# Patient Record
Sex: Female | Born: 1940
Health system: Southern US, Community
[De-identification: ages and names within clinical notes are randomized; demographics above are authoritative.]

## PROBLEM LIST (undated history)

## (undated) DIAGNOSIS — M329 Systemic lupus erythematosus, unspecified: Secondary | ICD-10-CM

## (undated) DIAGNOSIS — IMO0002 Reserved for concepts with insufficient information to code with codable children: Secondary | ICD-10-CM

## (undated) DIAGNOSIS — M1990 Unspecified osteoarthritis, unspecified site: Secondary | ICD-10-CM

## (undated) DIAGNOSIS — Z853 Personal history of malignant neoplasm of breast: Secondary | ICD-10-CM

## (undated) DIAGNOSIS — I10 Essential (primary) hypertension: Secondary | ICD-10-CM

## (undated) DIAGNOSIS — I2510 Atherosclerotic heart disease of native coronary artery without angina pectoris: Secondary | ICD-10-CM

## (undated) DIAGNOSIS — J849 Interstitial pulmonary disease, unspecified: Secondary | ICD-10-CM

## (undated) DIAGNOSIS — I2699 Other pulmonary embolism without acute cor pulmonale: Secondary | ICD-10-CM

## (undated) DIAGNOSIS — J45909 Unspecified asthma, uncomplicated: Secondary | ICD-10-CM

## (undated) DIAGNOSIS — M3500 Sicca syndrome, unspecified: Secondary | ICD-10-CM

## (undated) DIAGNOSIS — I82409 Acute embolism and thrombosis of unspecified deep veins of unspecified lower extremity: Secondary | ICD-10-CM

## (undated) DIAGNOSIS — E782 Mixed hyperlipidemia: Secondary | ICD-10-CM

## (undated) DIAGNOSIS — IMO0001 Reserved for inherently not codable concepts without codable children: Secondary | ICD-10-CM

## (undated) DIAGNOSIS — C50919 Malignant neoplasm of unspecified site of unspecified female breast: Secondary | ICD-10-CM

## (undated) DIAGNOSIS — K219 Gastro-esophageal reflux disease without esophagitis: Secondary | ICD-10-CM

## (undated) DIAGNOSIS — Z789 Other specified health status: Secondary | ICD-10-CM

## (undated) DIAGNOSIS — R55 Syncope and collapse: Secondary | ICD-10-CM

## (undated) DIAGNOSIS — E119 Type 2 diabetes mellitus without complications: Secondary | ICD-10-CM

## (undated) DIAGNOSIS — N200 Calculus of kidney: Secondary | ICD-10-CM

## (undated) DIAGNOSIS — R0902 Hypoxemia: Secondary | ICD-10-CM

## (undated) HISTORY — DX: Systemic lupus erythematosus, unspecified: M32.9

## (undated) HISTORY — PX: REPLACEMENT TOTAL KNEE: SUR1224

## (undated) HISTORY — PX: EYE SURGERY: SHX253

## (undated) HISTORY — DX: Other specified health status: Z78.9

## (undated) HISTORY — PX: COLONOSCOPY: SHX174

## (undated) HISTORY — PX: OTHER SURGICAL HISTORY: SHX169

## (undated) HISTORY — PX: CHOLECYSTECTOMY: SHX55

## (undated) HISTORY — PX: APPENDECTOMY: SHX54

## (undated) HISTORY — DX: Syncope and collapse: R55

## (undated) HISTORY — DX: Sjogren syndrome, unspecified: M35.00

## (undated) HISTORY — PX: ABDOMINAL HYSTERECTOMY: SHX81

## (undated) HISTORY — DX: Malignant neoplasm of unspecified site of unspecified female breast: C50.919

## (undated) HISTORY — DX: Reserved for concepts with insufficient information to code with codable children: IMO0002

## (undated) HISTORY — PX: BREAST LUMPECTOMY: SHX2

## (undated) HISTORY — DX: Other pulmonary embolism without acute cor pulmonale: I26.99

## (undated) HISTORY — DX: Mixed hyperlipidemia: E78.2

## (undated) HISTORY — DX: Hypoxemia: R09.02

## (undated) HISTORY — PX: CORONARY ANGIOPLASTY: SHX604

## (undated) HISTORY — DX: Essential (primary) hypertension: I10

## (undated) HISTORY — PX: CARDIAC CATHETERIZATION: SHX172

## (undated) HISTORY — DX: Personal history of malignant neoplasm of breast: Z85.3

## (undated) HISTORY — PX: ESOPHAGOGASTRODUODENOSCOPY: SHX1529

---

## 2015-12-11 ENCOUNTER — Emergency Department (HOSPITAL_COMMUNITY): Payer: Medicare Other

## 2015-12-11 ENCOUNTER — Encounter (HOSPITAL_COMMUNITY): Payer: Self-pay | Admitting: *Deleted

## 2015-12-11 ENCOUNTER — Inpatient Hospital Stay (HOSPITAL_COMMUNITY)
Admission: EM | Admit: 2015-12-11 | Discharge: 2015-12-13 | DRG: 196 | Disposition: A | Discharge status: Home / Self Care | Payer: Medicare Other | Attending: Internal Medicine | Admitting: Internal Medicine

## 2015-12-11 DIAGNOSIS — R0602 Shortness of breath: Secondary | ICD-10-CM | POA: Diagnosis not present

## 2015-12-11 DIAGNOSIS — I2510 Atherosclerotic heart disease of native coronary artery without angina pectoris: Secondary | ICD-10-CM | POA: Diagnosis present

## 2015-12-11 DIAGNOSIS — Z7982 Long term (current) use of aspirin: Secondary | ICD-10-CM

## 2015-12-11 DIAGNOSIS — J9621 Acute and chronic respiratory failure with hypoxia: Secondary | ICD-10-CM | POA: Diagnosis present

## 2015-12-11 DIAGNOSIS — I10 Essential (primary) hypertension: Secondary | ICD-10-CM

## 2015-12-11 DIAGNOSIS — Z885 Allergy status to narcotic agent status: Secondary | ICD-10-CM

## 2015-12-11 DIAGNOSIS — Z955 Presence of coronary angioplasty implant and graft: Secondary | ICD-10-CM

## 2015-12-11 DIAGNOSIS — N390 Urinary tract infection, site not specified: Secondary | ICD-10-CM | POA: Diagnosis present

## 2015-12-11 DIAGNOSIS — J45909 Unspecified asthma, uncomplicated: Secondary | ICD-10-CM | POA: Diagnosis present

## 2015-12-11 DIAGNOSIS — R0902 Hypoxemia: Secondary | ICD-10-CM | POA: Diagnosis present

## 2015-12-11 DIAGNOSIS — K219 Gastro-esophageal reflux disease without esophagitis: Secondary | ICD-10-CM | POA: Diagnosis present

## 2015-12-11 DIAGNOSIS — I82409 Acute embolism and thrombosis of unspecified deep veins of unspecified lower extremity: Secondary | ICD-10-CM | POA: Diagnosis present

## 2015-12-11 DIAGNOSIS — Z8701 Personal history of pneumonia (recurrent): Secondary | ICD-10-CM

## 2015-12-11 DIAGNOSIS — E119 Type 2 diabetes mellitus without complications: Secondary | ICD-10-CM

## 2015-12-11 DIAGNOSIS — J849 Interstitial pulmonary disease, unspecified: Principal | ICD-10-CM | POA: Diagnosis present

## 2015-12-11 DIAGNOSIS — Z7901 Long term (current) use of anticoagulants: Secondary | ICD-10-CM

## 2015-12-11 DIAGNOSIS — Z7951 Long term (current) use of inhaled steroids: Secondary | ICD-10-CM

## 2015-12-11 DIAGNOSIS — M329 Systemic lupus erythematosus, unspecified: Secondary | ICD-10-CM | POA: Diagnosis present

## 2015-12-11 DIAGNOSIS — Z86718 Personal history of other venous thrombosis and embolism: Secondary | ICD-10-CM

## 2015-12-11 DIAGNOSIS — Z7984 Long term (current) use of oral hypoglycemic drugs: Secondary | ICD-10-CM

## 2015-12-11 DIAGNOSIS — J40 Bronchitis, not specified as acute or chronic: Secondary | ICD-10-CM | POA: Diagnosis not present

## 2015-12-11 DIAGNOSIS — Z888 Allergy status to other drugs, medicaments and biological substances status: Secondary | ICD-10-CM

## 2015-12-11 DIAGNOSIS — Z79899 Other long term (current) drug therapy: Secondary | ICD-10-CM

## 2015-12-11 DIAGNOSIS — Z882 Allergy status to sulfonamides status: Secondary | ICD-10-CM

## 2015-12-11 HISTORY — DX: Acute embolism and thrombosis of unspecified deep veins of unspecified lower extremity: I82.409

## 2015-12-11 HISTORY — DX: Essential (primary) hypertension: I10

## 2015-12-11 HISTORY — DX: Type 2 diabetes mellitus without complications: E11.9

## 2015-12-11 HISTORY — DX: Atherosclerotic heart disease of native coronary artery without angina pectoris: I25.10

## 2015-12-11 LAB — URINE MICROSCOPIC-ADD ON

## 2015-12-11 LAB — COMPREHENSIVE METABOLIC PANEL
ALT: 13 U/L — ABNORMAL LOW (ref 14–54)
AST: 14 U/L — ABNORMAL LOW (ref 15–41)
Albumin: 3.4 g/dL — ABNORMAL LOW (ref 3.5–5.0)
Alkaline Phosphatase: 52 U/L (ref 38–126)
Anion gap: 10 (ref 5–15)
BUN: 13 mg/dL (ref 6–20)
CO2: 24 mmol/L (ref 22–32)
Calcium: 9 mg/dL (ref 8.9–10.3)
Chloride: 103 mmol/L (ref 101–111)
Creatinine, Ser: 0.74 mg/dL (ref 0.44–1.00)
GFR calc Af Amer: >60 mL/min (ref >60)
GFR calc non Af Amer: >60 mL/min (ref >60)
Glucose, Bld: 124 mg/dL — ABNORMAL HIGH (ref 65–99)
Potassium: 3.4 mmol/L — ABNORMAL LOW (ref 3.5–5.1)
Sodium: 137 mmol/L (ref 135–145)
Total Bilirubin: 0.6 mg/dL (ref 0.3–1.2)
Total Protein: 6.7 g/dL (ref 6.5–8.1)

## 2015-12-11 LAB — CBC WITH DIFFERENTIAL/PLATELET
Basophils Absolute: 0 10*3/uL (ref 0.0–0.1)
Basophils Relative: 0 %
Eosinophils Absolute: 0.2 10*3/uL (ref 0.0–0.7)
Eosinophils Relative: 1 %
HCT: 34.2 % — ABNORMAL LOW (ref 36.0–46.0)
Hemoglobin: 11.9 g/dL — ABNORMAL LOW (ref 12.0–15.0)
Lymphocytes Relative: 6 %
Lymphs Abs: 0.7 10*3/uL (ref 0.7–4.0)
MCH: 31.4 pg (ref 26.0–34.0)
MCHC: 34.8 g/dL (ref 30.0–36.0)
MCV: 90.2 fL (ref 78.0–100.0)
Monocytes Absolute: 1 10*3/uL (ref 0.1–1.0)
Monocytes Relative: 10 %
Neutro Abs: 8.8 10*3/uL — ABNORMAL HIGH (ref 1.7–7.7)
Neutrophils Relative %: 83 %
Platelets: 258 10*3/uL (ref 150–400)
RBC: 3.79 MIL/uL — ABNORMAL LOW (ref 3.87–5.11)
RDW: 13.2 % (ref 11.5–15.5)
WBC: 10.7 10*3/uL — ABNORMAL HIGH (ref 4.0–10.5)

## 2015-12-11 LAB — URINALYSIS, ROUTINE W REFLEX MICROSCOPIC
Bilirubin Urine: NEGATIVE
Glucose, UA: NEGATIVE mg/dL
Hgb urine dipstick: NEGATIVE
Ketones, ur: NEGATIVE mg/dL
Nitrite: NEGATIVE
Protein, ur: NEGATIVE mg/dL
Specific Gravity, Urine: 1.016 (ref 1.005–1.030)
pH: 5.5 (ref 5.0–8.0)

## 2015-12-11 LAB — I-STAT CG4 LACTIC ACID, ED
Lactic Acid, Venous: 1.07 mmol/L (ref 0.5–2.0)
Lactic Acid, Venous: 0.91 mmol/L (ref 0.5–2.0)

## 2015-12-11 LAB — I-STAT TROPONIN, ED: Troponin i, poc: 0.03 ng/mL (ref 0.00–0.08)

## 2015-12-11 LAB — CBG MONITORING, ED: Glucose-Capillary: 121 mg/dL — ABNORMAL HIGH (ref 65–99)

## 2015-12-11 LAB — LIPASE, BLOOD: Lipase: 30 U/L (ref 11–51)

## 2015-12-11 MED ORDER — METHYLPREDNISOLONE SODIUM SUCC 125 MG IJ SOLR
125.0000 mg | Freq: Once | INTRAMUSCULAR | Status: AC
  Administered 2015-12-11: 125 mg via INTRAVENOUS
  Filled 2015-12-11: qty 2

## 2015-12-11 MED ORDER — IPRATROPIUM-ALBUTEROL 0.5-2.5 (3) MG/3ML IN SOLN
3.0000 mL | Freq: Once | RESPIRATORY_TRACT | Status: AC
  Administered 2015-12-11: 3 mL via RESPIRATORY_TRACT
  Filled 2015-12-11: qty 3

## 2015-12-11 MED ORDER — IOPAMIDOL (ISOVUE-300) INJECTION 61%
100.0000 mL | Freq: Once | INTRAVENOUS | Status: AC | PRN
  Administered 2015-12-11: 100 mL via INTRAVENOUS

## 2015-12-11 MED ORDER — ONDANSETRON HCL 4 MG/2ML IJ SOLN
4.0000 mg | Freq: Once | INTRAMUSCULAR | Status: AC
  Administered 2015-12-11: 4 mg via INTRAVENOUS
  Filled 2015-12-11: qty 2

## 2015-12-11 MED ORDER — DEXTROSE 5 % IV SOLN
1.0000 g | Freq: Once | INTRAVENOUS | Status: AC
  Administered 2015-12-11: 1 g via INTRAVENOUS
  Filled 2015-12-11: qty 10

## 2015-12-11 MED ORDER — AZITHROMYCIN 500 MG IV SOLR
500.0000 mg | Freq: Once | INTRAVENOUS | Status: AC
  Administered 2015-12-11: 500 mg via INTRAVENOUS
  Filled 2015-12-11: qty 500

## 2015-12-11 MED ORDER — INSULIN ASPART 100 UNIT/ML ~~LOC~~ SOLN
0.0000 [IU] | Freq: Three times a day (TID) | SUBCUTANEOUS | Status: DC
  Administered 2015-12-12: 3 [IU] via SUBCUTANEOUS
  Administered 2015-12-12 (×2): 2 [IU] via SUBCUTANEOUS
  Administered 2015-12-13: 1 [IU] via SUBCUTANEOUS

## 2015-12-11 MED ORDER — DIATRIZOATE MEGLUMINE & SODIUM 66-10 % PO SOLN
15.0000 mL | Freq: Once | ORAL | Status: AC
  Administered 2015-12-11: 15 mL via ORAL

## 2015-12-11 MED ORDER — INSULIN ASPART 100 UNIT/ML ~~LOC~~ SOLN: 0.0000 [IU] | Freq: Every day | SUBCUTANEOUS | Status: DC

## 2015-12-11 MED ORDER — SODIUM CHLORIDE 0.9 % IV BOLUS (SEPSIS)
1000.0000 mL | Freq: Once | INTRAVENOUS | Status: AC
  Administered 2015-12-11: 1000 mL via INTRAVENOUS

## 2015-12-11 NOTE — ED Notes (Signed)
Pt ambulated for about 15 feet. O2 stats stayed between 84% and 90%.  Pt stated that she "felt short of breath".

## 2015-12-11 NOTE — ED Notes (Signed)
Pt complains of cough, abdominal pain, and fatigue. Pt states she was diagnosed with pneumonia in February and feels she has not gotten over it yet. Pt states she saw her GI doctor for left side abdominal pain and was told she had an ulcer. Pt states she also noticed blood in her urine 10 days ago and was put on cipro.

## 2015-12-11 NOTE — H&P (Signed)
[facility] Admission History and Physical       Stacy Contreras:[id number] DOB: 13-Aug-1940 DOA: 12/11/2015  Referring physician: EDP PCP:  [facility]:   Chief Complaint: Increased SOB  HPI: Stacy Contreras is a 75 y.o. female with a history of CAD, HTN, DM2, DVT, and GERD who presents to the ED with complaints of increased SOB and Cough x 5-6 months.  She has also had recent Epigastric Pain as well and was prescribed Dexilant Rx which only partially relieves her symptoms.   She was evaluated in the ED tonight and was found to have hypoxemia with O2 sats of 84%, and was placed on NCO2.   A CT scan of the chest and ABD were performed and revealed findings of Interstitial ground glass opacities in both lungs.    The findings were discussed with Pulmonary on call who recommended empiric antibiotics and IV Steroid therapy and they will see her in the AM for consultation. She was referred for admission.       Patient reports being ill with SOB and Cough since the end of December 2016, and she was diagnosed with Pneumonia by her PCP and prescribed Amoxicillin.   She reports that she only partially improved. She also had been having Epigastric Pain and Reflux symptoms and had a GI evaluation Upper Endoscopy and Colonoscopy and was placed on Dexilant Rx.      Review of Systems:  Constitutional: No Weight Loss, No Weight Gain, Night Sweats, Fevers, Chills, Dizziness, Light Headedness, Fatigue, or Generalized Weakness HEENT: No Headaches, Difficulty Swallowing,Tooth/Dental Problems,Sore Throat,  No Sneezing, Rhinitis, Ear Ache, Nasal Congestion, or Post Nasal Drip,  Cardio-vascular:  No Chest pain, Orthopnea, PND, Edema in Lower Extremities, Anasarca, Dizziness, Palpitations  Resp: +Dyspnea, No DOE, +Non-Productive Cough, No Hemoptysis, No Wheezing.    GI: No Heartburn, Indigestion, +Epigastric Abdominal Pain, Nausea, Vomiting, Diarrhea, Constipation, Hematemesis, Hematochezia,  Melena, Change in Bowel Habits,  Loss of Appetite  GU: No Dysuria, No Change in Color of Urine, No Urgency or Urinary Frequency, No Flank pain.  Musculoskeletal: No Joint Pain or Swelling, No Decreased Range of Motion, No Back Pain.  Neurologic: No Syncope, No Seizures, Muscle Weakness, Paresthesia, Vision Disturbance or Loss, No Diplopia, No Vertigo, No Difficulty Walking,  Skin: No Rash or Lesions. Psych: No Change in Mood or Affect, No Depression or Anxiety, No Memory loss, No Confusion, or Hallucinations   Past Medical History  Diagnosis Date  . Diabetes mellitus without complication (HCC)   . Coronary artery disease   . Hypertension   . DVT (deep venous thrombosis) (HCC)      History reviewed. No pertinent past surgical history.    Prior to Admission medications   Medication Sig Start Date End Date Taking? Authorizing Provider  albuterol (PROVENTIL HFA;VENTOLIN HFA) 108 (90 Base) MCG/ACT inhaler Inhale 2 puffs into the lungs every 4 (four) hours as needed for wheezing or shortness of breath.   Yes Historical Provider, MD  amLODipine (NORVASC) 5 MG tablet Take 5 mg by mouth daily.   Yes Historical Provider, MD  apixaban (ELIQUIS) 5 MG TABS tablet Take 2.5 mg by mouth 2 (two) times daily.   Yes Historical Provider, MD  aspirin EC 81 MG tablet Take 81 mg by mouth daily.   Yes Historical Provider, MD  atenolol (TENORMIN) 25 MG tablet Take 25 mg by mouth 2 (two) times daily.   Yes Historical Provider, MD  Black Cohosh 40 MG CAPS Take 120 mg  by mouth 2 (two) times daily.   Yes Historical Provider, MD  dexlansoprazole (DEXILANT) 60 MG capsule Take 60 mg by mouth daily.   Yes Historical Provider, MD  docusate sodium (COLACE) 100 MG capsule Take 200 mg by mouth daily as needed for mild constipation.   Yes Historical Provider, MD  Fluticasone-Salmeterol (ADVAIR) 250-50 MCG/DOSE AEPB Inhale 1 puff into the lungs 2 (two) times daily.   Yes Historical Provider, MD  gabapentin (NEURONTIN) 300  MG capsule Take 600 mg by mouth every evening.   Yes Historical Provider, MD  isosorbide mononitrate (IMDUR) 30 MG 24 hr tablet Take 30 mg by mouth daily.   Yes Historical Provider, MD  losartan (COZAAR) 100 MG tablet Take 100 mg by mouth at bedtime.   Yes Historical Provider, MD  lubiprostone (AMITIZA) 24 MCG capsule Take 24 mcg by mouth 2 (two) times daily with a meal.   Yes Historical Provider, MD  metFORMIN (GLUCOPHAGE) 500 MG tablet Take 1,000 mg by mouth 2 (two) times daily with a meal.   Yes Historical Provider, MD  PARoxetine (PAXIL) 20 MG tablet Take 10 mg by mouth daily.   Yes Historical Provider, MD  sitaGLIPtin (JANUVIA) 100 MG tablet Take 100 mg by mouth every evening.   Yes Historical Provider, MD  zolpidem (AMBIEN) 10 MG tablet Take 10 mg by mouth at bedtime as needed for sleep.   Yes Historical Provider, MD     Allergies  Allergen Reactions  . Codeine Other (See Comments)    Fast heart beat  . Statins     Muscle weakness  . Sulfa Antibiotics Rash    Social History:  reports that she has never smoked. She does not have any smokeless tobacco history on file. She reports that she does not drink alcohol. Her drug history is not on file.     No family history on file.     Physical Exam:  GEN:  Pleasant Well Nourished and Well Developed  75 y.o. Caucasian female examined and in no acute distress; cooperative with exam Filed Vitals:   12/11/15 1805 12/11/15 1910 12/11/15 2058 12/11/15 2248  BP: 129/63 133/56 135/65 142/70  Pulse: 76 81 78 84  Temp: 98.1 F (36.7 C) 98.8 F (37.1 C)    TempSrc:  Oral    Resp: 27 18 26 21   Height: 5\' 5"  (1.651 m)     Weight: 65.46 kg (144 lb 5 oz)     SpO2: 93% 100% 95% 97%   Blood pressure 142/70, pulse 84, temperature 98.8 F (37.1 C), temperature source Oral, resp. rate 21, height 5\' 5"  (1.651 m), weight 65.46 kg (144 lb 5 oz), SpO2 97 %. PSYCH: She is alert and oriented x4; does not appear anxious does not appear depressed;  affect is normal HEENT: Normocephalic and Atraumatic, Mucous membranes pink; PERRLA; EOM intact; Fundi:  Benign;  No scleral icterus, Nares: Patent, Oropharynx: Clear,  Fair Dentition,    Neck:  FROM, No Cervical Lymphadenopathy nor Thyromegaly or Carotid Bruit; No JVD; Breasts:: Not examined CHEST WALL: No tenderness CHEST: Normal respiration, clear to auscultation bilaterally HEART: Regular rate and rhythm; no murmurs rubs or gallops BACK: No kyphosis or scoliosis; No CVA tenderness ABDOMEN: Positive Bowel Sounds, Soft Non-Tender, No Rebound or Guarding; No Masses, No Organomegaly Rectal Exam: Not done EXTREMITIES: No Cyanosis, Clubbing, or Edema; No Ulcerations. Genitalia: not examined PULSES: 2+ and symmetric SKIN: Normal hydration no rash or ulceration CNS:  Alert and Oriented x 4, No Focal  Deficits Vascular: pulses palpable throughout    Labs on Admission:  Basic Metabolic Panel:  Recent Labs Lab 12/11/15 1806  NA 137  K 3.4*  CL 103  CO2 24  GLUCOSE 124*  BUN 13  CREATININE 0.74  CALCIUM 9.0   Liver Function Tests:  Recent Labs Lab 12/11/15 1806  AST 14*  ALT 13*  ALKPHOS 52  BILITOT 0.6  PROT 6.7  ALBUMIN 3.4*    Recent Labs Lab 12/11/15 1805  LIPASE 30   No results for input(s): AMMONIA in the last 168 hours. CBC:  Recent Labs Lab 12/11/15 1805  WBC 10.7*  NEUTROABS 8.8*  HGB 11.9*  HCT 34.2*  MCV 90.2  PLT 258   Cardiac Enzymes: No results for input(s): CKTOTAL, CKMB, CKMBINDEX, TROPONINI in the last 168 hours.  BNP (last 3 results) No results for input(s): BNP in the last 8760 hours.  ProBNP (last 3 results) No results for input(s): PROBNP in the last 8760 hours.  CBG: No results for input(s): GLUCAP in the last 168 hours.  Radiological Exams on Admission: Dg Chest 2 View  12/11/2015  CLINICAL DATA:  Pneumonia. Productive cough and shortness of breath. History of asthma. EXAM: CHEST  2 VIEW COMPARISON:  None. FINDINGS: Normal  cardiac silhouette and mediastinal contours with atherosclerotic plaque within the thoracic aorta. Calcified lymph nodes are noted about the bilateral hila, left greater than right with scattered bilateral punctate granuloma. There is mild diffuse slightly nodular thickening of the pulmonary interstitium. No focal airspace opacities. There is minimal pleural parenchymal thickening about the right minor and bilateral major fissures. No pleural effusion or pneumothorax. No evidence of edema. No acute osseus abnormalities. Post cholecystectomy. IMPRESSION: 1. Findings suggestive of airways disease / bronchitis. No focal airspace opacities to suggest pneumonia. 2. Sequela of prior granulomatous infection. Electronically Signed   By: Simonne Come M.D.   On: 12/11/2015 17:47   Ct Chest W Contrast  12/11/2015  CLINICAL DATA:  75 year old with approximate 3 month history of cough and recent onset of left-sided abdominal pain and hematuria. EXAM: CT CHEST, ABDOMEN, AND PELVIS WITH CONTRAST TECHNIQUE: Multidetector CT imaging of the chest, abdomen and pelvis was performed following the standard protocol during bolus administration of intravenous contrast. CONTRAST:  ISOVUE-300 IOPAMIDOL 61% IV. Oral contrast was also administered. COMPARISON:  None. FINDINGS: CT CHEST FINDINGS Cardiovascular: Heart mildly enlarged. Severe LAD coronary atherosclerosis. No pericardial effusion. Severe atherosclerosis involving the thoracic aorta without evidence of aneurysm. Atherosclerosis at the origin of the left subclavian artery without significant stenosis. Mediastinum/Lymph Nodes: Numerous calcified lymph nodes throughout the mediastinum. Noncalcified prevascular lymph nodes of normal size. No pathologic lymphadenopathy. Normal esophagus. No mediastinal masses. Visualized thyroid gland unremarkable. Lungs/Pleura: Ground-glass airspace opacities throughout both lungs, associated with interstitial opacities throughout both lungs  which sure most prominent in the lower lobes. No solid nodules or masses. No pleural effusions. Musculoskeletal: Degenerative disc disease and spondylosis at T9-10. No acute abnormalities. CT ABDOMEN PELVIS FINDINGS Hepatobiliary: Mild diffuse hepatic steatosis without focal hepatic parenchymal abnormality. Gallbladder surgically absent. No biliary ductal dilation. Pancreas: Normal in appearance without evidence of mass, ductal dilation, or inflammation. Spleen: Normal in size and appearance. Adrenals/Urinary Tract: Approximate 1.1 x 1.7 x 1.3 cm low-attenuation nodule arising from the left adrenal gland. Normal right adrenal gland. Bilateral extrarenal pelves. Subcentimeter simple cyst arising from the lower pole of the left kidney. No significant focal parenchymal abnormality involving either kidney. No urinary tract calculi or obstruction. Normal-appearing urinary bladder.  Stomach/Bowel: Stomach normal in appearance for the degree of distention. Normal-appearing small bowel. Mobile cecum positioned in the right upper quadrant. Entire colon decompressed containing liquid stool. Scattered sigmoid colon diverticula without evidence of acute diverticulitis. Appendix not visualized but no pericecal inflammation. Vascular/Lymphatic: Severe aortoiliofemoral atherosclerosis without aneurysm. Visceral arteries atherosclerotic though patent. No pathologic lymphadenopathy. Reproductive: Surgically absent uterus.  No adnexal masses. Other: Numerous pelvic phleboliths. Very small umbilical hernia containing fat. Musculoskeletal: Severe degenerative disc disease L5-S1 with chronic disc protrusion as there is calcification in the posterior annular fibers. IMPRESSION: 1. Ground-glass opacities throughout both lungs associated with interstitial opacities, a nonspecific finding which can be seen in patients with pulmonary edema or with acute pulmonary inflammatory disorders. 2. No acute abnormalities involving the abdomen or  pelvis. 3. 1.7 cm left adrenal nodule, statistically a small adenoma. 4. Sigmoid colon diverticulosis without evidence of acute diverticulitis. 5. Mild diffuse hepatic steatosis. Electronically Signed   By: Hulan Saashomas  Lawrence M.D.   On: 12/11/2015 19:55   Ct Abdomen Pelvis W Contrast  12/11/2015  CLINICAL DATA:  75 year old with approximate 3 month history of cough and recent onset of left-sided abdominal pain and hematuria. EXAM: CT CHEST, ABDOMEN, AND PELVIS WITH CONTRAST TECHNIQUE: Multidetector CT imaging of the chest, abdomen and pelvis was performed following the standard protocol during bolus administration of intravenous contrast. CONTRAST:  100mL ISOVUE-300 IOPAMIDOL 61% IV. Oral contrast was also administered. COMPARISON:  None. FINDINGS: CT CHEST FINDINGS Cardiovascular: Heart mildly enlarged. Severe LAD coronary atherosclerosis. No pericardial effusion. Severe atherosclerosis involving the thoracic aorta without evidence of aneurysm. Atherosclerosis at the origin of the left subclavian artery without significant stenosis. Mediastinum/Lymph Nodes: Numerous calcified lymph nodes throughout the mediastinum. Noncalcified prevascular lymph nodes of normal size. No pathologic lymphadenopathy. Normal esophagus. No mediastinal masses. Visualized thyroid gland unremarkable. Lungs/Pleura: Ground-glass airspace opacities throughout both lungs, associated with interstitial opacities throughout both lungs which sure most prominent in the lower lobes. No solid nodules or masses. No pleural effusions. Musculoskeletal: Degenerative disc disease and spondylosis at T9-10. No acute abnormalities. CT ABDOMEN PELVIS FINDINGS Hepatobiliary: Mild diffuse hepatic steatosis without focal hepatic parenchymal abnormality. Gallbladder surgically absent. No biliary ductal dilation. Pancreas: Normal in appearance without evidence of mass, ductal dilation, or inflammation. Spleen: Normal in size and appearance. Adrenals/Urinary Tract:  Approximate 1.1 x 1.7 x 1.3 cm low-attenuation nodule arising from the left adrenal gland. Normal right adrenal gland. Bilateral extrarenal pelves. Subcentimeter simple cyst arising from the lower pole of the left kidney. No significant focal parenchymal abnormality involving either kidney. No urinary tract calculi or obstruction. Normal-appearing urinary bladder. Stomach/Bowel: Stomach normal in appearance for the degree of distention. Normal-appearing small bowel. Mobile cecum positioned in the right upper quadrant. Entire colon decompressed containing liquid stool. Scattered sigmoid colon diverticula without evidence of acute diverticulitis. Appendix not visualized but no pericecal inflammation. Vascular/Lymphatic: Severe aortoiliofemoral atherosclerosis without aneurysm. Visceral arteries atherosclerotic though patent. No pathologic lymphadenopathy. Reproductive: Surgically absent uterus.  No adnexal masses. Other: Numerous pelvic phleboliths. Very small umbilical hernia containing fat. Musculoskeletal: Severe degenerative disc disease L5-S1 with chronic disc protrusion as there is calcification in the posterior annular fibers. IMPRESSION: 1. Ground-glass opacities throughout both lungs associated with interstitial opacities, a nonspecific finding which can be seen in patients with pulmonary edema or with acute pulmonary inflammatory disorders. 2. No acute abnormalities involving the abdomen or pelvis. 3. 1.7 cm left adrenal nodule, statistically a small adenoma. 4. Sigmoid colon diverticulosis without evidence of acute diverticulitis. 5. Mild diffuse hepatic  steatosis. Electronically Signed   By: Hulan Saas M.D.   On: 12/11/2015 19:55     EKG: Independently reviewed.     Assessment/Plan:      75 y.o. female with  Principal Problem:    SOB (shortness of breath)  Due to Chronic Inflammation, Pulmonary Fibrosis/ILD vs Bronchiectasis   NCO2 PRN   Monitor O2 sats IV Steroids   Empiric IV Rocephin  and Azithromycin   DuoNebs PRN   Pulmonary to see in AM   Active Problems:    Hypoxia- due to Inflammatory Process   NCO2 PRN   Monitor O2 sats      Bronchitis vs CAP   Empiric Antibiotic Rx    UTI (lower urinary tract infection)   Covered by IV Rocephin        Coronary artery disease   Stable   Continue Imdur, Atenolol, Losartan Rx      Diabetes mellitus without complication (HCC)   Hold Oral Hypoglycemic Rx   SSI coverage PRN   Check HbA1C in AM      Hypertension   Continue Amlodipine, Atenolol, and Losartan Rx   Monitor BPs       DVT (deep venous thrombosis) (HCC)   On Eliquis Rx    Code Status:     FULL CODE       Family Communication:   Husband at Bedside   Disposition Plan:    Inpatient Status        Time spent: 78 Minutes      Ron Parker [facility] Pager [phone]   If 7AM -7PM Please Contact the Day Rounding Team MD for [facility]  If 7PM-7AM, Please Contact Night-Floor Coverage  [email or web address] Password [email or web address] 12/11/2015, 11:05 PM     ADDENDUM:   Patient was seen and examined on 12/11/2015

## 2015-12-11 NOTE — ED Notes (Signed)
Made first request for a urine sample, pt unable to provide one at this time. 

## 2015-12-11 NOTE — ED Provider Notes (Signed)
CSN: [id number]     Arrival date & time 12/11/15  1621 History   First MD Initiated Contact with Patient 12/11/15 1725     Chief Complaint  Patient presents with  . Abdominal Pain  . Cough     (Consider location/radiation/quality/duration/timing/severity/associated sxs/prior Treatment) The history is provided by the patient.  Stacy Contreras is a 75 y.o. female hx of DM, HTN, CAD Here presenting with cough, abdominal pain, fatigue. She actually lives at [place] in [place]. She has not been feeling well since December. In February, she was diagnosed with pneumonia and finished a course of amoxicillin. However, patient still has persistent left sided chest pain and occasional shortness of breath. She has been taking her Eliquis. She also has persistent left upper quadrant abdominal pain as well as epigastric pain. Seen her GI doctor up in [place] and had a recent endoscopy that was unremarkable and was thought to have some gastritis so was started on Dexilant. She states that dexilant helps with her nausea but she still has abdominal pain. About 2 weeks ago she had an episode of hematuria and was put on Cipro and the hematuria resolved. For the last 2 days, patient has some low-grade temperature and subjective chills and wanted to come down here for evaluation. No recent CT scans or chest x-rays done.           Past Medical History  Diagnosis Date  . Diabetes mellitus without complication (HCC)   . Coronary artery disease   . Hypertension   . DVT (deep venous thrombosis) (HCC)    History reviewed. No pertinent past surgical history. No family history on file. Social History  Substance Use Topics  . Smoking status: Never Smoker   . Smokeless tobacco: None  . Alcohol Use: No   OB History    No data available     Review of Systems  Respiratory: Positive for cough.   Gastrointestinal: Positive for abdominal pain.  Neurological: Positive for weakness.  All other systems reviewed and  are negative.     Allergies  Codeine; Statins; and Sulfa antibiotics  Home Medications   Prior to Admission medications   Medication Sig Start Date End Date Taking? Authorizing Provider  albuterol (PROVENTIL HFA;VENTOLIN HFA) 108 (90 Base) MCG/ACT inhaler Inhale 2 puffs into the lungs every 4 (four) hours as needed for wheezing or shortness of breath.   Yes Historical Provider, MD  amLODipine (NORVASC) 5 MG tablet Take 5 mg by mouth daily.   Yes Historical Provider, MD  apixaban (ELIQUIS) 5 MG TABS tablet Take 2.5 mg by mouth 2 (two) times daily.   Yes Historical Provider, MD  aspirin EC 81 MG tablet Take 81 mg by mouth daily.   Yes Historical Provider, MD  atenolol (TENORMIN) 25 MG tablet Take 25 mg by mouth 2 (two) times daily.   Yes Historical Provider, MD  Black Cohosh 40 MG CAPS Take 120 mg by mouth 2 (two) times daily.   Yes Historical Provider, MD  dexlansoprazole (DEXILANT) 60 MG capsule Take 60 mg by mouth daily.   Yes Historical Provider, MD  docusate sodium (COLACE) 100 MG capsule Take 200 mg by mouth daily as needed for mild constipation.   Yes Historical Provider, MD  Fluticasone-Salmeterol (ADVAIR) 250-50 MCG/DOSE AEPB Inhale 1 puff into the lungs 2 (two) times daily.   Yes Historical Provider, MD  gabapentin (NEURONTIN) 300 MG capsule Take 600 mg by mouth every evening.   Yes Historical Provider, MD  isosorbide  mononitrate (IMDUR) 30 MG 24 hr tablet Take 30 mg by mouth daily.   Yes Historical Provider, MD  losartan (COZAAR) 100 MG tablet Take 100 mg by mouth at bedtime.   Yes Historical Provider, MD  lubiprostone (AMITIZA) 24 MCG capsule Take 24 mcg by mouth 2 (two) times daily with a meal.   Yes Historical Provider, MD  metFORMIN (GLUCOPHAGE) 500 MG tablet Take 1,000 mg by mouth 2 (two) times daily with a meal.   Yes Historical Provider, MD  PARoxetine (PAXIL) 20 MG tablet Take 10 mg by mouth daily.   Yes Historical Provider, MD  sitaGLIPtin (JANUVIA) 100 MG tablet Take 100  mg by mouth every evening.   Yes Historical Provider, MD  zolpidem (AMBIEN) 10 MG tablet Take 10 mg by mouth at bedtime as needed for sleep.   Yes Historical Provider, MD   BP 142/70 mmHg  Pulse 84  Temp(Src) 98.8 F (37.1 C) (Oral)  Resp 21  Ht 5\' 5"  (1.651 m)  Wt 144 lb 5 oz (65.46 kg)  BMI 24.01 kg/m2  SpO2 97% Physical Exam  Constitutional: She is oriented to person, place, and time. She appears well-developed and well-nourished.  HENT:  Head: Normocephalic.  Mouth/Throat: Oropharynx is clear and moist.  Eyes: Conjunctivae are normal. Pupils are equal, round, and reactive to light.  Neck: Normal range of motion. Neck supple.  Cardiovascular: Normal rate, regular rhythm and normal heart sounds.   Pulmonary/Chest: Effort normal.  Diminished L base   Abdominal: Soft. Bowel sounds are normal.  + epigastric and LUQ tenderness, no CVAT   Musculoskeletal: Normal range of motion. She exhibits no edema or tenderness.  Neurological: She is alert and oriented to person, place, and time. No cranial nerve deficit. Coordination normal.  Skin: Skin is warm and dry.  Psychiatric: She has a normal mood and affect. Her behavior is normal. Judgment and thought content normal.  Nursing note and vitals reviewed.   ED Course  Procedures (including critical care time) Labs Review Labs Reviewed  URINALYSIS, ROUTINE W REFLEX MICROSCOPIC (NOT AT [facility]) - Abnormal; Notable for the following:    APPearance CLOUDY (*)    Leukocytes, UA SMALL (*)    All other components within normal limits  CBC WITH DIFFERENTIAL/PLATELET - Abnormal; Notable for the following:    WBC 10.7 (*)    RBC 3.79 (*)    Hemoglobin 11.9 (*)    HCT 34.2 (*)    Neutro Abs 8.8 (*)    All other components within normal limits  COMPREHENSIVE METABOLIC PANEL - Abnormal; Notable for the following:    Potassium 3.4 (*)    Glucose, Bld 124 (*)    Albumin 3.4 (*)    AST 14 (*)    ALT 13 (*)    All other components within normal  limits  URINE MICROSCOPIC-ADD ON - Abnormal; Notable for the following:    Squamous Epithelial / LPF 0-5 (*)    Bacteria, UA RARE (*)    All other components within normal limits  LIPASE, BLOOD  I-STAT TROPOININ, ED  I-STAT CG4 LACTIC ACID, ED  I-STAT CG4 LACTIC ACID, ED    Imaging Review Dg Chest 2 View  12/11/2015  CLINICAL DATA:  Pneumonia. Productive cough and shortness of breath. History of asthma. EXAM: CHEST  2 VIEW COMPARISON:  None. FINDINGS: Normal cardiac silhouette and mediastinal contours with atherosclerotic plaque within the thoracic aorta. Calcified lymph nodes are noted about the bilateral hila, left greater than right with  scattered bilateral punctate granuloma. There is mild diffuse slightly nodular thickening of the pulmonary interstitium. No focal airspace opacities. There is minimal pleural parenchymal thickening about the right minor and bilateral major fissures. No pleural effusion or pneumothorax. No evidence of edema. No acute osseus abnormalities. Post cholecystectomy. IMPRESSION: 1. Findings suggestive of airways disease / bronchitis. No focal airspace opacities to suggest pneumonia. 2. Sequela of prior granulomatous infection. Electronically Signed   By: Simonne Come M.D.   On: 12/11/2015 17:47   Ct Chest W Contrast  12/11/2015  CLINICAL DATA:  75 year old with approximate 3 month history of cough and recent onset of left-sided abdominal pain and hematuria. EXAM: CT CHEST, ABDOMEN, AND PELVIS WITH CONTRAST TECHNIQUE: Multidetector CT imaging of the chest, abdomen and pelvis was performed following the standard protocol during bolus administration of intravenous contrast. CONTRAST:  ISOVUE-300 IOPAMIDOL 61% IV. Oral contrast was also administered. COMPARISON:  None. FINDINGS: CT CHEST FINDINGS Cardiovascular: Heart mildly enlarged. Severe LAD coronary atherosclerosis. No pericardial effusion. Severe atherosclerosis involving the thoracic aorta without evidence of  aneurysm. Atherosclerosis at the origin of the left subclavian artery without significant stenosis. Mediastinum/Lymph Nodes: Numerous calcified lymph nodes throughout the mediastinum. Noncalcified prevascular lymph nodes of normal size. No pathologic lymphadenopathy. Normal esophagus. No mediastinal masses. Visualized thyroid gland unremarkable. Lungs/Pleura: Ground-glass airspace opacities throughout both lungs, associated with interstitial opacities throughout both lungs which sure most prominent in the lower lobes. No solid nodules or masses. No pleural effusions. Musculoskeletal: Degenerative disc disease and spondylosis at T9-10. No acute abnormalities. CT ABDOMEN PELVIS FINDINGS Hepatobiliary: Mild diffuse hepatic steatosis without focal hepatic parenchymal abnormality. Gallbladder surgically absent. No biliary ductal dilation. Pancreas: Normal in appearance without evidence of mass, ductal dilation, or inflammation. Spleen: Normal in size and appearance. Adrenals/Urinary Tract: Approximate 1.1 x 1.7 x 1.3 cm low-attenuation nodule arising from the left adrenal gland. Normal right adrenal gland. Bilateral extrarenal pelves. Subcentimeter simple cyst arising from the lower pole of the left kidney. No significant focal parenchymal abnormality involving either kidney. No urinary tract calculi or obstruction. Normal-appearing urinary bladder. Stomach/Bowel: Stomach normal in appearance for the degree of distention. Normal-appearing small bowel. Mobile cecum positioned in the right upper quadrant. Entire colon decompressed containing liquid stool. Scattered sigmoid colon diverticula without evidence of acute diverticulitis. Appendix not visualized but no pericecal inflammation. Vascular/Lymphatic: Severe aortoiliofemoral atherosclerosis without aneurysm. Visceral arteries atherosclerotic though patent. No pathologic lymphadenopathy. Reproductive: Surgically absent uterus.  No adnexal masses. Other: Numerous pelvic  phleboliths. Very small umbilical hernia containing fat. Musculoskeletal: Severe degenerative disc disease L5-S1 with chronic disc protrusion as there is calcification in the posterior annular fibers. IMPRESSION: 1. Ground-glass opacities throughout both lungs associated with interstitial opacities, a nonspecific finding which can be seen in patients with pulmonary edema or with acute pulmonary inflammatory disorders. 2. No acute abnormalities involving the abdomen or pelvis. 3. 1.7 cm left adrenal nodule, statistically a small adenoma. 4. Sigmoid colon diverticulosis without evidence of acute diverticulitis. 5. Mild diffuse hepatic steatosis. Electronically Signed   By: Hulan Saas M.D.   On: 12/11/2015 19:55   Ct Abdomen Pelvis W Contrast  12/11/2015  CLINICAL DATA:  75 year old with approximate 3 month history of cough and recent onset of left-sided abdominal pain and hematuria. EXAM: CT CHEST, ABDOMEN, AND PELVIS WITH CONTRAST TECHNIQUE: Multidetector CT imaging of the chest, abdomen and pelvis was performed following the standard protocol during bolus administration of intravenous contrast. CONTRAST:  ISOVUE-300 IOPAMIDOL 61% IV. Oral contrast was also administered.  COMPARISON:  None. FINDINGS: CT CHEST FINDINGS Cardiovascular: Heart mildly enlarged. Severe LAD coronary atherosclerosis. No pericardial effusion. Severe atherosclerosis involving the thoracic aorta without evidence of aneurysm. Atherosclerosis at the origin of the left subclavian artery without significant stenosis. Mediastinum/Lymph Nodes: Numerous calcified lymph nodes throughout the mediastinum. Noncalcified prevascular lymph nodes of normal size. No pathologic lymphadenopathy. Normal esophagus. No mediastinal masses. Visualized thyroid gland unremarkable. Lungs/Pleura: Ground-glass airspace opacities throughout both lungs, associated with interstitial opacities throughout both lungs which sure most prominent in the lower lobes. No  solid nodules or masses. No pleural effusions. Musculoskeletal: Degenerative disc disease and spondylosis at T9-10. No acute abnormalities. CT ABDOMEN PELVIS FINDINGS Hepatobiliary: Mild diffuse hepatic steatosis without focal hepatic parenchymal abnormality. Gallbladder surgically absent. No biliary ductal dilation. Pancreas: Normal in appearance without evidence of mass, ductal dilation, or inflammation. Spleen: Normal in size and appearance. Adrenals/Urinary Tract: Approximate 1.1 x 1.7 x 1.3 cm low-attenuation nodule arising from the left adrenal gland. Normal right adrenal gland. Bilateral extrarenal pelves. Subcentimeter simple cyst arising from the lower pole of the left kidney. No significant focal parenchymal abnormality involving either kidney. No urinary tract calculi or obstruction. Normal-appearing urinary bladder. Stomach/Bowel: Stomach normal in appearance for the degree of distention. Normal-appearing small bowel. Mobile cecum positioned in the right upper quadrant. Entire colon decompressed containing liquid stool. Scattered sigmoid colon diverticula without evidence of acute diverticulitis. Appendix not visualized but no pericecal inflammation. Vascular/Lymphatic: Severe aortoiliofemoral atherosclerosis without aneurysm. Visceral arteries atherosclerotic though patent. No pathologic lymphadenopathy. Reproductive: Surgically absent uterus.  No adnexal masses. Other: Numerous pelvic phleboliths. Very small umbilical hernia containing fat. Musculoskeletal: Severe degenerative disc disease L5-S1 with chronic disc protrusion as there is calcification in the posterior annular fibers. IMPRESSION: 1. Ground-glass opacities throughout both lungs associated with interstitial opacities, a nonspecific finding which can be seen in patients with pulmonary edema or with acute pulmonary inflammatory disorders. 2. No acute abnormalities involving the abdomen or pelvis. 3. 1.7 cm left adrenal nodule, statistically a  small adenoma. 4. Sigmoid colon diverticulosis without evidence of acute diverticulitis. 5. Mild diffuse hepatic steatosis. Electronically Signed   By: Hulan Saas M.D.   On: 12/11/2015 19:55   I have personally reviewed and evaluated these images and lab results as part of my medical decision-making.   EKG Interpretation   Date/Time:  Wednesday Dec 11 2015 18:04:02 EDT Ventricular Rate:  73 PR Interval:  161 QRS Duration: 87 QT Interval:  477 QTC Calculation: 526 R Axis:   48 Text Interpretation:  Sinus rhythm Abnormal R-wave progression, early  transition Borderline T abnormalities, anterior leads Prolonged QT  interval No significant change was found Confirmed by YAO  MD, DAVID  ([id number]) on 12/11/2015 6:49:59 PM      MDM   Final diagnoses:  None    Stacy Contreras is a 75 y.o. female here with cough, abdominal pain for several months but chills yesterday. Consider pneumonia vs bronchitis vs intra abdominal process. Will get CXR, CT ab/pel, labs, UA.   10:49 PM Patient desat to 82% on RA while ambulating and gets short of breath. CT showed interstitial opacities. CT ab/pel unremarkable. UA ? UTI. Given ceftriaxone. Consulted Dr. Arsenio Loader from critical care, who recommend azithro and IV steroids. Will admit for hypoxia likely secondary to interstitial lung disease vs atypical pneumonia and pyelo.    Richardean Canal, MD 12/11/15 2251

## 2015-12-12 ENCOUNTER — Observation Stay (HOSPITAL_BASED_OUTPATIENT_CLINIC_OR_DEPARTMENT_OTHER): Payer: Medicare Other

## 2015-12-12 DIAGNOSIS — Z7901 Long term (current) use of anticoagulants: Secondary | ICD-10-CM | POA: Diagnosis not present

## 2015-12-12 DIAGNOSIS — Z86718 Personal history of other venous thrombosis and embolism: Secondary | ICD-10-CM | POA: Diagnosis not present

## 2015-12-12 DIAGNOSIS — Z7984 Long term (current) use of oral hypoglycemic drugs: Secondary | ICD-10-CM | POA: Diagnosis not present

## 2015-12-12 DIAGNOSIS — Z7951 Long term (current) use of inhaled steroids: Secondary | ICD-10-CM | POA: Diagnosis not present

## 2015-12-12 DIAGNOSIS — J849 Interstitial pulmonary disease, unspecified: Principal | ICD-10-CM

## 2015-12-12 DIAGNOSIS — Z882 Allergy status to sulfonamides status: Secondary | ICD-10-CM | POA: Diagnosis not present

## 2015-12-12 DIAGNOSIS — R0902 Hypoxemia: Secondary | ICD-10-CM | POA: Diagnosis present

## 2015-12-12 DIAGNOSIS — Z955 Presence of coronary angioplasty implant and graft: Secondary | ICD-10-CM | POA: Diagnosis not present

## 2015-12-12 DIAGNOSIS — J9621 Acute and chronic respiratory failure with hypoxia: Secondary | ICD-10-CM | POA: Diagnosis present

## 2015-12-12 DIAGNOSIS — N390 Urinary tract infection, site not specified: Secondary | ICD-10-CM | POA: Diagnosis present

## 2015-12-12 DIAGNOSIS — J80 Acute respiratory distress syndrome: Secondary | ICD-10-CM

## 2015-12-12 DIAGNOSIS — I10 Essential (primary) hypertension: Secondary | ICD-10-CM | POA: Diagnosis present

## 2015-12-12 DIAGNOSIS — J40 Bronchitis, not specified as acute or chronic: Secondary | ICD-10-CM | POA: Diagnosis present

## 2015-12-12 DIAGNOSIS — Z7982 Long term (current) use of aspirin: Secondary | ICD-10-CM | POA: Diagnosis not present

## 2015-12-12 DIAGNOSIS — E119 Type 2 diabetes mellitus without complications: Secondary | ICD-10-CM | POA: Diagnosis present

## 2015-12-12 DIAGNOSIS — I2510 Atherosclerotic heart disease of native coronary artery without angina pectoris: Secondary | ICD-10-CM | POA: Diagnosis present

## 2015-12-12 DIAGNOSIS — Z885 Allergy status to narcotic agent status: Secondary | ICD-10-CM | POA: Diagnosis not present

## 2015-12-12 DIAGNOSIS — M329 Systemic lupus erythematosus, unspecified: Secondary | ICD-10-CM | POA: Diagnosis present

## 2015-12-12 DIAGNOSIS — I82409 Acute embolism and thrombosis of unspecified deep veins of unspecified lower extremity: Secondary | ICD-10-CM | POA: Diagnosis present

## 2015-12-12 DIAGNOSIS — Z79899 Other long term (current) drug therapy: Secondary | ICD-10-CM | POA: Diagnosis not present

## 2015-12-12 DIAGNOSIS — Z8701 Personal history of pneumonia (recurrent): Secondary | ICD-10-CM | POA: Diagnosis not present

## 2015-12-12 DIAGNOSIS — K219 Gastro-esophageal reflux disease without esophagitis: Secondary | ICD-10-CM | POA: Diagnosis present

## 2015-12-12 DIAGNOSIS — Z888 Allergy status to other drugs, medicaments and biological substances status: Secondary | ICD-10-CM | POA: Diagnosis not present

## 2015-12-12 DIAGNOSIS — R0602 Shortness of breath: Secondary | ICD-10-CM | POA: Diagnosis present

## 2015-12-12 DIAGNOSIS — J45909 Unspecified asthma, uncomplicated: Secondary | ICD-10-CM | POA: Diagnosis present

## 2015-12-12 LAB — CBC
HCT: 34.5 % — ABNORMAL LOW (ref 36.0–46.0)
Hemoglobin: 11.9 g/dL — ABNORMAL LOW (ref 12.0–15.0)
MCH: 31.1 pg (ref 26.0–34.0)
MCHC: 34.5 g/dL (ref 30.0–36.0)
MCV: 90.1 fL (ref 78.0–100.0)
Platelets: 265 10*3/uL (ref 150–400)
RBC: 3.83 MIL/uL — ABNORMAL LOW (ref 3.87–5.11)
RDW: 13.1 % (ref 11.5–15.5)
WBC: 7 10*3/uL (ref 4.0–10.5)

## 2015-12-12 LAB — BASIC METABOLIC PANEL
Anion gap: 10 (ref 5–15)
BUN: 9 mg/dL (ref 6–20)
CO2: 25 mmol/L (ref 22–32)
Calcium: 9 mg/dL (ref 8.9–10.3)
Chloride: 107 mmol/L (ref 101–111)
Creatinine, Ser: 0.68 mg/dL (ref 0.44–1.00)
GFR calc Af Amer: >60 mL/min (ref >60)
GFR calc non Af Amer: >60 mL/min (ref >60)
Glucose, Bld: 207 mg/dL — ABNORMAL HIGH (ref 65–99)
Potassium: 4.3 mmol/L (ref 3.5–5.1)
Sodium: 142 mmol/L (ref 135–145)

## 2015-12-12 LAB — CK TOTAL AND CKMB (NOT AT ARMC)
CK, MB: 8.2 ng/mL — ABNORMAL HIGH (ref 0.5–5.0)
Relative Index: INVALID (ref 0.0–2.5)
Total CK: 13 U/L — ABNORMAL LOW (ref 38–234)

## 2015-12-12 LAB — GLUCOSE, CAPILLARY
Glucose-Capillary: 196 mg/dL — ABNORMAL HIGH (ref 65–99)
Glucose-Capillary: 236 mg/dL — ABNORMAL HIGH (ref 65–99)
Glucose-Capillary: 165 mg/dL — ABNORMAL HIGH (ref 65–99)
Glucose-Capillary: 190 mg/dL — ABNORMAL HIGH (ref 65–99)

## 2015-12-12 LAB — ECHOCARDIOGRAM COMPLETE
Height: 65 in
Weight: 2309 oz

## 2015-12-12 LAB — PROCALCITONIN: Procalcitonin: <0.1 ng/mL

## 2015-12-12 LAB — LACTIC ACID, PLASMA: Lactic Acid, Venous: 1.2 mmol/L (ref 0.5–2.0)

## 2015-12-12 LAB — TROPONIN I: Troponin I: <0.03 ng/mL (ref <0.031)

## 2015-12-12 LAB — BRAIN NATRIURETIC PEPTIDE: B Natriuretic Peptide: 104.4 pg/mL — ABNORMAL HIGH (ref 0.0–100.0)

## 2015-12-12 MED ORDER — PANTOPRAZOLE SODIUM 40 MG PO TBEC: 40.0000 mg | DELAYED_RELEASE_TABLET | Freq: Every day | ORAL | Status: DC

## 2015-12-12 MED ORDER — ONDANSETRON HCL 4 MG/2ML IJ SOLN: 4.0000 mg | Freq: Four times a day (QID) | INTRAMUSCULAR | Status: DC | PRN

## 2015-12-12 MED ORDER — IPRATROPIUM-ALBUTEROL 0.5-2.5 (3) MG/3ML IN SOLN
3.0000 mL | Freq: Two times a day (BID) | RESPIRATORY_TRACT | Status: DC
  Administered 2015-12-12 (×2): 3 mL via RESPIRATORY_TRACT
  Filled 2015-12-12 (×2): qty 3

## 2015-12-12 MED ORDER — AZITHROMYCIN 500 MG IV SOLR
500.0000 mg | INTRAVENOUS | Status: DC
  Filled 2015-12-12: qty 500

## 2015-12-12 MED ORDER — ACETAMINOPHEN 325 MG PO TABS
650.0000 mg | ORAL_TABLET | Freq: Four times a day (QID) | ORAL | Status: DC | PRN
  Administered 2015-12-13: 650 mg via ORAL
  Filled 2015-12-12: qty 2

## 2015-12-12 MED ORDER — ATENOLOL 25 MG PO TABS
25.0000 mg | ORAL_TABLET | Freq: Two times a day (BID) | ORAL | Status: DC
  Administered 2015-12-12 – 2015-12-13 (×4): 25 mg via ORAL
  Filled 2015-12-12 (×5): qty 1

## 2015-12-12 MED ORDER — SODIUM CHLORIDE 0.9% FLUSH: 3.0000 mL | INTRAVENOUS | Status: DC | PRN

## 2015-12-12 MED ORDER — SODIUM CHLORIDE 0.9 % IV SOLN: 250.0000 mL | INTRAVENOUS | Status: DC | PRN

## 2015-12-12 MED ORDER — SODIUM CHLORIDE 0.9% FLUSH
3.0000 mL | Freq: Two times a day (BID) | INTRAVENOUS | Status: DC
  Administered 2015-12-12 (×3): 3 mL via INTRAVENOUS

## 2015-12-12 MED ORDER — DOCUSATE SODIUM 100 MG PO CAPS: 200.0000 mg | ORAL_CAPSULE | Freq: Every day | ORAL | Status: DC | PRN

## 2015-12-12 MED ORDER — AMLODIPINE BESYLATE 5 MG PO TABS
5.0000 mg | ORAL_TABLET | Freq: Every day | ORAL | Status: DC
  Administered 2015-12-12 – 2015-12-13 (×2): 5 mg via ORAL
  Filled 2015-12-12 (×2): qty 1

## 2015-12-12 MED ORDER — ONDANSETRON HCL 4 MG PO TABS: 4.0000 mg | ORAL_TABLET | Freq: Four times a day (QID) | ORAL | Status: DC | PRN

## 2015-12-12 MED ORDER — APIXABAN 2.5 MG PO TABS
2.5000 mg | ORAL_TABLET | Freq: Two times a day (BID) | ORAL | Status: DC
  Administered 2015-12-12 – 2015-12-13 (×4): 2.5 mg via ORAL
  Filled 2015-12-12 (×5): qty 1

## 2015-12-12 MED ORDER — ASPIRIN EC 81 MG PO TBEC
81.0000 mg | DELAYED_RELEASE_TABLET | Freq: Every day | ORAL | Status: DC
  Administered 2015-12-12 – 2015-12-13 (×2): 81 mg via ORAL
  Filled 2015-12-12 (×2): qty 1

## 2015-12-12 MED ORDER — PANTOPRAZOLE SODIUM 40 MG IV SOLR
40.0000 mg | Freq: Two times a day (BID) | INTRAVENOUS | Status: DC
  Administered 2015-12-12 (×3): 40 mg via INTRAVENOUS
  Filled 2015-12-12 (×5): qty 40

## 2015-12-12 MED ORDER — ISOSORBIDE MONONITRATE ER 30 MG PO TB24
30.0000 mg | ORAL_TABLET | Freq: Every day | ORAL | Status: DC
  Administered 2015-12-12 – 2015-12-13 (×3): 30 mg via ORAL
  Filled 2015-12-12 (×3): qty 1

## 2015-12-12 MED ORDER — ZOLPIDEM TARTRATE 5 MG PO TABS
5.0000 mg | ORAL_TABLET | Freq: Every evening | ORAL | Status: DC | PRN
  Administered 2015-12-12 – 2015-12-13 (×2): 5 mg via ORAL
  Filled 2015-12-12 (×2): qty 1

## 2015-12-12 MED ORDER — METHYLPREDNISOLONE SODIUM SUCC 125 MG IJ SOLR
125.0000 mg | Freq: Once | INTRAMUSCULAR | Status: AC
  Administered 2015-12-12: 125 mg via INTRAVENOUS
  Filled 2015-12-12: qty 2

## 2015-12-12 MED ORDER — LOSARTAN POTASSIUM 50 MG PO TABS
100.0000 mg | ORAL_TABLET | Freq: Every day | ORAL | Status: DC
  Administered 2015-12-12 (×2): 100 mg via ORAL
  Filled 2015-12-12 (×3): qty 2

## 2015-12-12 MED ORDER — ZOLPIDEM TARTRATE 10 MG PO TABS: 10.0000 mg | ORAL_TABLET | Freq: Every evening | ORAL | Status: DC | PRN

## 2015-12-12 MED ORDER — DEXTROSE 5 % IV SOLN
1.0000 g | INTRAVENOUS | Status: DC
  Filled 2015-12-12: qty 10

## 2015-12-12 MED ORDER — ACETAMINOPHEN 325 MG PO TABS: 650.0000 mg | ORAL_TABLET | Freq: Four times a day (QID) | ORAL | Status: DC | PRN

## 2015-12-12 MED ORDER — PAROXETINE HCL 10 MG PO TABS
10.0000 mg | ORAL_TABLET | Freq: Every day | ORAL | Status: DC
  Administered 2015-12-12 – 2015-12-13 (×2): 10 mg via ORAL
  Filled 2015-12-12 (×2): qty 1

## 2015-12-12 MED ORDER — IPRATROPIUM-ALBUTEROL 0.5-2.5 (3) MG/3ML IN SOLN: 3.0000 mL | RESPIRATORY_TRACT | Status: DC

## 2015-12-12 MED ORDER — GABAPENTIN 300 MG PO CAPS
600.0000 mg | ORAL_CAPSULE | Freq: Every evening | ORAL | Status: DC
  Administered 2015-12-12 (×2): 600 mg via ORAL
  Filled 2015-12-12 (×3): qty 2

## 2015-12-12 MED ORDER — ACETAMINOPHEN 650 MG RE SUPP: 650.0000 mg | Freq: Four times a day (QID) | RECTAL | Status: DC | PRN

## 2015-12-12 NOTE — Progress Notes (Signed)
  Echocardiogram 2D Echocardiogram has been performed.  Tye SavoyCasey N Francene Mcerlean 12/12/2015, 4:02 PM

## 2015-12-12 NOTE — Consult Note (Signed)
PULMONARY / CRITICAL CARE MEDICINE   Name: Stacy Contreras MRN: [id number] DOB: 11/13/1940    ADMISSION DATE:  12/11/2015 CONSULTATION DATE:  12/12/15  REFERRING MD:  [facility] - Dr Lovell Sheehan and Jomarie Longs  CHIEF COMPLAINT:   - dyspnea, cough, exertional desats  HISTORY OF PRESENT ILLNESS:   - 75 year old female from [place]. 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 Reports in the 1970s she had diagnosis  of systemic lupus erythematosus affecting the kidneys and  the joints. She was seen at [facility] and was on an experiment drug not otherwise specified for 6 months. After that she never had any problems lupus. Then in 2014 she had shortness of breath and had coronary stenting to the LAD but still had class III-for dyspnea and had repeat coronary stenting after which she significantly improved class II dyspnea on exertion which has been stable. She was told at that time she did not have any pneumonia but she does not recollect any imaging at that time. Then in November 2016 started having some abdominal pain and worsening shortness of breath associated with new onset of cough since February 2017. Symptoms been progressive. Class III levels. Cough is dry. Present day and night. She presents to the emergency department at [facility] and was found to be hypoxemic at rest and started on nasal cannula oxygen. At no point she's had a fever. She did get treated for pneumonia in February 2017 but she says she never had any pneumonia symptoms of flu symptoms and therefore the diagnosis of pneumonia was presumed based on his symptoms. She Recollects acid reflux evaluation with endoscopy and placement of ppi treatment.  She denies any exposure to hot tub or Jacuzzi or mold or mildew or feathered pillows. She does not have any birds in the house. She does not work with molasses or in a brewery. She denies any ongoing collagen-vascular disease.  CT scan of the chest showed groundglass  opacities that were scattered.   PAST MEDICAL HISTORY :  She  has a past medical history of Diabetes mellitus without complication (HCC); Coronary artery disease; Hypertension; and DVT (deep venous thrombosis) (HCC).  PAST SURGICAL HISTORY: She  has no past surgical history on file.  Allergies  Allergen Reactions  . Codeine Other (See Comments)    Fast heart beat  . Statins     Muscle weakness  . Sulfa Antibiotics Rash    No current facility-administered medications on file prior to encounter.   No current outpatient prescriptions on file prior to encounter.    FAMILY HISTORY:  Her has no family status information on file.   SOCIAL HISTORY: She  reports that she has never smoked. She does not have any smokeless tobacco history on file. She reports that she does not drink alcohol.  REVIEW OF SYSTEMS:   Detailed 11 point review of systems not possible because of focus history but is as detailed in the history of present illness otherwise negative.  VITAL SIGNS: BP 107/46 mmHg  Pulse 66  Temp(Src) 97.7 F (36.5 C) (Oral)  Resp 18  Ht 5\' 5"  (1.651 m)  Wt 65.46 kg (144 lb 5 oz)  BMI 24.01 kg/m2  SpO2 95%  HEMODYNAMICS:    VENTILATOR SETTINGS:    INTAKE / OUTPUT: I/O last 3 completed shifts: In: -  Out: 250 [Urine:250]  PHYSICAL EXAMINATION: General:  Looks well. Seated on the side of the bed. Neuro:  Alert and oriented 3. Speech normal  sitting normally HEENT:  Nasal cannula oxygen on. No elevated JVP no neck nodes Cardiovascular:  Regular rate and rhythm no murmurs Lungs:  Scattered crackles present no increased work of breathing Abdomen:  Soft nontender Musculoskeletal:  No cyanosis no clubbing no edema. No Raynaud's Skin:  Intact  LABS: PULMONARY No results for input(s): PHART, PCO2ART, PO2ART, HCO3, TCO2, O2SAT in the last 168 hours.  Invalid input(s): PCO2, PO2  CBC  Recent Labs Lab 12/11/15 1805 12/12/15 0432  HGB 11.9* 11.9*  HCT 34.2*  34.5*  WBC 10.7* 7.0  PLT 258 265    COAGULATION No results for input(s): INR in the last 168 hours.  CARDIAC  No results for input(s): TROPONINI in the last 168 hours. No results for input(s): PROBNP in the last 168 hours.   CHEMISTRY  Recent Labs Lab 12/11/15 1806 12/12/15 0432  NA 137 142  K 3.4* 4.3  CL 103 107  CO2 24 25  GLUCOSE 124* 207*  BUN 13 9  CREATININE 0.74 0.68  CALCIUM 9.0 9.0   Estimated Creatinine Clearance: 56.4 mL/min (by C-G formula based on Cr of 0.68).   LIVER  Recent Labs Lab 12/11/15 1806  AST 14*  ALT 13*  ALKPHOS 52  BILITOT 0.6  PROT 6.7  ALBUMIN 3.4*     INFECTIOUS  Recent Labs Lab 12/11/15 1817 12/11/15 2057  LATICACIDVEN 1.07 0.91     ENDOCRINE CBG (last 3)   Recent Labs  12/11/15 2320 12/12/15 0715  GLUCAP 121* 196*         IMAGING x48h  - image(s) personally visualized  -   highlighted in bold Dg Chest 2 View  12/11/2015  CLINICAL DATA:  Pneumonia. Productive cough and shortness of breath. History of asthma. EXAM: CHEST  2 VIEW COMPARISON:  None. FINDINGS: Normal cardiac silhouette and mediastinal contours with atherosclerotic plaque within the thoracic aorta. Calcified lymph nodes are noted about the bilateral hila, left greater than right with scattered bilateral punctate granuloma. There is mild diffuse slightly nodular thickening of the pulmonary interstitium. No focal airspace opacities. There is minimal pleural parenchymal thickening about the right minor and bilateral major fissures. No pleural effusion or pneumothorax. No evidence of edema. No acute osseus abnormalities. Post cholecystectomy. IMPRESSION: 1. Findings suggestive of airways disease / bronchitis. No focal airspace opacities to suggest pneumonia. 2. Sequela of prior granulomatous infection. Electronically Signed   By: Simonne Come M.D.   On: 12/11/2015 17:47   Ct Chest W Contrast  12/11/2015  CLINICAL DATA:  75 year old with approximate 3  month history of cough and recent onset of left-sided abdominal pain and hematuria. EXAM: CT CHEST, ABDOMEN, AND PELVIS WITH CONTRAST TECHNIQUE: Multidetector CT imaging of the chest, abdomen and pelvis was performed following the standard protocol during bolus administration of intravenous contrast. CONTRAST:  ISOVUE-300 IOPAMIDOL 61% IV. Oral contrast was also administered. COMPARISON:  None. FINDINGS: CT CHEST FINDINGS Cardiovascular: Heart mildly enlarged. Severe LAD coronary atherosclerosis. No pericardial effusion. Severe atherosclerosis involving the thoracic aorta without evidence of aneurysm. Atherosclerosis at the origin of the left subclavian artery without significant stenosis. Mediastinum/Lymph Nodes: Numerous calcified lymph nodes throughout the mediastinum. Noncalcified prevascular lymph nodes of normal size. No pathologic lymphadenopathy. Normal esophagus. No mediastinal masses. Visualized thyroid gland unremarkable. Lungs/Pleura: Ground-glass airspace opacities throughout both lungs, associated with interstitial opacities throughout both lungs which sure most prominent in the lower lobes. No solid nodules or masses. No pleural effusions. Musculoskeletal: Degenerative disc disease and spondylosis at T9-10. No  acute abnormalities. CT ABDOMEN PELVIS FINDINGS Hepatobiliary: Mild diffuse hepatic steatosis without focal hepatic parenchymal abnormality. Gallbladder surgically absent. No biliary ductal dilation. Pancreas: Normal in appearance without evidence of mass, ductal dilation, or inflammation. Spleen: Normal in size and appearance. Adrenals/Urinary Tract: Approximate 1.1 x 1.7 x 1.3 cm low-attenuation nodule arising from the left adrenal gland. Normal right adrenal gland. Bilateral extrarenal pelves. Subcentimeter simple cyst arising from the lower pole of the left kidney. No significant focal parenchymal abnormality involving either kidney. No urinary tract calculi or obstruction.  Normal-appearing urinary bladder. Stomach/Bowel: Stomach normal in appearance for the degree of distention. Normal-appearing small bowel. Mobile cecum positioned in the right upper quadrant. Entire colon decompressed containing liquid stool. Scattered sigmoid colon diverticula without evidence of acute diverticulitis. Appendix not visualized but no pericecal inflammation. Vascular/Lymphatic: Severe aortoiliofemoral atherosclerosis without aneurysm. Visceral arteries atherosclerotic though patent. No pathologic lymphadenopathy. Reproductive: Surgically absent uterus.  No adnexal masses. Other: Numerous pelvic phleboliths. Very small umbilical hernia containing fat. Musculoskeletal: Severe degenerative disc disease L5-S1 with chronic disc protrusion as there is calcification in the posterior annular fibers. IMPRESSION: 1. Ground-glass opacities throughout both lungs associated with interstitial opacities, a nonspecific finding which can be seen in patients with pulmonary edema or with acute pulmonary inflammatory disorders. 2. No acute abnormalities involving the abdomen or pelvis. 3. 1.7 cm left adrenal nodule, statistically a small adenoma. 4. Sigmoid colon diverticulosis without evidence of acute diverticulitis. 5. Mild diffuse hepatic steatosis. Electronically Signed   By: Hulan Saas M.D.   On: 12/11/2015 19:55   Ct Abdomen Pelvis W Contrast  12/11/2015  CLINICAL DATA:  75 year old with approximate 3 month history of cough and recent onset of left-sided abdominal pain and hematuria. EXAM: CT CHEST, ABDOMEN, AND PELVIS WITH CONTRAST TECHNIQUE: Multidetector CT imaging of the chest, abdomen and pelvis was performed following the standard protocol during bolus administration of intravenous contrast. CONTRAST:  ISOVUE-300 IOPAMIDOL 61% IV. Oral contrast was also administered. COMPARISON:  None. FINDINGS: CT CHEST FINDINGS Cardiovascular: Heart mildly enlarged. Severe LAD coronary atherosclerosis. No  pericardial effusion. Severe atherosclerosis involving the thoracic aorta without evidence of aneurysm. Atherosclerosis at the origin of the left subclavian artery without significant stenosis. Mediastinum/Lymph Nodes: Numerous calcified lymph nodes throughout the mediastinum. Noncalcified prevascular lymph nodes of normal size. No pathologic lymphadenopathy. Normal esophagus. No mediastinal masses. Visualized thyroid gland unremarkable. Lungs/Pleura: Ground-glass airspace opacities throughout both lungs, associated with interstitial opacities throughout both lungs which sure most prominent in the lower lobes. No solid nodules or masses. No pleural effusions. Musculoskeletal: Degenerative disc disease and spondylosis at T9-10. No acute abnormalities. CT ABDOMEN PELVIS FINDINGS Hepatobiliary: Mild diffuse hepatic steatosis without focal hepatic parenchymal abnormality. Gallbladder surgically absent. No biliary ductal dilation. Pancreas: Normal in appearance without evidence of mass, ductal dilation, or inflammation. Spleen: Normal in size and appearance. Adrenals/Urinary Tract: Approximate 1.1 x 1.7 x 1.3 cm low-attenuation nodule arising from the left adrenal gland. Normal right adrenal gland. Bilateral extrarenal pelves. Subcentimeter simple cyst arising from the lower pole of the left kidney. No significant focal parenchymal abnormality involving either kidney. No urinary tract calculi or obstruction. Normal-appearing urinary bladder. Stomach/Bowel: Stomach normal in appearance for the degree of distention. Normal-appearing small bowel. Mobile cecum positioned in the right upper quadrant. Entire colon decompressed containing liquid stool. Scattered sigmoid colon diverticula without evidence of acute diverticulitis. Appendix not visualized but no pericecal inflammation. Vascular/Lymphatic: Severe aortoiliofemoral atherosclerosis without aneurysm. Visceral arteries atherosclerotic though patent. No pathologic  lymphadenopathy. Reproductive: Surgically absent  uterus.  No adnexal masses. Other: Numerous pelvic phleboliths. Very small umbilical hernia containing fat. Musculoskeletal: Severe degenerative disc disease L5-S1 with chronic disc protrusion as there is calcification in the posterior annular fibers. IMPRESSION: 1. Ground-glass opacities throughout both lungs associated with interstitial opacities, a nonspecific finding which can be seen in patients with pulmonary edema or with acute pulmonary inflammatory disorders. 2. No acute abnormalities involving the abdomen or pelvis. 3. 1.7 cm left adrenal nodule, statistically a small adenoma. 4. Sigmoid colon diverticulosis without evidence of acute diverticulitis. 5. Mild diffuse hepatic steatosis. Electronically Signed   By: Hulan Saashomas  Lawrence M.D.   On: 12/11/2015 19:55       ASSESSMENT / PLAN:  PULMONARY A: Acute on chronic respiratory failure mild with nasal cannula oxygen requirement. In the setting of chronic cough and chronic shortness of breath and new diagnosis DIFFUSE ground glass opacities on high-resolution CT chest. Features suggestive of interstitial lung disease currently idiopathic. CT pattern is not consistent with UIP or IPF  - Differential diagnosis at this point is NSIP , autoimmune, , pulmonary alveolar proteinosis, tonic eosinophilic pneumonia P:   Check autoimmune and vasculitis profile Check angiotensin-converting enzyme for sarcoid Hold off on any steroids for the moment Check erythrocyte sedimentation rate Check echocardiogram Check pulmonary function test Check pro calcitonin, lactic acid and troponin   Based on the above consider bronchoscopy with lavage versus direct surgical lung biopsy especially if workup is negative  She and husband agree with the plan    Dr. Kalman ShanMurali Temara Lanum, M.D., [facility] City[facility]P Pulmonary and Critical Care Medicine Staff Physician [facility] Pager: [phone]  [phone], If no answer or between  15:00h - 7:00h: call [phone]  12/12/2015 9:35 AM

## 2015-12-12 NOTE — Progress Notes (Signed)
PROGRESS NOTE    Stacy Contreras  KWI:[id number] DOB: Apr 14, 1941 DOA: 12/11/2015 PCP: No primary care provider on file.  Outpatient Specialists: Brief Narrative:75 year old female from [place], was told in the 1970s that she had SLE affecting the kidneys and the joints. She was seen at [facility] and was on an experiment drug not otherwise specified for 6 months. After that she never had any problems lupus. Then in 2014 she had shortness of breath and had coronary stenting to the LAD but still had class III-for dyspnea and had repeat coronary stenting after which she significantly improved class II dyspnea on exertion which has been stable.Then in November 2016 started having some abdominal pain and worsening shortness of breath associated with new onset of cough since February 2017. Symptoms been progressive. Class III levels. Cough is dry. Present day and night. She presents to the emergency department at [facility] and was found to be hypoxemic at rest and started on nasal cannula oxygen. Ct chest with interstitial opacities diffusely  Assessment & Plan:     Acute on Chronic Hypoxic resp failure -CT concerning for interstitial lung disease -Pulm consult appreciated -check BNP and ECHO -will FU ESR, ANA, ACE level, Sjogrens Ab -add antiDS Dna -may need Biopsy if above workup not diagnostic -stop Abx, doesn't need CAP coverage    Coronary artery disease -stable, continue ASA/Atenolol    Diabetes mellitus without complication -Hold Oral Hypoglycemic Rx, SSI -FU HbA1C in AM    Hypertension -continue Amlodipine, Atenolol, and Losartan Rx   H/o  DVT (deep venous thrombosis)  -on apixaban   DVT prophylaxis:Lovenox  Code Status:Full Code Family Communication:None at bedside Disposition Plan:Home pending above workup   Consultants:   Pulm  Subjective: Reports dyspnea on exertion  Objective: Filed Vitals:   12/12/15 0141 12/12/15 0514 12/12/15 0828  12/12/15 0926  BP:  107/46  121/65  Pulse:  66  67  Temp:  97.7 F (36.5 C)  98.2 F (36.8 C)  TempSrc:  Oral  Oral  Resp: _0 Height:      Weight:      SpO2: 98% 97% 95% 96%    Intake/Output Summary (Last 24 hours) at 12/12/15 1356 Last data filed at 12/12/15 1100  Gross per 24 hour  Intake    240 ml  Output    650 ml  Net   -410 ml   Filed Weights   12/11/15 1805  Weight: 65.46 kg (144 lb 5 oz)    Examination:  General exam: Appears calm and comfortable  Respiratory system: scattered basilar crackles Cardiovascular system: S1 & S2 heard, RRR. No JVD, murmurs, rubs, gallops or clicks. No pedal edema. Gastrointestinal system: Abdomen is nondistended, soft and nontender. No organomegaly or masses felt. Normal bowel sounds  Central nervous system: Alert and oriented. No focal neurological deficits. Extremities: Symmetric 5 x 5 power. Skin: No rashes, lesions or ulcers Psychiatry: Judgement and insight appear normal. Mood & affect appropriate.     Data Reviewed: I have personally reviewed following labs and imaging studies  CBC:  Recent Labs Lab 12/11/15 1805 12/12/15 0432  WBC 10.7* 7.0  NEUTROABS 8.8*  --   HGB 11.9* 11.9*  HCT 34.2* 34.5*  MCV 90.2 90.1  PLT 258 242   Basic Metabolic Panel:  Recent Labs Lab 12/11/15 1806 12/12/15 0432  NA 137 142  K 3.4* 4.3  CL 103 107  CO2 24 25  GLUCOSE 124* 207*  BUN 13  9  CREATININE 0.74 0.68  CALCIUM 9.0 9.0   GFR: Estimated Creatinine Clearance: 56.4 mL/min (by C-G formula based on Cr of 0.68). Liver Function Tests:  Recent Labs Lab 12/11/15 1806  AST 14*  ALT 13*  ALKPHOS 52  BILITOT 0.6  PROT 6.7  ALBUMIN 3.4*    Recent Labs Lab 12/11/15 1805  LIPASE 30   No results for input(s): AMMONIA in the last 168 hours. Coagulation Profile: No results for input(s): INR, PROTIME in the last 168 hours. Cardiac Enzymes:  Recent Labs Lab 12/12/15 0912  TROPONINI <0.03   BNP (last 3  results) No results for input(s): PROBNP in the last 8760 hours. HbA1C: No results for input(s): HGBA1C in the last 72 hours. CBG:  Recent Labs Lab 12/11/15 2320 12/12/15 0715  GLUCAP 121* 196*   Lipid Profile: No results for input(s): CHOL, HDL, LDLCALC, TRIG, CHOLHDL, LDLDIRECT in the last 72 hours. Thyroid Function Tests: No results for input(s): TSH, T4TOTAL, FREET4, T3FREE, THYROIDAB in the last 72 hours. Anemia Panel: No results for input(s): VITAMINB12, FOLATE, FERRITIN, TIBC, IRON, RETICCTPCT in the last 72 hours. Urine analysis:    Component Value Date/Time   COLORURINE YELLOW 12/11/2015 2003   APPEARANCEUR CLOUDY* 12/11/2015 2003   LABSPEC 1.016 12/11/2015 2003   PHURINE 5.5 12/11/2015 2003   GLUCOSEU NEGATIVE 12/11/2015 2003   HGBUR NEGATIVE 12/11/2015 2003   BILIRUBINUR NEGATIVE 12/11/2015 2003   [place] 12/11/2015 2003   PROTEINUR NEGATIVE 12/11/2015 2003   NITRITE NEGATIVE 12/11/2015 2003   LEUKOCYTESUR SMALL* 12/11/2015 2003   Sepsis Labs: _0 (procalcitonin:4,lacticidven:4)  )No results found for this or any previous visit (from the past 240 hour(s)).       Radiology Studies: Dg Chest 2 View  12/11/2015  CLINICAL DATA:  Pneumonia. Productive cough and shortness of breath. History of asthma. EXAM: CHEST  2 VIEW COMPARISON:  None. FINDINGS: Normal cardiac silhouette and mediastinal contours with atherosclerotic plaque within the thoracic aorta. Calcified lymph nodes are noted about the bilateral hila, left greater than right with scattered bilateral punctate granuloma. There is mild diffuse slightly nodular thickening of the pulmonary interstitium. No focal airspace opacities. There is minimal pleural parenchymal thickening about the right minor and bilateral major fissures. No pleural effusion or pneumothorax. No evidence of edema. No acute osseus abnormalities. Post cholecystectomy. IMPRESSION: 1. Findings suggestive of airways disease /  bronchitis. No focal airspace opacities to suggest pneumonia. 2. Sequela of prior granulomatous infection. Electronically Signed   By: Sandi Mariscal M.D.   On: 12/11/2015 17:47   Ct Chest W Contrast  12/11/2015  CLINICAL DATA:  75 year old with approximate 3 month history of cough and recent onset of left-sided abdominal pain and hematuria. EXAM: CT CHEST, ABDOMEN, AND PELVIS WITH CONTRAST TECHNIQUE: Multidetector CT imaging of the chest, abdomen and pelvis was performed following the standard protocol during bolus administration of intravenous contrast. CONTRAST:  172m ISOVUE-300 IOPAMIDOL 61% IV. Oral contrast was also administered. COMPARISON:  None. FINDINGS: CT CHEST FINDINGS Cardiovascular: Heart mildly enlarged. Severe LAD coronary atherosclerosis. No pericardial effusion. Severe atherosclerosis involving the thoracic aorta without evidence of aneurysm. Atherosclerosis at the origin of the left subclavian artery without significant stenosis. Mediastinum/Lymph Nodes: Numerous calcified lymph nodes throughout the mediastinum. Noncalcified prevascular lymph nodes of normal size. No pathologic lymphadenopathy. Normal esophagus. No mediastinal masses. Visualized thyroid gland unremarkable. Lungs/Pleura: Ground-glass airspace opacities throughout both lungs, associated with interstitial opacities throughout both lungs which sure most prominent in the lower lobes. No solid nodules  or masses. No pleural effusions. Musculoskeletal: Degenerative disc disease and spondylosis at T9-10. No acute abnormalities. CT ABDOMEN PELVIS FINDINGS Hepatobiliary: Mild diffuse hepatic steatosis without focal hepatic parenchymal abnormality. Gallbladder surgically absent. No biliary ductal dilation. Pancreas: Normal in appearance without evidence of mass, ductal dilation, or inflammation. Spleen: Normal in size and appearance. Adrenals/Urinary Tract: Approximate 1.1 x 1.7 x 1.3 cm low-attenuation nodule arising from the left adrenal  gland. Normal right adrenal gland. Bilateral extrarenal pelves. Subcentimeter simple cyst arising from the lower pole of the left kidney. No significant focal parenchymal abnormality involving either kidney. No urinary tract calculi or obstruction. Normal-appearing urinary bladder. Stomach/Bowel: Stomach normal in appearance for the degree of distention. Normal-appearing small bowel. Mobile cecum positioned in the right upper quadrant. Entire colon decompressed containing liquid stool. Scattered sigmoid colon diverticula without evidence of acute diverticulitis. Appendix not visualized but no pericecal inflammation. Vascular/Lymphatic: Severe aortoiliofemoral atherosclerosis without aneurysm. Visceral arteries atherosclerotic though patent. No pathologic lymphadenopathy. Reproductive: Surgically absent uterus.  No adnexal masses. Other: Numerous pelvic phleboliths. Very small umbilical hernia containing fat. Musculoskeletal: Severe degenerative disc disease L5-S1 with chronic disc protrusion as there is calcification in the posterior annular fibers. IMPRESSION: 1. Ground-glass opacities throughout both lungs associated with interstitial opacities, a nonspecific finding which can be seen in patients with pulmonary edema or with acute pulmonary inflammatory disorders. 2. No acute abnormalities involving the abdomen or pelvis. 3. 1.7 cm left adrenal nodule, statistically a small adenoma. 4. Sigmoid colon diverticulosis without evidence of acute diverticulitis. 5. Mild diffuse hepatic steatosis. Electronically Signed   By: Evangeline Dakin M.D.   On: 12/11/2015 19:55   Ct Abdomen Pelvis W Contrast  12/11/2015  CLINICAL DATA:  75 year old with approximate 3 month history of cough and recent onset of left-sided abdominal pain and hematuria. EXAM: CT CHEST, ABDOMEN, AND PELVIS WITH CONTRAST TECHNIQUE: Multidetector CT imaging of the chest, abdomen and pelvis was performed following the standard protocol during bolus  administration of intravenous contrast. CONTRAST:  174m ISOVUE-300 IOPAMIDOL 61% IV. Oral contrast was also administered. COMPARISON:  None. FINDINGS: CT CHEST FINDINGS Cardiovascular: Heart mildly enlarged. Severe LAD coronary atherosclerosis. No pericardial effusion. Severe atherosclerosis involving the thoracic aorta without evidence of aneurysm. Atherosclerosis at the origin of the left subclavian artery without significant stenosis. Mediastinum/Lymph Nodes: Numerous calcified lymph nodes throughout the mediastinum. Noncalcified prevascular lymph nodes of normal size. No pathologic lymphadenopathy. Normal esophagus. No mediastinal masses. Visualized thyroid gland unremarkable. Lungs/Pleura: Ground-glass airspace opacities throughout both lungs, associated with interstitial opacities throughout both lungs which sure most prominent in the lower lobes. No solid nodules or masses. No pleural effusions. Musculoskeletal: Degenerative disc disease and spondylosis at T9-10. No acute abnormalities. CT ABDOMEN PELVIS FINDINGS Hepatobiliary: Mild diffuse hepatic steatosis without focal hepatic parenchymal abnormality. Gallbladder surgically absent. No biliary ductal dilation. Pancreas: Normal in appearance without evidence of mass, ductal dilation, or inflammation. Spleen: Normal in size and appearance. Adrenals/Urinary Tract: Approximate 1.1 x 1.7 x 1.3 cm low-attenuation nodule arising from the left adrenal gland. Normal right adrenal gland. Bilateral extrarenal pelves. Subcentimeter simple cyst arising from the lower pole of the left kidney. No significant focal parenchymal abnormality involving either kidney. No urinary tract calculi or obstruction. Normal-appearing urinary bladder. Stomach/Bowel: Stomach normal in appearance for the degree of distention. Normal-appearing small bowel. Mobile cecum positioned in the right upper quadrant. Entire colon decompressed containing liquid stool. Scattered sigmoid colon  diverticula without evidence of acute diverticulitis. Appendix not visualized but no pericecal inflammation. Vascular/Lymphatic: Severe aortoiliofemoral  atherosclerosis without aneurysm. Visceral arteries atherosclerotic though patent. No pathologic lymphadenopathy. Reproductive: Surgically absent uterus.  No adnexal masses. Other: Numerous pelvic phleboliths. Very small umbilical hernia containing fat. Musculoskeletal: Severe degenerative disc disease L5-S1 with chronic disc protrusion as there is calcification in the posterior annular fibers. IMPRESSION: 1. Ground-glass opacities throughout both lungs associated with interstitial opacities, a nonspecific finding which can be seen in patients with pulmonary edema or with acute pulmonary inflammatory disorders. 2. No acute abnormalities involving the abdomen or pelvis. 3. 1.7 cm left adrenal nodule, statistically a small adenoma. 4. Sigmoid colon diverticulosis without evidence of acute diverticulitis. 5. Mild diffuse hepatic steatosis. Electronically Signed   By: Evangeline Dakin M.D.   On: 12/11/2015 19:55        Scheduled Meds: . amLODipine  5 mg Oral Daily  . apixaban  2.5 mg Oral BID  . aspirin EC  81 mg Oral Daily  . atenolol  25 mg Oral BID  . azithromycin  500 mg Intravenous Q24H  . cefTRIAXone (ROCEPHIN)  IV  1 g Intravenous Q24H  . gabapentin  600 mg Oral QPM  . insulin aspart  0-5 Units Subcutaneous QHS  . insulin aspart  0-9 Units Subcutaneous TID WC  . ipratropium-albuterol  3 mL Nebulization BID  . isosorbide mononitrate  30 mg Oral Daily  . losartan  100 mg Oral QHS  . pantoprazole (PROTONIX) IV  40 mg Intravenous Q12H  . PARoxetine  10 mg Oral Daily  . sodium chloride flush  3 mL Intravenous Q12H   Continuous Infusions:       Time spent: 85mn    PDomenic Polite MD [facility] Pager [phone] If 7PM-7AM, please contact night-coverage [email or web address] Password [email or web address] 12/12/2015, 1:56 PM

## 2015-12-13 ENCOUNTER — Inpatient Hospital Stay (HOSPITAL_COMMUNITY): Payer: Medicare Other

## 2015-12-13 ENCOUNTER — Other Ambulatory Visit (HOSPITAL_COMMUNITY): Payer: Self-pay | Admitting: Respiratory Therapy

## 2015-12-13 DIAGNOSIS — R0602 Shortness of breath: Secondary | ICD-10-CM

## 2015-12-13 LAB — PULMONARY FUNCTION TEST
DL/VA % pred: 77 %
DL/VA: 3.83 ml/min/mmHg/L
DLCO cor % pred: 47 %
DLCO cor: 12.17 ml/min/mmHg
DLCO unc % pred: 45 %
DLCO unc: 11.57 ml/min/mmHg
FEF 25-75 Post: 0.78 L/sec
FEF 25-75 Pre: 1.33 L/sec
FEF2575-%Change-Post: -41 %
FEF2575-%Pred-Post: 43 %
FEF2575-%Pred-Pre: 74 %
FEV1-%Change-Post: -7 %
FEV1-%Pred-Post: 59 %
FEV1-%Pred-Pre: 64 %
FEV1-Post: 1.35 L
FEV1-Pre: 1.47 L
FEV1FVC-%Change-Post: -1 %
FEV1FVC-%Pred-Pre: 104 %
FEV6-%Change-Post: -9 %
FEV6-%Pred-Post: 58 %
FEV6-%Pred-Pre: 64 %
FEV6-Post: 1.68 L
FEV6-Pre: 1.84 L
FEV6FVC-%Change-Post: 0 %
FEV6FVC-%Pred-Post: 104 %
FEV6FVC-%Pred-Pre: 105 %
FVC-%Change-Post: -6 %
FVC-%Pred-Post: 58 %
FVC-%Pred-Pre: 62 %
FVC-Post: 1.74 L
FVC-Pre: 1.86 L
Post FEV1/FVC ratio: 77 %
Post FEV6/FVC ratio: 100 %
Pre FEV1/FVC ratio: 79 %
Pre FEV6/FVC Ratio: 100 %
RV % pred: 94 %
RV: 2.18 L
TLC % pred: 77 %
TLC: 4.02 L

## 2015-12-13 LAB — SEDIMENTATION RATE: Sed Rate: 32 mm/hr — ABNORMAL HIGH (ref 0–22)

## 2015-12-13 LAB — GLUCOSE, CAPILLARY
Glucose-Capillary: 114 mg/dL — ABNORMAL HIGH (ref 65–99)
Glucose-Capillary: 140 mg/dL — ABNORMAL HIGH (ref 65–99)

## 2015-12-13 LAB — HIV ANTIBODY (ROUTINE TESTING W REFLEX): HIV Screen 4th Generation wRfx: NONREACTIVE

## 2015-12-13 LAB — ANTI-DNA ANTIBODY, DOUBLE-STRANDED: ds DNA Ab: <1 IU/mL (ref 0–9)

## 2015-12-13 LAB — HEMOGLOBIN A1C
Hgb A1c MFr Bld: 6.5 % — ABNORMAL HIGH (ref 4.8–5.6)
Mean Plasma Glucose: 140 mg/dL

## 2015-12-13 LAB — PROCALCITONIN: Procalcitonin: <0.1 ng/mL

## 2015-12-13 MED ORDER — APIXABAN 5 MG PO TABS: 2.5000 mg | ORAL_TABLET | Freq: Two times a day (BID) | ORAL | Status: DC

## 2015-12-13 MED ORDER — HYDROCODONE-HOMATROPINE 5-1.5 MG/5ML PO SYRP: 5.0000 mL | ORAL_SOLUTION | Freq: Four times a day (QID) | ORAL | Status: DC | PRN

## 2015-12-13 MED ORDER — ALBUTEROL SULFATE (2.5 MG/3ML) 0.083% IN NEBU
2.5000 mg | INHALATION_SOLUTION | Freq: Once | RESPIRATORY_TRACT | Status: AC
  Administered 2015-12-13: 2.5 mg via RESPIRATORY_TRACT

## 2015-12-13 MED ORDER — PANTOPRAZOLE SODIUM 40 MG PO TBEC
40.0000 mg | DELAYED_RELEASE_TABLET | Freq: Two times a day (BID) | ORAL | Status: DC
  Administered 2015-12-13: 40 mg via ORAL
  Filled 2015-12-13: qty 1

## 2015-12-13 NOTE — Consult Note (Addendum)
PULMONARY / CRITICAL CARE MEDICINE   Name: Stacy Contreras MRN: [id number] DOB: 18-Jul-1941    ADMISSION DATE:  12/11/2015 CONSULTATION DATE:  12/12/15  REFERRING MD:  [facility] - Dr Arnoldo Morale and Broadus John  CHIEF COMPLAINT:   - dyspnea, cough, exertional desats  Brief: - 75 year old female from [place]. Works in the [employer] as a Teacher, music. Reports in the 1970s she had diagnosis  of systemic lupus erythematosus affecting the kidneys and  the joints. She was seen at [facility] and was on an experiment drug not otherwise specified for 6 months. After that she never had any problems lupus. Then in 2014 she had shortness of breath and had coronary stenting to the LAD but still had class III-for dyspnea and had repeat coronary stenting after which she significantly improved class II dyspnea on exertion which has been stable. She was told at that time she did not have any pneumonia but she does not recollect any imaging at that time. Then in November 2016 started having some abdominal pain and worsening shortness of breath associated with new onset of cough since February 2017. Symptoms been progressive. Class III levels. Cough is dry. Present day and night. She presents to the emergency department at [facility] long and was found to be hypoxemic at rest and started on nasal cannula oxygen. At no point she's had a fever. She did get treated for pneumonia in February 2017 but she says she never had any pneumonia symptoms of flu symptoms and therefore the diagnosis of pneumonia was presumed based on his symptoms. She Recollects acid reflux evaluation with endoscopy and placement of ppi treatment.  She denies any exposure to hot tub or Jacuzzi or mold or mildew or feathered pillows. She does not have any birds in the house. She does not work with molasses or in a brewery. She denies any ongoing collagen-vascular disease.  CT scan of the chest showed groundglass opacities that were  scattered.   SUBJECTIVE/OVERNIGHT/INTERVAL HX  12/13/2015  - unchanged. Uneventful night. Feels o2 helps. No fever. ESR 32. Trop normal x 2. PCT normal. BNP norma.l.Echo normal. HIV negative. Autoimmune negative. sTill wtioh cough. PFTs with moderate restriction  VITAL SIGNS: BP 132/62 mmHg  Pulse 55  Temp(Src) 97.9 F (36.6 C) (Oral)  Resp 18  Ht 5' 5"  (1.651 m)  Wt 65.46 kg (144 lb 5 oz)  BMI 24.01 kg/m2  SpO2 100%    INTAKE / OUTPUT: I/O last 3 completed shifts: In: 600 [P.O.:600] Out: 2500 [Urine:2500]  PHYSICAL EXAMINATION: General:  Looks well. Seated on the side of the bed. Neuro:  Alert and oriented 3. Speech normal sitting normally HEENT:  Nasal cannula oxygen on. No elevated JVP no neck nodes Cardiovascular:  Regular rate and rhythm no murmurs Lungs:  Scattered crackles present no increased work of breathing Abdomen:  Soft nontender Musculoskeletal:  No cyanosis no clubbing no edema. No Raynaud's Skin:  Intact  LABS: PULMONARY No results for input(s): PHART, PCO2ART, PO2ART, HCO3, TCO2, O2SAT in the last 168 hours.  Invalid input(s): PCO2, PO2  CBC  Recent Labs Lab 12/11/15 1805 12/12/15 0432  HGB 11.9* 11.9*  HCT 34.2* 34.5*  WBC 10.7* 7.0  PLT 258 265    COAGULATION No results for input(s): INR in the last 168 hours.  CARDIAC    Recent Labs Lab 12/12/15 0912  TROPONINI <0.03   No results for input(s): PROBNP in the last 168 hours.   CHEMISTRY  Recent Labs Lab 12/11/15  1806 12/12/15 0432  NA 137 142  K 3.4* 4.3  CL 103 107  CO2 24 25  GLUCOSE 124* 207*  BUN 13 9  CREATININE 0.74 0.68  CALCIUM 9.0 9.0   Estimated Creatinine Clearance: 56.4 mL/min (by C-G formula based on Cr of 0.68).   LIVER  Recent Labs Lab 12/11/15 1806  AST 14*  ALT 13*  ALKPHOS 52  BILITOT 0.6  PROT 6.7  ALBUMIN 3.4*     INFECTIOUS  Recent Labs Lab 12/11/15 1817 12/11/15 2057 12/12/15 0912 12/12/15 1009 12/13/15 0422  LATICACIDVEN  1.07 0.91 1.2  --   --   PROCALCITON  --   --   --  <0.10 <0.10     ENDOCRINE CBG (last 3)   Recent Labs  12/12/15 1642 12/12/15 2222 12/13/15 0856  GLUCAP 165* 190* 114*         IMAGING x48h  - image(s) personally visualized  -   highlighted in bold Dg Chest 2 View  12/11/2015  CLINICAL DATA:  Pneumonia. Productive cough and shortness of breath. History of asthma. EXAM: CHEST  2 VIEW COMPARISON:  None. FINDINGS: Normal cardiac silhouette and mediastinal contours with atherosclerotic plaque within the thoracic aorta. Calcified lymph nodes are noted about the bilateral hila, left greater than right with scattered bilateral punctate granuloma. There is mild diffuse slightly nodular thickening of the pulmonary interstitium. No focal airspace opacities. There is minimal pleural parenchymal thickening about the right minor and bilateral major fissures. No pleural effusion or pneumothorax. No evidence of edema. No acute osseus abnormalities. Post cholecystectomy. IMPRESSION: 1. Findings suggestive of airways disease / bronchitis. No focal airspace opacities to suggest pneumonia. 2. Sequela of prior granulomatous infection. Electronically Signed   By: Sandi Mariscal M.D.   On: 12/11/2015 17:47   Ct Chest W Contrast  12/11/2015  CLINICAL DATA:  75 year old with approximate 3 month history of cough and recent onset of left-sided abdominal pain and hematuria. EXAM: CT CHEST, ABDOMEN, AND PELVIS WITH CONTRAST TECHNIQUE: Multidetector CT imaging of the chest, abdomen and pelvis was performed following the standard protocol during bolus administration of intravenous contrast. CONTRAST:  144m ISOVUE-300 IOPAMIDOL 61% IV. Oral contrast was also administered. COMPARISON:  None. FINDINGS: CT CHEST FINDINGS Cardiovascular: Heart mildly enlarged. Severe LAD coronary atherosclerosis. No pericardial effusion. Severe atherosclerosis involving the thoracic aorta without evidence of aneurysm. Atherosclerosis at the  origin of the left subclavian artery without significant stenosis. Mediastinum/Lymph Nodes: Numerous calcified lymph nodes throughout the mediastinum. Noncalcified prevascular lymph nodes of normal size. No pathologic lymphadenopathy. Normal esophagus. No mediastinal masses. Visualized thyroid gland unremarkable. Lungs/Pleura: Ground-glass airspace opacities throughout both lungs, associated with interstitial opacities throughout both lungs which sure most prominent in the lower lobes. No solid nodules or masses. No pleural effusions. Musculoskeletal: Degenerative disc disease and spondylosis at T9-10. No acute abnormalities. CT ABDOMEN PELVIS FINDINGS Hepatobiliary: Mild diffuse hepatic steatosis without focal hepatic parenchymal abnormality. Gallbladder surgically absent. No biliary ductal dilation. Pancreas: Normal in appearance without evidence of mass, ductal dilation, or inflammation. Spleen: Normal in size and appearance. Adrenals/Urinary Tract: Approximate 1.1 x 1.7 x 1.3 cm low-attenuation nodule arising from the left adrenal gland. Normal right adrenal gland. Bilateral extrarenal pelves. Subcentimeter simple cyst arising from the lower pole of the left kidney. No significant focal parenchymal abnormality involving either kidney. No urinary tract calculi or obstruction. Normal-appearing urinary bladder. Stomach/Bowel: Stomach normal in appearance for the degree of distention. Normal-appearing small bowel. Mobile cecum positioned in the right upper  quadrant. Entire colon decompressed containing liquid stool. Scattered sigmoid colon diverticula without evidence of acute diverticulitis. Appendix not visualized but no pericecal inflammation. Vascular/Lymphatic: Severe aortoiliofemoral atherosclerosis without aneurysm. Visceral arteries atherosclerotic though patent. No pathologic lymphadenopathy. Reproductive: Surgically absent uterus.  No adnexal masses. Other: Numerous pelvic phleboliths. Very small umbilical  hernia containing fat. Musculoskeletal: Severe degenerative disc disease L5-S1 with chronic disc protrusion as there is calcification in the posterior annular fibers. IMPRESSION: 1. Ground-glass opacities throughout both lungs associated with interstitial opacities, a nonspecific finding which can be seen in patients with pulmonary edema or with acute pulmonary inflammatory disorders. 2. No acute abnormalities involving the abdomen or pelvis. 3. 1.7 cm left adrenal nodule, statistically a small adenoma. 4. Sigmoid colon diverticulosis without evidence of acute diverticulitis. 5. Mild diffuse hepatic steatosis. Electronically Signed   By: Evangeline Dakin M.D.   On: 12/11/2015 19:55   Ct Abdomen Pelvis W Contrast  12/11/2015  CLINICAL DATA:  75 year old with approximate 3 month history of cough and recent onset of left-sided abdominal pain and hematuria. EXAM: CT CHEST, ABDOMEN, AND PELVIS WITH CONTRAST TECHNIQUE: Multidetector CT imaging of the chest, abdomen and pelvis was performed following the standard protocol during bolus administration of intravenous contrast. CONTRAST:  120m ISOVUE-300 IOPAMIDOL 61% IV. Oral contrast was also administered. COMPARISON:  None. FINDINGS: CT CHEST FINDINGS Cardiovascular: Heart mildly enlarged. Severe LAD coronary atherosclerosis. No pericardial effusion. Severe atherosclerosis involving the thoracic aorta without evidence of aneurysm. Atherosclerosis at the origin of the left subclavian artery without significant stenosis. Mediastinum/Lymph Nodes: Numerous calcified lymph nodes throughout the mediastinum. Noncalcified prevascular lymph nodes of normal size. No pathologic lymphadenopathy. Normal esophagus. No mediastinal masses. Visualized thyroid gland unremarkable. Lungs/Pleura: Ground-glass airspace opacities throughout both lungs, associated with interstitial opacities throughout both lungs which sure most prominent in the lower lobes. No solid nodules or masses. No  pleural effusions. Musculoskeletal: Degenerative disc disease and spondylosis at T9-10. No acute abnormalities. CT ABDOMEN PELVIS FINDINGS Hepatobiliary: Mild diffuse hepatic steatosis without focal hepatic parenchymal abnormality. Gallbladder surgically absent. No biliary ductal dilation. Pancreas: Normal in appearance without evidence of mass, ductal dilation, or inflammation. Spleen: Normal in size and appearance. Adrenals/Urinary Tract: Approximate 1.1 x 1.7 x 1.3 cm low-attenuation nodule arising from the left adrenal gland. Normal right adrenal gland. Bilateral extrarenal pelves. Subcentimeter simple cyst arising from the lower pole of the left kidney. No significant focal parenchymal abnormality involving either kidney. No urinary tract calculi or obstruction. Normal-appearing urinary bladder. Stomach/Bowel: Stomach normal in appearance for the degree of distention. Normal-appearing small bowel. Mobile cecum positioned in the right upper quadrant. Entire colon decompressed containing liquid stool. Scattered sigmoid colon diverticula without evidence of acute diverticulitis. Appendix not visualized but no pericecal inflammation. Vascular/Lymphatic: Severe aortoiliofemoral atherosclerosis without aneurysm. Visceral arteries atherosclerotic though patent. No pathologic lymphadenopathy. Reproductive: Surgically absent uterus.  No adnexal masses. Other: Numerous pelvic phleboliths. Very small umbilical hernia containing fat. Musculoskeletal: Severe degenerative disc disease L5-S1 with chronic disc protrusion as there is calcification in the posterior annular fibers. IMPRESSION: 1. Ground-glass opacities throughout both lungs associated with interstitial opacities, a nonspecific finding which can be seen in patients with pulmonary edema or with acute pulmonary inflammatory disorders. 2. No acute abnormalities involving the abdomen or pelvis. 3. 1.7 cm left adrenal nodule, statistically a small adenoma. 4. Sigmoid  colon diverticulosis without evidence of acute diverticulitis. 5. Mild diffuse hepatic steatosis. Electronically Signed   By: TEvangeline DakinM.D.   On: 12/11/2015 19:55  ASSESSMENT / PLAN:  PULMONARY A: Acute on chronic respiratory failure mild with nasal cannula oxygen requirement. In the setting of chronic cough and chronic shortness of breath and new diagnosis DIFFUSE ground glass opacities on high-resolution CT chest. Features suggestive of interstitial lung disease currently idiopathic. CT pattern is not consistent with UIP or IPF  - Differential diagnosis at this point is NSIP , autoimmune, , pulmonary alveolar proteinosis, chronic eosinophilic pneumonia and idiopathich IIP. PFTs with moderate obstrunction - FVC 60%, DLCO 47%  P:   AWait autoimmune and vasculitis profile, angiotensin-converting enzyme for sarcoid Hold steroid rx Will arrange for flexible vidoe  bronchoscopy with lavage  thu 5/117  - RT dept [phone]  - 11am scheduled at [facility] lab  - will need to hold elqiuis (low dose) for 24h - she has had prior hx of massive epistaxis from left nostril that needed OR packing  - will cancel bronch if autoimmune resolts suggests no need for bronch Dc on home o2 nasal cannula Dc abx - TRH to do Hycodan for cough - TRH to do She and husband agree with the plan   OPD fu depending on bronch - she will stay in touch with my office [phone] for any worsening or questions     > 50% of this >35 min visit spent in face to face counseling or coordination of care    Dr. Brand Males, M.D., [facility].C.P Pulmonary and Critical Care Medicine Staff Physician [facility] Pager: [phone], If no answer or between  15:00h - 7:00h: call [phone]  12/13/2015 10:37 AM

## 2015-12-13 NOTE — Progress Notes (Signed)
[facility]    [facility]: Rec'd order for home oxygen.  Per CM, patient's sats were 95% on RA which does not qualify for home oxygen.  If patient discharges after hours, please call [phone].   Renard HamperLecretia Williamson 12/13/2015, 11:53 AM

## 2015-12-13 NOTE — Progress Notes (Signed)
PHARMACIST - PHYSICIAN COMMUNICATION DR:   Jomarie LongsJoseph CONCERNING: IV to Oral Route Change Policy  RECOMMENDATION: This patient is receiving Protonix by the intravenous route.  Based on criteria approved by the Pharmacy and Therapeutics Committee, the intravenous medication(s) is/are being converted to the equivalent oral dose form(s).   DESCRIPTION: These criteria include:  The patient is eating (either orally or via tube) and/or has been taking other orally administered medications for a least 24 hours  The patient has no evidence of active gastrointestinal bleeding or impaired GI absorption (gastrectomy, short bowel, patient on TNA or NPO).   If you have questions about this conversion, please contact the Pharmacy Department  []   [phone]( [phone] )  [facility]   Hiro Vipond PharmD, [place] Pager [phone] 12/13/2015 8:25 AM

## 2015-12-13 NOTE — Discharge Summary (Signed)
Physician Discharge Summary  Stacy Contreras PIR:[id number] DOB: [birth year] DOA: 12/11/2015  PCP: No primary care provider on file.  Admit date: 12/11/2015 Discharge date: 12/13/2015  Time spent: 45 minutes  Recommendations for Outpatient Follow-up:  1. Dr.Ramaswamy on 5/11 for Bronchoscopy and FU, please FU on connective tissue disorder panel   Discharge Diagnoses:    Interstitial Lung disease   Mild Hypoxia   UTI (lower urinary tract infection)   Bronchitis   Hypoxia   Coronary artery disease   Diabetes mellitus without complication ([place])   Hypertension   DVT (deep venous thrombosis) ([facility])   ILD (interstitial lung disease) ([place])   Acute on chronic respiratory failure with hypoxia ([place])   Discharge Condition:stable  Diet recommendation: diabetic, heart healthy  Filed Weights   12/11/15 1805  Weight: 65.46 kg (144 lb 5 oz)    History of present illness:  Chief Complaint: Increased SOB HPI: Stacy Contreras is a 75 y.o. female with a history of CAD, HTN, DM2, DVT, and GERD who presents to the ED with complaints of increased SOB and Cough x 5-6 months. She has also had recent Epigastric Pain as well and was prescribed Dexilant Rx which only partially relieved her symptoms. She was evaluated in the ED and was found to have hypoxemia with O2 sats of 84%, and was placed on NCO2. A CT scan of the chest and ABD were performed and revealed findings of Interstitial ground glass opacities in both lungs.  Hospital Course:   Acute on Chronic Hypoxic resp failure/Interstitial Lung disease -CT concerning for interstitial lung disease -Pulm consult appreciated, BNP was 104 and ECHO was a limited study but EF was normal, full report requested, unable to tell PA pressures based on current report. -ESR was 32, lactate and pro calcitonin were normal -ANA, ACE level, Sjogrens Ab, antiDS Dna, aldolase, RF, ANCa all pending at the time of discharge -ambulated in halls and O2 sats were above 95% on  room air and hence didn't qualify for Home O2 today. -Seen by Maudie Flakes, it was felt that she was stable enough to be discharged home today with close Pulm FU, will have a Bronchoscopy on Thursday, advised to hold her Apixaban dose the previous day per [facility] and may need Biopsy if above workup not diagnostic   Coronary artery disease -stable, continue ASA/Atenolol   Diabetes mellitus without complication - resumed metformin and januvia -  HbA1C was 6.5   Hypertension -continue Amlodipine, Atenolol, and Losartan Rx  H/o DVT (deep venous thrombosis)  -on apixaban   Consultations:  Pulm Dr.Ramaswamy  Discharge Exam: Filed Vitals:   12/13/15 1042 12/13/15 1356  BP: 129/70 137/51  Pulse: 61 63  Temp:  97.5 F (36.4 C)  Resp:  16    General: AAOx3 Cardiovascular: S1S2/RRR Respiratory: fine basilar crackes  Discharge Instructions   Discharge Instructions    Diet - low sodium heart healthy    Complete by:  As directed      Increase activity slowly    Complete by:  As directed           Discharge Medication List as of 12/13/2015 12:28 PM    START taking these medications   Details  HYDROcodone-homatropine (HYCODAN) 5-1.5 MG/5ML syrup Take 5 mLs by mouth every 6 (six) hours as needed for cough., Starting 12/13/2015, Until Discontinued, Print      CONTINUE these medications which have CHANGED   Details  apixaban (ELIQUIS) 5 MG TABS tablet Take 0.5 tablets (2.5  mg total) by mouth 2 (two) times daily. Hold on Eliquis the day prior to Bronchoscopy, Starting 12/13/2015, Until Discontinued, No Print      CONTINUE these medications which have NOT CHANGED   Details  albuterol (PROVENTIL HFA;VENTOLIN HFA) 108 (90 Base) MCG/ACT inhaler Inhale 2 puffs into the lungs every 4 (four) hours as needed for wheezing or shortness of breath., Until Discontinued, Historical Med    amLODipine (NORVASC) 5 MG tablet Take 5 mg by mouth daily., Until Discontinued, Historical  Med    aspirin EC 81 MG tablet Take 81 mg by mouth daily., Until Discontinued, Historical Med    atenolol (TENORMIN) 25 MG tablet Take 25 mg by mouth 2 (two) times daily., Until Discontinued, Historical Med    Black Cohosh 40 MG CAPS Take 120 mg by mouth 2 (two) times daily., Until Discontinued, Historical Med    dexlansoprazole (DEXILANT) 60 MG capsule Take 60 mg by mouth daily., Until Discontinued, Historical Med    docusate sodium (COLACE) 100 MG capsule Take 200 mg by mouth daily as needed for mild constipation., Until Discontinued, Historical Med    Fluticasone-Salmeterol (ADVAIR) 250-50 MCG/DOSE AEPB Inhale 1 puff into the lungs 2 (two) times daily., Until Discontinued, Historical Med    gabapentin (NEURONTIN) 300 MG capsule Take 600 mg by mouth every evening., Until Discontinued, Historical Med    isosorbide mononitrate (IMDUR) 30 MG 24 hr tablet Take 30 mg by mouth daily., Until Discontinued, Historical Med    losartan (COZAAR) 100 MG tablet Take 100 mg by mouth at bedtime., Until Discontinued, Historical Med    lubiprostone (AMITIZA) 24 MCG capsule Take 24 mcg by mouth 2 (two) times daily with a meal., Until Discontinued, Historical Med    metFORMIN (GLUCOPHAGE) 500 MG tablet Take 1,000 mg by mouth 2 (two) times daily with a meal., Until Discontinued, Historical Med    PARoxetine (PAXIL) 20 MG tablet Take 10 mg by mouth daily., Until Discontinued, Historical Med    sitaGLIPtin (JANUVIA) 100 MG tablet Take 100 mg by mouth every evening., Until Discontinued, Historical Med    zolpidem (AMBIEN) 10 MG tablet Take 10 mg by mouth at bedtime as needed for sleep., Until Discontinued, Historical Med       Allergies  Allergen Reactions  . Codeine Other (See Comments)    Fast heart beat  . Statins     Muscle weakness  . Sulfa Antibiotics Rash   Follow-up Information    Follow up with [facility], MD On 12/19/2015.   Specialty:  Pulmonary Disease   Why:  Office will call  you with details   Contact information:   [place] [address] [phone]        The results of significant diagnostics from this hospitalization (including imaging, microbiology, ancillary and laboratory) are listed below for reference.    Significant Diagnostic Studies: Dg Chest 2 View  12/11/2015  CLINICAL DATA:  Pneumonia. Productive cough and shortness of breath. History of asthma. EXAM: CHEST  2 VIEW COMPARISON:  None. FINDINGS: Normal cardiac silhouette and mediastinal contours with atherosclerotic plaque within the thoracic aorta. Calcified lymph nodes are noted about the bilateral hila, left greater than right with scattered bilateral punctate granuloma. There is mild diffuse slightly nodular thickening of the pulmonary interstitium. No focal airspace opacities. There is minimal pleural parenchymal thickening about the right minor and bilateral major fissures. No pleural effusion or pneumothorax. No evidence of edema. No acute osseus abnormalities. Post cholecystectomy. IMPRESSION: 1. Findings suggestive  of airways disease / bronchitis. No focal airspace opacities to suggest pneumonia. 2. Sequela of prior granulomatous infection. Electronically Signed   By: Sandi Mariscal M.D.   On: 12/11/2015 17:47   Ct Chest W Contrast  12/11/2015  CLINICAL DATA:  75 year old with approximate 3 month history of cough and recent onset of left-sided abdominal pain and hematuria. EXAM: CT CHEST, ABDOMEN, AND PELVIS WITH CONTRAST TECHNIQUE: Multidetector CT imaging of the chest, abdomen and pelvis was performed following the standard protocol during bolus administration of intravenous contrast. CONTRAST:  130m ISOVUE-300 IOPAMIDOL 61% IV. Oral contrast was also administered. COMPARISON:  None. FINDINGS: CT CHEST FINDINGS Cardiovascular: Heart mildly enlarged. Severe LAD coronary atherosclerosis. No pericardial effusion. Severe atherosclerosis involving the thoracic aorta without evidence of  aneurysm. Atherosclerosis at the origin of the left subclavian artery without significant stenosis. Mediastinum/Lymph Nodes: Numerous calcified lymph nodes throughout the mediastinum. Noncalcified prevascular lymph nodes of normal size. No pathologic lymphadenopathy. Normal esophagus. No mediastinal masses. Visualized thyroid gland unremarkable. Lungs/Pleura: Ground-glass airspace opacities throughout both lungs, associated with interstitial opacities throughout both lungs which sure most prominent in the lower lobes. No solid nodules or masses. No pleural effusions. Musculoskeletal: Degenerative disc disease and spondylosis at T9-10. No acute abnormalities. CT ABDOMEN PELVIS FINDINGS Hepatobiliary: Mild diffuse hepatic steatosis without focal hepatic parenchymal abnormality. Gallbladder surgically absent. No biliary ductal dilation. Pancreas: Normal in appearance without evidence of mass, ductal dilation, or inflammation. Spleen: Normal in size and appearance. Adrenals/Urinary Tract: Approximate 1.1 x 1.7 x 1.3 cm low-attenuation nodule arising from the left adrenal gland. Normal right adrenal gland. Bilateral extrarenal pelves. Subcentimeter simple cyst arising from the lower pole of the left kidney. No significant focal parenchymal abnormality involving either kidney. No urinary tract calculi or obstruction. Normal-appearing urinary bladder. Stomach/Bowel: Stomach normal in appearance for the degree of distention. Normal-appearing small bowel. Mobile cecum positioned in the right upper quadrant. Entire colon decompressed containing liquid stool. Scattered sigmoid colon diverticula without evidence of acute diverticulitis. Appendix not visualized but no pericecal inflammation. Vascular/Lymphatic: Severe aortoiliofemoral atherosclerosis without aneurysm. Visceral arteries atherosclerotic though patent. No pathologic lymphadenopathy. Reproductive: Surgically absent uterus.  No adnexal masses. Other: Numerous pelvic  phleboliths. Very small umbilical hernia containing fat. Musculoskeletal: Severe degenerative disc disease L5-S1 with chronic disc protrusion as there is calcification in the posterior annular fibers. IMPRESSION: 1. Ground-glass opacities throughout both lungs associated with interstitial opacities, a nonspecific finding which can be seen in patients with pulmonary edema or with acute pulmonary inflammatory disorders. 2. No acute abnormalities involving the abdomen or pelvis. 3. 1.7 cm left adrenal nodule, statistically a small adenoma. 4. Sigmoid colon diverticulosis without evidence of acute diverticulitis. 5. Mild diffuse hepatic steatosis. Electronically Signed   By: TEvangeline DakinM.D.   On: 12/11/2015 19:55   Ct Abdomen Pelvis W Contrast  12/11/2015  CLINICAL DATA:  75year old with approximate 3 month history of cough and recent onset of left-sided abdominal pain and hematuria. EXAM: CT CHEST, ABDOMEN, AND PELVIS WITH CONTRAST TECHNIQUE: Multidetector CT imaging of the chest, abdomen and pelvis was performed following the standard protocol during bolus administration of intravenous contrast. CONTRAST:  1085mISOVUE-300 IOPAMIDOL 61% IV. Oral contrast was also administered. COMPARISON:  None. FINDINGS: CT CHEST FINDINGS Cardiovascular: Heart mildly enlarged. Severe LAD coronary atherosclerosis. No pericardial effusion. Severe atherosclerosis involving the thoracic aorta without evidence of aneurysm. Atherosclerosis at the origin of the left subclavian artery without significant stenosis. Mediastinum/Lymph Nodes: Numerous calcified lymph nodes throughout the mediastinum. Noncalcified prevascular lymph  nodes of normal size. No pathologic lymphadenopathy. Normal esophagus. No mediastinal masses. Visualized thyroid gland unremarkable. Lungs/Pleura: Ground-glass airspace opacities throughout both lungs, associated with interstitial opacities throughout both lungs which sure most prominent in the lower lobes. No  solid nodules or masses. No pleural effusions. Musculoskeletal: Degenerative disc disease and spondylosis at T9-10. No acute abnormalities. CT ABDOMEN PELVIS FINDINGS Hepatobiliary: Mild diffuse hepatic steatosis without focal hepatic parenchymal abnormality. Gallbladder surgically absent. No biliary ductal dilation. Pancreas: Normal in appearance without evidence of mass, ductal dilation, or inflammation. Spleen: Normal in size and appearance. Adrenals/Urinary Tract: Approximate 1.1 x 1.7 x 1.3 cm low-attenuation nodule arising from the left adrenal gland. Normal right adrenal gland. Bilateral extrarenal pelves. Subcentimeter simple cyst arising from the lower pole of the left kidney. No significant focal parenchymal abnormality involving either kidney. No urinary tract calculi or obstruction. Normal-appearing urinary bladder. Stomach/Bowel: Stomach normal in appearance for the degree of distention. Normal-appearing small bowel. Mobile cecum positioned in the right upper quadrant. Entire colon decompressed containing liquid stool. Scattered sigmoid colon diverticula without evidence of acute diverticulitis. Appendix not visualized but no pericecal inflammation. Vascular/Lymphatic: Severe aortoiliofemoral atherosclerosis without aneurysm. Visceral arteries atherosclerotic though patent. No pathologic lymphadenopathy. Reproductive: Surgically absent uterus.  No adnexal masses. Other: Numerous pelvic phleboliths. Very small umbilical hernia containing fat. Musculoskeletal: Severe degenerative disc disease L5-S1 with chronic disc protrusion as there is calcification in the posterior annular fibers. IMPRESSION: 1. Ground-glass opacities throughout both lungs associated with interstitial opacities, a nonspecific finding which can be seen in patients with pulmonary edema or with acute pulmonary inflammatory disorders. 2. No acute abnormalities involving the abdomen or pelvis. 3. 1.7 cm left adrenal nodule, statistically a  small adenoma. 4. Sigmoid colon diverticulosis without evidence of acute diverticulitis. 5. Mild diffuse hepatic steatosis. Electronically Signed   By: Evangeline Dakin M.D.   On: 12/11/2015 19:55    Microbiology: No results found for this or any previous visit (from the past 240 hour(s)).   Labs: Basic Metabolic Panel:  Recent Labs Lab 12/11/15 1806 12/12/15 0432  NA 137 142  K 3.4* 4.3  CL 103 107  CO2 24 25  GLUCOSE 124* 207*  BUN 13 9  CREATININE 0.74 0.68  CALCIUM 9.0 9.0   Liver Function Tests:  Recent Labs Lab 12/11/15 1806  AST 14*  ALT 13*  ALKPHOS 52  BILITOT 0.6  PROT 6.7  ALBUMIN 3.4*    Recent Labs Lab 12/11/15 1805  LIPASE 30   No results for input(s): AMMONIA in the last 168 hours. CBC:  Recent Labs Lab 12/11/15 1805 12/12/15 0432  WBC 10.7* 7.0  NEUTROABS 8.8*  --   HGB 11.9* 11.9*  HCT 34.2* 34.5*  MCV 90.2 90.1  PLT 258 265   Cardiac Enzymes:  Recent Labs Lab 12/12/15 0912 12/12/15 1009  CKTOTAL  --  13*  CKMB  --  8.2*  TROPONINI <0.03  --    BNP: BNP (last 3 results)  Recent Labs  12/12/15 0432  BNP 104.4*    ProBNP (last 3 results) No results for input(s): PROBNP in the last 8760 hours.  CBG:  Recent Labs Lab 12/12/15 1136 12/12/15 1642 12/12/15 2222 12/13/15 0856 12/13/15 1151  GLUCAP 236* 165* 190* 114* 140*       Signed:  Taron Mondor MD.  [facility] 12/13/2015, 4:51 PM

## 2015-12-14 LAB — ANTI-SCLERODERMA ANTIBODY: Scleroderma (Scl-70) (ENA) Antibody, IgG: 0.2 AI (ref 0.0–0.9)

## 2015-12-14 LAB — SJOGRENS SYNDROME-B EXTRACTABLE NUCLEAR ANTIBODY: SSB (La) (ENA) Antibody, IgG: <0.2 AI (ref 0.0–0.9)

## 2015-12-14 LAB — RHEUMATOID FACTOR: Rhuematoid fact SerPl-aCnc: 15.4 IU/mL — ABNORMAL HIGH (ref 0.0–13.9)

## 2015-12-14 LAB — SJOGRENS SYNDROME-A EXTRACTABLE NUCLEAR ANTIBODY: SSA (Ro) (ENA) Antibody, IgG: <0.2 AI (ref 0.0–0.9)

## 2015-12-15 LAB — ALDOLASE: Aldolase: 4.8 U/L (ref 3.3–10.3)

## 2015-12-15 LAB — CYCLIC CITRUL PEPTIDE ANTIBODY, IGG/IGA: CCP Antibodies IgG/IgA: 6 units (ref 0–19)

## 2015-12-15 LAB — ANGIOTENSIN CONVERTING ENZYME: Angiotensin-Converting Enzyme: 29 U/L (ref 14–82)

## 2015-12-16 ENCOUNTER — Telehealth: Payer: Self-pay | Admitting: Internal Medicine

## 2015-12-16 LAB — ANCA TITERS
Atypical P-ANCA titer: <1:20 {titer}
C-ANCA: <1:20 {titer}
P-ANCA: <1:20 {titer}

## 2015-12-16 LAB — GLOMERULAR BASEMENT MEMBRANE ANTIBODIES: GBM Ab: 3 units (ref 0–20)

## 2015-12-16 LAB — MPO/PR-3 (ANCA) ANTIBODIES
ANCA Proteinase 3: <3.5 U/mL (ref 0.0–3.5)
Myeloperoxidase Abs: <9 U/mL (ref 0.0–9.0)

## 2015-12-16 LAB — ANTINUCLEAR ANTIBODIES, IFA: ANA Ab, IFA: NEGATIVE

## 2015-12-16 NOTE — Telephone Encounter (Signed)
Patient aware. Please advise where to add on your schedule, you are booked up.

## 2015-12-16 NOTE — Telephone Encounter (Signed)
Let Stacy LenisAnnie L Contreras know that blood work for autoimmune diseas is negative. So, I plan to do bronch Thursday this week. Please give her followup appt some 1-2 weeks after bronch to see me or an app to disccuss reslts

## 2015-12-17 NOTE — Telephone Encounter (Signed)
Your next available appointment is on 01/14/16 at 12pm.

## 2015-12-17 NOTE — Telephone Encounter (Signed)
When is my first available?

## 2015-12-18 ENCOUNTER — Encounter (HOSPITAL_COMMUNITY): Payer: Self-pay

## 2015-12-19 ENCOUNTER — Ambulatory Visit (HOSPITAL_COMMUNITY)
Admission: RE | Admit: 2015-12-19 | Discharge: 2015-12-19 | Disposition: A | Discharge status: Home / Self Care | Payer: Medicare Other | Source: Ambulatory Visit | Attending: Internal Medicine | Admitting: Internal Medicine

## 2015-12-19 ENCOUNTER — Ambulatory Visit (HOSPITAL_COMMUNITY)
Admit: 2015-12-19 | Discharge: 2015-12-19 | Disposition: A | Discharge status: Home / Self Care | Payer: Medicare Other | Attending: Internal Medicine | Admitting: Internal Medicine

## 2015-12-19 ENCOUNTER — Encounter (HOSPITAL_COMMUNITY): Payer: Self-pay | Admitting: Internal Medicine

## 2015-12-19 ENCOUNTER — Encounter (HOSPITAL_COMMUNITY)
Admission: RE | Disposition: A | Discharge status: Home / Self Care | Payer: Self-pay | Source: Ambulatory Visit | Attending: Internal Medicine

## 2015-12-19 DIAGNOSIS — Z86718 Personal history of other venous thrombosis and embolism: Secondary | ICD-10-CM | POA: Insufficient documentation

## 2015-12-19 DIAGNOSIS — R918 Other nonspecific abnormal finding of lung field: Secondary | ICD-10-CM | POA: Insufficient documentation

## 2015-12-19 DIAGNOSIS — Z955 Presence of coronary angioplasty implant and graft: Secondary | ICD-10-CM | POA: Insufficient documentation

## 2015-12-19 DIAGNOSIS — J849 Interstitial pulmonary disease, unspecified: Secondary | ICD-10-CM | POA: Diagnosis not present

## 2015-12-19 DIAGNOSIS — R05 Cough: Secondary | ICD-10-CM | POA: Insufficient documentation

## 2015-12-19 DIAGNOSIS — I2510 Atherosclerotic heart disease of native coronary artery without angina pectoris: Secondary | ICD-10-CM | POA: Insufficient documentation

## 2015-12-19 DIAGNOSIS — J9620 Acute and chronic respiratory failure, unspecified whether with hypoxia or hypercapnia: Secondary | ICD-10-CM | POA: Insufficient documentation

## 2015-12-19 DIAGNOSIS — E119 Type 2 diabetes mellitus without complications: Secondary | ICD-10-CM | POA: Diagnosis not present

## 2015-12-19 DIAGNOSIS — I10 Essential (primary) hypertension: Secondary | ICD-10-CM | POA: Insufficient documentation

## 2015-12-19 DIAGNOSIS — J9621 Acute and chronic respiratory failure with hypoxia: Secondary | ICD-10-CM

## 2015-12-19 HISTORY — PX: VIDEO BRONCHOSCOPY: SHX5072

## 2015-12-19 LAB — BODY FLUID CELL COUNT WITH DIFFERENTIAL
Eos, Fluid: 0 %
Lymphs, Fluid: 34 %
Monocyte-Macrophage-Serous Fluid: 19 % — ABNORMAL LOW (ref 50–90)
Neutrophil Count, Fluid: 47 % — ABNORMAL HIGH (ref 0–25)
Total Nucleated Cell Count, Fluid: 104 cu mm (ref 0–1000)

## 2015-12-19 LAB — GLUCOSE, CAPILLARY
Glucose-Capillary: 112 mg/dL — ABNORMAL HIGH (ref 65–99)
Glucose-Capillary: 117 mg/dL — ABNORMAL HIGH (ref 65–99)

## 2015-12-19 LAB — PNEUMOCYSTIS JIROVECI SMEAR BY DFA: Pneumocystis jiroveci Ag: NEGATIVE

## 2015-12-19 SURGERY — VIDEO BRONCHOSCOPY WITHOUT FLUORO
Anesthesia: Moderate Sedation | Laterality: Bilateral

## 2015-12-19 MED ORDER — LIDOCAINE HCL 2 % EX GEL
CUTANEOUS | Status: DC | PRN
  Administered 2015-12-19: 1

## 2015-12-19 MED ORDER — LIDOCAINE HCL 2 % EX GEL: 1.0000 "application " | Freq: Once | CUTANEOUS | Status: DC

## 2015-12-19 MED ORDER — LIDOCAINE HCL 1 % IJ SOLN
INTRAMUSCULAR | Status: DC | PRN
  Administered 2015-12-19: 6 mL via RESPIRATORY_TRACT

## 2015-12-19 MED ORDER — BUTAMBEN-TETRACAINE-BENZOCAINE 2-2-14 % EX AERO: 1.0000 | INHALATION_SPRAY | Freq: Once | CUTANEOUS | Status: DC

## 2015-12-19 MED ORDER — MIDAZOLAM HCL 10 MG/2ML IJ SOLN
INTRAMUSCULAR | Status: DC | PRN
  Administered 2015-12-19 (×2): 2 mg via INTRAVENOUS

## 2015-12-19 MED ORDER — FENTANYL CITRATE (PF) 100 MCG/2ML IJ SOLN
INTRAMUSCULAR | Status: DC | PRN
  Administered 2015-12-19 (×2): 50 ug via INTRAVENOUS

## 2015-12-19 MED ORDER — MIDAZOLAM HCL 5 MG/ML IJ SOLN
INTRAMUSCULAR | Status: AC
  Filled 2015-12-19: qty 2

## 2015-12-19 MED ORDER — FENTANYL CITRATE (PF) 100 MCG/2ML IJ SOLN
INTRAMUSCULAR | Status: AC
Start: 2015-12-19 — End: 2015-12-19
  Filled 2015-12-19: qty 4

## 2015-12-19 MED ORDER — PHENYLEPHRINE HCL 0.25 % NA SOLN
NASAL | Status: DC | PRN
  Administered 2015-12-19: 2 via NASAL

## 2015-12-19 MED ORDER — SODIUM CHLORIDE 0.9 % IV SOLN
INTRAVENOUS | Status: DC
  Administered 2015-12-19: 08:00:00 via INTRAVENOUS

## 2015-12-19 MED ORDER — PHENYLEPHRINE HCL 0.25 % NA SOLN: 1.0000 | Freq: Four times a day (QID) | NASAL | Status: DC | PRN

## 2015-12-19 NOTE — H&P (View-Only) (Signed)
PULMONARY / CRITICAL CARE MEDICINE   Name: Stacy Contreras MRN: [id number] DOB: 11/13/1940    ADMISSION DATE:  12/11/2015 CONSULTATION DATE:  12/12/15  REFERRING MD:  [facility] - Dr Lovell Sheehan and Jomarie Longs  CHIEF COMPLAINT:   - dyspnea, cough, exertional desats  HISTORY OF PRESENT ILLNESS:   - 75 year old female from [place]. 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 Reports in the 1970s she had diagnosis  of systemic lupus erythematosus affecting the kidneys and  the joints. She was seen at [facility] and was on an experiment drug not otherwise specified for 6 months. After that she never had any problems lupus. Then in 2014 she had shortness of breath and had coronary stenting to the LAD but still had class III-for dyspnea and had repeat coronary stenting after which she significantly improved class II dyspnea on exertion which has been stable. She was told at that time she did not have any pneumonia but she does not recollect any imaging at that time. Then in November 2016 started having some abdominal pain and worsening shortness of breath associated with new onset of cough since February 2017. Symptoms been progressive. Class III levels. Cough is dry. Present day and night. She presents to the emergency department at [facility] and was found to be hypoxemic at rest and started on nasal cannula oxygen. At no point she's had a fever. She did get treated for pneumonia in February 2017 but she says she never had any pneumonia symptoms of flu symptoms and therefore the diagnosis of pneumonia was presumed based on his symptoms. She Recollects acid reflux evaluation with endoscopy and placement of ppi treatment.  She denies any exposure to hot tub or Jacuzzi or mold or mildew or feathered pillows. She does not have any birds in the house. She does not work with molasses or in a brewery. She denies any ongoing collagen-vascular disease.  CT scan of the chest showed groundglass  opacities that were scattered.   PAST MEDICAL HISTORY :  She  has a past medical history of Diabetes mellitus without complication (HCC); Coronary artery disease; Hypertension; and DVT (deep venous thrombosis) (HCC).  PAST SURGICAL HISTORY: She  has no past surgical history on file.  Allergies  Allergen Reactions  . Codeine Other (See Comments)    Fast heart beat  . Statins     Muscle weakness  . Sulfa Antibiotics Rash    No current facility-administered medications on file prior to encounter.   No current outpatient prescriptions on file prior to encounter.    FAMILY HISTORY:  Her has no family status information on file.   SOCIAL HISTORY: She  reports that she has never smoked. She does not have any smokeless tobacco history on file. She reports that she does not drink alcohol.  REVIEW OF SYSTEMS:   Detailed 11 point review of systems not possible because of focus history but is as detailed in the history of present illness otherwise negative.  VITAL SIGNS: BP 107/46 mmHg  Pulse 66  Temp(Src) 97.7 F (36.5 C) (Oral)  Resp 18  Ht 5\' 5"  (1.651 m)  Wt 65.46 kg (144 lb 5 oz)  BMI 24.01 kg/m2  SpO2 95%  HEMODYNAMICS:    VENTILATOR SETTINGS:    INTAKE / OUTPUT: I/O last 3 completed shifts: In: -  Out: 250 [Urine:250]  PHYSICAL EXAMINATION: General:  Looks well. Seated on the side of the bed. Neuro:  Alert and oriented 3. Speech normal  sitting normally HEENT:  Nasal cannula oxygen on. No elevated JVP no neck nodes Cardiovascular:  Regular rate and rhythm no murmurs Lungs:  Scattered crackles present no increased work of breathing Abdomen:  Soft nontender Musculoskeletal:  No cyanosis no clubbing no edema. No Raynaud's Skin:  Intact  LABS: PULMONARY No results for input(s): PHART, PCO2ART, PO2ART, HCO3, TCO2, O2SAT in the last 168 hours.  Invalid input(s): PCO2, PO2  CBC  Recent Labs Lab 12/11/15 1805 12/12/15 0432  HGB 11.9* 11.9*  HCT 34.2*  34.5*  WBC 10.7* 7.0  PLT 258 265    COAGULATION No results for input(s): INR in the last 168 hours.  CARDIAC  No results for input(s): TROPONINI in the last 168 hours. No results for input(s): PROBNP in the last 168 hours.   CHEMISTRY  Recent Labs Lab 12/11/15 1806 12/12/15 0432  NA 137 142  K 3.4* 4.3  CL 103 107  CO2 24 25  GLUCOSE 124* 207*  BUN 13 9  CREATININE 0.74 0.68  CALCIUM 9.0 9.0   Estimated Creatinine Clearance: 56.4 mL/min (by C-G formula based on Cr of 0.68).   LIVER  Recent Labs Lab 12/11/15 1806  AST 14*  ALT 13*  ALKPHOS 52  BILITOT 0.6  PROT 6.7  ALBUMIN 3.4*     INFECTIOUS  Recent Labs Lab 12/11/15 1817 12/11/15 2057  LATICACIDVEN 1.07 0.91     ENDOCRINE CBG (last 3)   Recent Labs  12/11/15 2320 12/12/15 0715  GLUCAP 121* 196*         IMAGING x48h  - image(s) personally visualized  -   highlighted in bold Dg Chest 2 View  12/11/2015  CLINICAL DATA:  Pneumonia. Productive cough and shortness of breath. History of asthma. EXAM: CHEST  2 VIEW COMPARISON:  None. FINDINGS: Normal cardiac silhouette and mediastinal contours with atherosclerotic plaque within the thoracic aorta. Calcified lymph nodes are noted about the bilateral hila, left greater than right with scattered bilateral punctate granuloma. There is mild diffuse slightly nodular thickening of the pulmonary interstitium. No focal airspace opacities. There is minimal pleural parenchymal thickening about the right minor and bilateral major fissures. No pleural effusion or pneumothorax. No evidence of edema. No acute osseus abnormalities. Post cholecystectomy. IMPRESSION: 1. Findings suggestive of airways disease / bronchitis. No focal airspace opacities to suggest pneumonia. 2. Sequela of prior granulomatous infection. Electronically Signed   By: Simonne Come M.D.   On: 12/11/2015 17:47   Ct Chest W Contrast  12/11/2015  CLINICAL DATA:  75 year old with approximate 3  month history of cough and recent onset of left-sided abdominal pain and hematuria. EXAM: CT CHEST, ABDOMEN, AND PELVIS WITH CONTRAST TECHNIQUE: Multidetector CT imaging of the chest, abdomen and pelvis was performed following the standard protocol during bolus administration of intravenous contrast. CONTRAST:  ISOVUE-300 IOPAMIDOL 61% IV. Oral contrast was also administered. COMPARISON:  None. FINDINGS: CT CHEST FINDINGS Cardiovascular: Heart mildly enlarged. Severe LAD coronary atherosclerosis. No pericardial effusion. Severe atherosclerosis involving the thoracic aorta without evidence of aneurysm. Atherosclerosis at the origin of the left subclavian artery without significant stenosis. Mediastinum/Lymph Nodes: Numerous calcified lymph nodes throughout the mediastinum. Noncalcified prevascular lymph nodes of normal size. No pathologic lymphadenopathy. Normal esophagus. No mediastinal masses. Visualized thyroid gland unremarkable. Lungs/Pleura: Ground-glass airspace opacities throughout both lungs, associated with interstitial opacities throughout both lungs which sure most prominent in the lower lobes. No solid nodules or masses. No pleural effusions. Musculoskeletal: Degenerative disc disease and spondylosis at T9-10. No  acute abnormalities. CT ABDOMEN PELVIS FINDINGS Hepatobiliary: Mild diffuse hepatic steatosis without focal hepatic parenchymal abnormality. Gallbladder surgically absent. No biliary ductal dilation. Pancreas: Normal in appearance without evidence of mass, ductal dilation, or inflammation. Spleen: Normal in size and appearance. Adrenals/Urinary Tract: Approximate 1.1 x 1.7 x 1.3 cm low-attenuation nodule arising from the left adrenal gland. Normal right adrenal gland. Bilateral extrarenal pelves. Subcentimeter simple cyst arising from the lower pole of the left kidney. No significant focal parenchymal abnormality involving either kidney. No urinary tract calculi or obstruction.  Normal-appearing urinary bladder. Stomach/Bowel: Stomach normal in appearance for the degree of distention. Normal-appearing small bowel. Mobile cecum positioned in the right upper quadrant. Entire colon decompressed containing liquid stool. Scattered sigmoid colon diverticula without evidence of acute diverticulitis. Appendix not visualized but no pericecal inflammation. Vascular/Lymphatic: Severe aortoiliofemoral atherosclerosis without aneurysm. Visceral arteries atherosclerotic though patent. No pathologic lymphadenopathy. Reproductive: Surgically absent uterus.  No adnexal masses. Other: Numerous pelvic phleboliths. Very small umbilical hernia containing fat. Musculoskeletal: Severe degenerative disc disease L5-S1 with chronic disc protrusion as there is calcification in the posterior annular fibers. IMPRESSION: 1. Ground-glass opacities throughout both lungs associated with interstitial opacities, a nonspecific finding which can be seen in patients with pulmonary edema or with acute pulmonary inflammatory disorders. 2. No acute abnormalities involving the abdomen or pelvis. 3. 1.7 cm left adrenal nodule, statistically a small adenoma. 4. Sigmoid colon diverticulosis without evidence of acute diverticulitis. 5. Mild diffuse hepatic steatosis. Electronically Signed   By: Hulan Saas M.D.   On: 12/11/2015 19:55   Ct Abdomen Pelvis W Contrast  12/11/2015  CLINICAL DATA:  75 year old with approximate 3 month history of cough and recent onset of left-sided abdominal pain and hematuria. EXAM: CT CHEST, ABDOMEN, AND PELVIS WITH CONTRAST TECHNIQUE: Multidetector CT imaging of the chest, abdomen and pelvis was performed following the standard protocol during bolus administration of intravenous contrast. CONTRAST:  ISOVUE-300 IOPAMIDOL 61% IV. Oral contrast was also administered. COMPARISON:  None. FINDINGS: CT CHEST FINDINGS Cardiovascular: Heart mildly enlarged. Severe LAD coronary atherosclerosis. No  pericardial effusion. Severe atherosclerosis involving the thoracic aorta without evidence of aneurysm. Atherosclerosis at the origin of the left subclavian artery without significant stenosis. Mediastinum/Lymph Nodes: Numerous calcified lymph nodes throughout the mediastinum. Noncalcified prevascular lymph nodes of normal size. No pathologic lymphadenopathy. Normal esophagus. No mediastinal masses. Visualized thyroid gland unremarkable. Lungs/Pleura: Ground-glass airspace opacities throughout both lungs, associated with interstitial opacities throughout both lungs which sure most prominent in the lower lobes. No solid nodules or masses. No pleural effusions. Musculoskeletal: Degenerative disc disease and spondylosis at T9-10. No acute abnormalities. CT ABDOMEN PELVIS FINDINGS Hepatobiliary: Mild diffuse hepatic steatosis without focal hepatic parenchymal abnormality. Gallbladder surgically absent. No biliary ductal dilation. Pancreas: Normal in appearance without evidence of mass, ductal dilation, or inflammation. Spleen: Normal in size and appearance. Adrenals/Urinary Tract: Approximate 1.1 x 1.7 x 1.3 cm low-attenuation nodule arising from the left adrenal gland. Normal right adrenal gland. Bilateral extrarenal pelves. Subcentimeter simple cyst arising from the lower pole of the left kidney. No significant focal parenchymal abnormality involving either kidney. No urinary tract calculi or obstruction. Normal-appearing urinary bladder. Stomach/Bowel: Stomach normal in appearance for the degree of distention. Normal-appearing small bowel. Mobile cecum positioned in the right upper quadrant. Entire colon decompressed containing liquid stool. Scattered sigmoid colon diverticula without evidence of acute diverticulitis. Appendix not visualized but no pericecal inflammation. Vascular/Lymphatic: Severe aortoiliofemoral atherosclerosis without aneurysm. Visceral arteries atherosclerotic though patent. No pathologic  lymphadenopathy. Reproductive: Surgically absent  uterus.  No adnexal masses. Other: Numerous pelvic phleboliths. Very small umbilical hernia containing fat. Musculoskeletal: Severe degenerative disc disease L5-S1 with chronic disc protrusion as there is calcification in the posterior annular fibers. IMPRESSION: 1. Ground-glass opacities throughout both lungs associated with interstitial opacities, a nonspecific finding which can be seen in patients with pulmonary edema or with acute pulmonary inflammatory disorders. 2. No acute abnormalities involving the abdomen or pelvis. 3. 1.7 cm left adrenal nodule, statistically a small adenoma. 4. Sigmoid colon diverticulosis without evidence of acute diverticulitis. 5. Mild diffuse hepatic steatosis. Electronically Signed   By: Hulan Saashomas  Lawrence M.D.   On: 12/11/2015 19:55       ASSESSMENT / PLAN:  PULMONARY A: Acute on chronic respiratory failure mild with nasal cannula oxygen requirement. In the setting of chronic cough and chronic shortness of breath and new diagnosis DIFFUSE ground glass opacities on high-resolution CT chest. Features suggestive of interstitial lung disease currently idiopathic. CT pattern is not consistent with UIP or IPF  - Differential diagnosis at this point is NSIP , autoimmune, , pulmonary alveolar proteinosis, tonic eosinophilic pneumonia P:   Check autoimmune and vasculitis profile Check angiotensin-converting enzyme for sarcoid Hold off on any steroids for the moment Check erythrocyte sedimentation rate Check echocardiogram Check pulmonary function test Check pro calcitonin, lactic acid and troponin   Based on the above consider bronchoscopy with lavage versus direct surgical lung biopsy especially if workup is negative  She and husband agree with the plan    Dr. Kalman ShanMurali Ulus Hazen, M.D., [facility] CityF[facility].P Pulmonary and Critical Care Medicine Staff Physician [facility] [place] [facility] [place] Pulmonary and Critical Care Pager: [phone], If no answer or between  15:00h - 7:00h: call [phone]  12/12/2015 9:35 AM

## 2015-12-19 NOTE — Telephone Encounter (Signed)
Give APP fu next week please - TP /SG

## 2015-12-19 NOTE — Telephone Encounter (Signed)
LM for pt Please schedule with TP/SG end of next week. Thanks.

## 2015-12-19 NOTE — Discharge Instructions (Signed)
Flexible Bronchoscopy, Care After These instructions give you information on caring for yourself after your procedure. Your doctor may also give you more specific instructions. Call your doctor if you have any problems or questions after your procedure. HOME CARE  Do not eat or drink anything for 2 hours after your procedure. If you try to eat or drink before the medicine wears off, food or drink could go into your lungs. You could also burn yourself.  After 2 hours have passed and when you can cough and gag normally, you may eat soft food and drink liquids slowly.  The day after the test, you may eat your normal diet.  You may do your normal activities.  Keep all doctor visits. GET HELP RIGHT AWAY IF:  You get more and more short of breath.  You get light-headed.  You feel like you are going to pass out (faint).  You have chest pain.  You have new problems that worry you.  You cough up more than a little blood.  You cough up more blood than before. MAKE SURE YOU:  Understand these instructions.  Will watch your condition.  Will get help right away if you are not doing well or get worse.   This information is not intended to replace advice given to you by your health care provider. Make sure you discuss any questions you have with your health care provider.   Document Released: 05/24/2009 Document Revised: 08/01/2013 Document Reviewed: 03/31/2013 Elsevier Interactive Patient Education 2016 Elsevier Inc.  Do not eat or drink until after 1000 today 12/19/15

## 2015-12-19 NOTE — Telephone Encounter (Signed)
Patient returned call, CB is [phone].

## 2015-12-19 NOTE — Progress Notes (Signed)
Video bronchoscopy performed Intervention bronchial washings Pt tolerated well  Jere Vanburen David RRT  

## 2015-12-19 NOTE — Interval H&P Note (Signed)
History and Physical Interval Note:  12/19/2015 8:03 AM  Stacy LenisAnnie L Contreras  has presented today for surgery, with the diagnosis of bronchitis  The various methods of treatment have been discussed with the patient and family. After consideration of risks, benefits and other options for treatment, the patient has consented to  Procedure(s): VIDEO BRONCHOSCOPY WITHOUT FLUORO (Bilateral) as a surgical intervention .  The patient's history has been reviewed, patient examined, no change in status, stable for surgery.  I have reviewed the patient's chart and labs.  Questions were answered to the patient's satisfaction.    Eliquis n hold sine yesterday. Still dysneic since dsicharge   Objective Vitals reviewed excam non focal  Stacy Contreras Dr. Kalman ShanMurali Mieshia Pepitone, M.D., [facility]aryF.[facility].C.P Pulmonary and Critical Care Medicine Staff Physician [place] [facility] [place] Pulmonary and Critical Care Pager: [phone], If no answer or between  15:00h - 7:00h: call [phone]  12/19/2015 8:04 AM

## 2015-12-19 NOTE — Telephone Encounter (Signed)
lmomtcb for pt on # [facility].

## 2015-12-19 NOTE — Telephone Encounter (Signed)
Pt has pending appt with SG on 12/26/15 at 11 am.  lmomtcb for pt to see if she has any further questions.

## 2015-12-19 NOTE — Op Note (Signed)
Name:  Stacy Contreras MRN:  [id number] DOB:  [birth year]  PROCEDURE NOTE  Procedure(s): Flexible bronchoscopy [phone]([id number]) Bronchial alveolar lavage [phone]([id number]) of the Right lower lobe   Indications:  Acute hypoxemic respiratory failure, ILD, Diffuse ground glass opacities  Consent:  Procedure, benefits, risks and alternatives discussed.  Questions answered.  Consent obtained.  Anesthesia:  Moderate sedation  Procedure summary:  Appropriate equipment was assembled.  The patient was brought to the operating room and identified as Stacy Contreras.  Safety timeout was performed. The patient was placed supine on the operating table, airway established and  Moderate sedation (fent 100 and versed 4) administered .   After the appropriate level of sedation  was assured, flexible video bronchoscope was lubricated and inserted through the right naris (she has hx of epistaxis in left narris)  Total of 30 mL of 1% Lidocaine were administered through the bronchoscope to augment general anesthesia.  Airway examination was performed bilaterally to subsegmental level.  Minimal clear secretions were noted, mucosa appeared normal and no endobronchial lesions were identified.  Bronchial alveolar lavage of the right lower lobe was performed with 120 mL of normal saline and return of 35 mL of clear normal looking fluid, after which the bronchoscope was withdrawn. \   After hemostasis was assure, the bronchoscope was withdrawn.  IMPRESSION 1. Normal airway exam 2. Right lower lobe BAL - normal macroscopic look  Specimens sent: Bronchial alveolar lavage specimen of the  Right lower lobefor cell count, microbiology and cytology.  Complications:  No immediate complications were noted.  Hemodynamic parameters and oxygenation remained stable throughout the procedure.  Estimated blood loss:  None   Dr. Kalman ShanMurali Derrel Moore, M.D., [facility]F.C.C.P Pulmonary and Critical Care Medicine Staff Physician  System [place]  Pulmonary and Critical Care Pager: [phone], If no answer or between  15:00h - 7:00h: call [phone]  12/19/2015 8:18 AM

## 2015-12-19 NOTE — Telephone Encounter (Signed)
Pt returning call and I'm making her and appointment w/ either TP or SG.Stacy Contreras

## 2015-12-20 ENCOUNTER — Encounter (HOSPITAL_COMMUNITY): Payer: Self-pay | Admitting: Internal Medicine

## 2015-12-20 LAB — ACID FAST SMEAR (AFB, MYCOBACTERIA): Acid Fast Smear: NEGATIVE

## 2015-12-22 LAB — CULTURE, BAL-QUANTITATIVE W GRAM STAIN
Culture: NO GROWTH
Special Requests: NORMAL

## 2015-12-26 ENCOUNTER — Telehealth: Payer: Self-pay | Admitting: Internal Medicine

## 2015-12-26 ENCOUNTER — Ambulatory Visit (INDEPENDENT_AMBULATORY_CARE_PROVIDER_SITE_OTHER): Payer: Medicare Other | Admitting: Acute Care

## 2015-12-26 ENCOUNTER — Encounter: Payer: Self-pay | Admitting: Acute Care

## 2015-12-26 VITALS — BP 100/54 | HR 64 | Temp 98.1°F | Ht 65.0 in | Wt 139.6 lb

## 2015-12-26 DIAGNOSIS — J849 Interstitial pulmonary disease, unspecified: Secondary | ICD-10-CM | POA: Diagnosis not present

## 2015-12-26 DIAGNOSIS — R0602 Shortness of breath: Secondary | ICD-10-CM

## 2015-12-26 MED ORDER — LEVOFLOXACIN 500 MG PO TABS: 500.0000 mg | ORAL_TABLET | Freq: Every day | ORAL | Status: DC

## 2015-12-26 MED ORDER — PREDNISONE 10 MG PO TABS: ORAL_TABLET | ORAL | Status: DC

## 2015-12-26 NOTE — Progress Notes (Signed)
History of Present Illness Stacy Contreras is a 75 y.o. female with ILD, and diffuse ground glass opacities followed by Dr. Marchelle Gearingamaswamy .  12/26/2015   Follow ZO:XWRUEAp:Bronch results  Follow up appointment post bronch for discussion of results and treatment plan for ILD. Pt. Presents to the office with  exertional dyspnea that she states is stable over the last several weeks.She is doing well since the bronchoscopy.I reviewed the lab work with Dr. Marchelle Gearingamaswamy. She was told that the bronch may not give us the definitive answer to the cause of her ILD.  Auto immune work up was negative. Additionally vasculitis workup was negative. Dr. Marchelle Gearingamaswamy suspects an inflammatory element. Per the BAL results, macrophages are low at  low 19% and neutrophils are elevated at 47%. We discussed that the best plan at this point, is to walk the patient in the office. If she does not desaturate, we will treat with antibiotic 7 days, and prednisone taper 25 days, followed by HRCT and appointment with Dr. Marchelle Gearingamaswamy to evaluate scan results after treatment. If she does desaturate, we will refer to thoracic surgery for biopsy.  Past medical hx Past Medical History  Diagnosis Date  . Diabetes mellitus without complication (HCC)   . Coronary artery disease   . Hypertension   . DVT (deep venous thrombosis) (HCC)   . History of breast cancer      Past surgical hx, Family hx, Social hx all reviewed.  Current Outpatient Prescriptions on File Prior to Visit  Medication Sig  . albuterol (PROVENTIL HFA;VENTOLIN HFA) 108 (90 Base) MCG/ACT inhaler Inhale 2 puffs into the lungs every 4 (four) hours as needed for wheezing or shortness of breath.  Marland Kitchen. amLODipine (NORVASC) 5 MG tablet Take 5 mg by mouth daily.  Marland Kitchen. apixaban (ELIQUIS) 5 MG TABS tablet Take 0.5 tablets (2.5 mg total) by mouth 2 (two) times daily. Hold on Eliquis the day prior to Bronchoscopy  . aspirin EC 81 MG tablet Take 81 mg by mouth daily.  Marland Kitchen. atenolol (TENORMIN) 25 MG  tablet Take 25 mg by mouth 2 (two) times daily.  . Black Cohosh 40 MG CAPS Take 120 mg by mouth 2 (two) times daily.  Marland Kitchen. dexlansoprazole (DEXILANT) 60 MG capsule Take 60 mg by mouth daily.  Marland Kitchen. docusate sodium (COLACE) 100 MG capsule Take 200 mg by mouth daily as needed for mild constipation.  . Fluticasone-Salmeterol (ADVAIR) 250-50 MCG/DOSE AEPB Inhale 1 puff into the lungs 2 (two) times daily.  Marland Kitchen. gabapentin (NEURONTIN) 300 MG capsule Take 600 mg by mouth every evening.  . isosorbide mononitrate (IMDUR) 30 MG 24 hr tablet Take 30 mg by mouth daily.  Marland Kitchen. losartan (COZAAR) 100 MG tablet Take 100 mg by mouth at bedtime.  Marland Kitchen. lubiprostone (AMITIZA) 24 MCG capsule Take 24 mcg by mouth 2 (two) times daily with a meal.  . metFORMIN (GLUCOPHAGE) 500 MG tablet Take 1,000 mg by mouth 2 (two) times daily with a meal.  . PARoxetine (PAXIL) 20 MG tablet Take 10 mg by mouth daily.  . sitaGLIPtin (JANUVIA) 100 MG tablet Take 100 mg by mouth every evening.  . zolpidem (AMBIEN) 10 MG tablet Take 10 mg by mouth at bedtime as needed for sleep.  Marland Kitchen. HYDROcodone-homatropine (HYCODAN) 5-1.5 MG/5ML syrup Take 5 mLs by mouth every 6 (six) hours as needed for cough. (Patient not taking: Reported on 12/26/2015)   No current facility-administered medications on file prior to visit.     Allergies  Allergen Reactions  .  Codeine Other (See Comments)    Fast heart beat  . Statins     Muscle weakness  . Sulfa Antibiotics Rash    Review Of Systems:  Constitutional:   No  weight loss, night sweats,  Fevers, chills, fatigue, or  lassitude.  HEENT:   No headaches,  Difficulty swallowing,  Tooth/dental problems, or  Sore throat,                No sneezing, itching, ear ache, nasal congestion, post nasal drip,   CV:  No chest pain,  Orthopnea, PND, swelling in lower extremities, anasarca, dizziness, palpitations, syncope.   GI  No heartburn, indigestion, abdominal pain, nausea, vomiting, diarrhea, change in bowel habits, loss  of appetite, bloody stools.   Resp: + shortness of breath with exertion less at rest.  No excess mucus, no productive cough,  No non-productive cough,  No coughing up of blood.  No change in color of mucus.  No wheezing.  No chest wall deformity  Skin: no rash or lesions.  GU: no dysuria, change in color of urine, no urgency or frequency.  No flank pain, no hematuria   MS:  No joint pain or swelling.  No decreased range of motion.  No back pain.  Psych:  No change in mood or affect. No depression or anxiety.  No memory loss.   Vital Signs BP 100/54 mmHg  Pulse 64  Temp(Src) 98.1 F (36.7 C) (Oral)  Ht  (1.651 m)  Wt 139 lb 9.6 oz (63.322 kg)  BMI 23.23 kg/m2  SpO2 98%   Physical Exam:  General- No distress,  A&Ox3, deconditioned ENT: No sinus tenderness, TM clear, pale nasal mucosa, no oral exudate,no post nasal drip, no LAN Cardiac: S1, S2, regular rate and rhythm, no murmur Chest: No wheeze/ +rales/no dullness; no accessory muscle use, no nasal flaring, no sternal retractions Abd.: Soft Non-tender Ext: No clubbing cyanosis, edema Neuro:  normal strength Skin: No rashes, warm and dry Psych: normal mood and behavior   Assessment/Plan  ILD (interstitial lung disease) (HCC) ILD work up after [employer] immune negative Vasculitis negative Suspect inflammatory ( Bacterial vs. Idiopathic) Plan: We will walk you in the office today to see if you desaturate. We will start Levaquin 500  mg once daily for 7 days Prednisone Taper: 40 mg/ day x 5 days then 30 mg/ day x 5 days, 20 mg/day x 5 days, 10 mg/day x 5 days 5 mg/day x 5 days then stop. After prednisone taper complete, we will schedule an HRCT to re-evaluate your ILD.( Same week taper is complete, June 17th) Follow up appointment with Dr. Marchelle Gearing after HRCT for further evaluation and planning. Refer to Thoracic surgery as needed for biopsy. Please contact office for sooner follow up if symptoms do not  improve or worsen or seek emergency care      Bevelyn Ngo, NP 12/26/2015  1:33 PM

## 2015-12-26 NOTE — Telephone Encounter (Signed)
Spoke with Joni ReiningNicole with [facility].  BAL leaked during transport to lab.  Therefore, they do not have specimen left to complete the legionella cx.  Calling as Lorain ChildesFYI - Will send to MR so he is aware.

## 2015-12-26 NOTE — Telephone Encounter (Signed)
That is fine. Very low prob that this was legionella. In fact I have never seen 1 psitive on bAl

## 2015-12-26 NOTE — Assessment & Plan Note (Addendum)
ILD work up after Jabil CircuitBronch Auto immune negative Vasculitis negative Suspect inflammatory ( Bacterial vs. Idiopathic) Plan: We will walk you in the office today to see if you desaturate. We will start Levaquin 500  mg once daily for 7 days Prednisone Taper: 40 mg/ day x 5 days then 30 mg/ day x 5 days, 20 mg/day x 5 days, 10 mg/day x 5 days 5 mg/day x 5 days then stop. After prednisone taper complete, we will schedule an HRCT to re-evaluate your ILD.( Same week taper is complete, June 17th) Follow up appointment with Dr. Marchelle Gearingamaswamy after HRCT for further evaluation and planning. Refer to Thoracic surgery as needed for biopsy. Please contact office for sooner follow up if symptoms do not improve or worsen or seek emergency care

## 2015-12-26 NOTE — Patient Instructions (Addendum)
It is nice to meet you today. We will walk you in the office today to see if you desaturate. We will start Levaquin 500 mg once daily for 7 days Prednisone Taper: 40 mg/ day x 5 days then 30 mg/ day x 5 days, 20 mg/day x 5 days, 10 mg/day x 5 days 5 mg/day x 5 days then stop. After prednisone taper complete, we will schedule an HRCT to re-evaluate your ILD.( Same week taper is complete) Follow up appointment with Dr. Marchelle Gearingamaswamy after HRCT First week July 17th  for further evaluation and planning. Please contact office for sooner follow up if symptoms do not improve or worsen or seek emergency care

## 2016-01-20 ENCOUNTER — Ambulatory Visit (INDEPENDENT_AMBULATORY_CARE_PROVIDER_SITE_OTHER)
Admission: RE | Admit: 2016-01-20 | Discharge: 2016-01-20 | Disposition: A | Discharge status: Home / Self Care | Payer: Medicare Other | Source: Ambulatory Visit | Attending: Internal Medicine | Admitting: Internal Medicine

## 2016-01-20 DIAGNOSIS — R0602 Shortness of breath: Secondary | ICD-10-CM

## 2016-01-24 ENCOUNTER — Other Ambulatory Visit: Payer: Self-pay | Admitting: Acute Care

## 2016-01-24 ENCOUNTER — Telehealth: Payer: Self-pay | Admitting: Internal Medicine

## 2016-01-24 ENCOUNTER — Telehealth: Payer: Self-pay | Admitting: Acute Care

## 2016-01-24 MED ORDER — PREDNISONE 10 MG PO TABS: ORAL_TABLET | ORAL | Status: DC

## 2016-01-24 NOTE — Telephone Encounter (Signed)
Spoke with pt. She is requesting results from her CT on 01/20/16. Pt does not want to wait until MR returns.  TP - please advise. Thanks.

## 2016-01-24 NOTE — Telephone Encounter (Signed)
I called and spoke with Ms. Laske about her HRCT results. I had reviewed the CT with Dr. Isaiah SergeMannam. The scan showed significant improvement atter the prednisone and Levaquin. She states that she felt better and had less dyspnea while on the prednisone. She has noted an increase in dyspnea since completing the taper, but it is not as bad as it had previously been. I told her that Dr. Isaiah SergeMannam and I had discussed continuing a shorter term prednisone taper and that I had [facility]. She has a follow up appointment with Dr. Marchelle Gearingamaswamy July 17th where he can re-evaluate maintenance dosing of prednisone or order a biopsy for tissue diagnosis of her lung disease. She verbalized understanding of the above and had no further questions when we ended the call. She has our contact information in the event she has questions in the future.

## 2016-01-24 NOTE — Telephone Encounter (Signed)
Huntley DecSara did you order this ?

## 2016-01-28 NOTE — Telephone Encounter (Signed)
MR ordered this CT. But SG called the pt on 01/24/2016 and reviewed these results with her. Message will be closed.

## 2016-02-01 LAB — ACID FAST CULTURE WITH REFLEXED SENSITIVITIES (MYCOBACTERIA): Acid Fast Culture: NEGATIVE

## 2016-02-07 NOTE — Telephone Encounter (Signed)
Pt was seen 12/26/15 by SG. Closing encounter.

## 2016-02-24 ENCOUNTER — Ambulatory Visit (INDEPENDENT_AMBULATORY_CARE_PROVIDER_SITE_OTHER): Payer: Medicare Other | Admitting: Internal Medicine

## 2016-02-24 ENCOUNTER — Encounter: Payer: Self-pay | Admitting: Internal Medicine

## 2016-02-24 VITALS — BP 134/68 | HR 60 | Ht 65.0 in | Wt 143.8 lb

## 2016-02-24 DIAGNOSIS — R0600 Dyspnea, unspecified: Secondary | ICD-10-CM | POA: Diagnosis not present

## 2016-02-24 DIAGNOSIS — J849 Interstitial pulmonary disease, unspecified: Secondary | ICD-10-CM

## 2016-02-24 DIAGNOSIS — R0689 Other abnormalities of breathing: Secondary | ICD-10-CM | POA: Diagnosis not present

## 2016-02-24 NOTE — Progress Notes (Signed)
Subjective:     Patient ID: Stacy Contreras, female   DOB: Sep 05, 1940, 75 y.o.   MRN: [id number]  HPI Stacy Contreras is a 75 y.o. female with ILD, and diffuse ground glass opacities followed by Dr. Chase Caller .  12/26/2015   Follow BT:DVVOHY results  Follow up appointment post bronch for discussion of results and treatment plan for ILD. Pt. Presents to the office with  exertional dyspnea that she states is stable over the last several weeks.She is doing well since the bronchoscopy.I reviewed the lab work with Dr. Chase Caller. She was told that the bronch may not give [place] the definitive answer to the cause of her ILD.  Auto immune work up was negative. Additionally vasculitis workup was negative. Dr. Chase Caller suspects an inflammatory element. Per the BAL results, macrophages are low at  low 19% and neutrophils are elevated at 47%. We discussed that the best plan at this point, is to walk the patient in the office. If she does not desaturate, we will treat with antibiotic 7 days, and prednisone taper 25 days, followed by HRCT and appointment with Dr. Chase Caller to evaluate scan results after treatment. If she does desaturate, we will refer to thoracic surgery for biopsy. \  OV 02/24/2016  Chief Complaint  Patient presents with  . Follow-up    review HRCT from 01/20/16.  Pt has good and bad days- bad days consist of SOB worse with exertion.      follow-up ILD not otherwise specified in May 2017  Early May 2017 bronchoscopy and autoimmune profile were noncontributory. She was placed on empiric prednisone. ESR at that time was 32. She had follow-up CT chest June 2017 on 01/20/2016. This showed significant improvement I personally visualized the CT chest and shows near resolution of infiltrates. However this was done while she was on prednisone. She took a total of 30 days prednisone. She was instructed to take some prednisone following the CT but now she's been off prednisone for 2 weeks. She says that  coming off the prednisone she feels more fatigue and random dyspnea. Dyspnea is definitely worse off prednisone and it happens randomly with her without exertion. Also associated with some voice changes. No associated chest pain. Last cardiac stress test was normal a year ago. She is due to see her cardiologist within the next month at [facility]. She will inquire with the cardiologist about a repeat stress test utility.  Walking desaturation test 185 feet 3 laps on room air today - DID not desturate  Exhaled nitric oxide on room air today 02/24/2016:  Noted she is on Advair for many years for her asthma and test done on asthma. SHE COULD NOT DO x  6itmes   IPRESSION: 1. Pulmonary parenchymal pattern of minimal scattered peribronchovascular nodularity and basilar mosaic attenuation/septal thickening, improved from 12/11/2015 but without complete resolution. Findings may be postinflammatory in etiology. Residual or recurrent edema or viral/atypical pneumonia are other considerations. Pattern is not overly specific for a particular type of interstitial lung disease. 2. Coronary artery calcification.   Electronically Signed  By: Lorin Picket M.D.  On: 01/20/2016 15:34         has a past medical history of Diabetes mellitus without complication ([place]); Coronary artery disease; Hypertension; DVT (deep venous thrombosis) ([place]); and History of breast cancer.   reports that she has never smoked. She has never used smokeless tobacco.  Past Surgical History  Procedure Laterality Date  . Video bronchoscopy Bilateral 12/19/2015    Procedure:  VIDEO BRONCHOSCOPY WITHOUT FLUORO;  Surgeon: Brand Males, MD;  Location: [facility];  Service: Endoscopy;  Laterality: Bilateral;  . Cholecystectomy    . Hysterectomy    . Right breast lumpectomy    . Appendectomy    . Left knee replacement    . Stents x 2      Allergies  Allergen Reactions  . Codeine Other (See Comments)    Fast  heart beat  . Statins     Muscle weakness  . Sulfa Antibiotics Rash    Immunization History  Administered Date(s) Administered  . Influenza-Unspecified 05/11/2015  . Pneumococcal Polysaccharide-23 08/10/2010    Family History  Problem Relation Age of Onset  . Lung disease Mother     ? brown lung  . Colon cancer Father   . Breast cancer Sister   . Breast cancer Daughter      Current outpatient prescriptions:  .  albuterol (PROVENTIL HFA;VENTOLIN HFA) 108 (90 Base) MCG/ACT inhaler, Inhale 2 puffs into the lungs every 4 (four) hours as needed for wheezing or shortness of breath., Disp: , Rfl:  .  amLODipine (NORVASC) 5 MG tablet, Take 5 mg by mouth daily., Disp: , Rfl:  .  apixaban (ELIQUIS) 5 MG TABS tablet, Take 0.5 tablets (2.5 mg total) by mouth 2 (two) times daily. Hold on Eliquis the day prior to Bronchoscopy, Disp: 60 tablet, Rfl:  .  aspirin EC 81 MG tablet, Take 81 mg by mouth daily., Disp: , Rfl:  .  atenolol (TENORMIN) 25 MG tablet, Take 25 mg by mouth 2 (two) times daily., Disp: , Rfl:  .  Black Cohosh 40 MG CAPS, Take 120 mg by mouth 2 (two) times daily., Disp: , Rfl:  .  dexlansoprazole (DEXILANT) 60 MG capsule, Take 60 mg by mouth daily., Disp: , Rfl:  .  docusate sodium (COLACE) 100 MG capsule, Take 200 mg by mouth daily as needed for mild constipation., Disp: , Rfl:  .  Fluticasone-Salmeterol (ADVAIR) 250-50 MCG/DOSE AEPB, Inhale 1 puff into the lungs 2 (two) times daily., Disp: , Rfl:  .  gabapentin (NEURONTIN) 300 MG capsule, Take 600 mg by mouth every evening., Disp: , Rfl:  .  HYDROcodone-homatropine (HYCODAN) 5-1.5 MG/5ML syrup, Take 5 mLs by mouth every 6 (six) hours as needed for cough., Disp: 120 mL, Rfl: 0 .  isosorbide mononitrate (IMDUR) 30 MG 24 hr tablet, Take 30 mg by mouth daily., Disp: , Rfl:  .  losartan (COZAAR) 100 MG tablet, Take 100 mg by mouth at bedtime., Disp: , Rfl:  .  lubiprostone (AMITIZA) 24 MCG capsule, Take 24 mcg by mouth 2 (two) times  daily with a meal., Disp: , Rfl:  .  metFORMIN (GLUCOPHAGE) 500 MG tablet, Take 1,000 mg by mouth 2 (two) times daily with a meal., Disp: , Rfl:  .  PARoxetine (PAXIL) 20 MG tablet, Take 10 mg by mouth daily., Disp: , Rfl:  .  sitaGLIPtin (JANUVIA) 100 MG tablet, Take 100 mg by mouth every evening., Disp: , Rfl:  .  zolpidem (AMBIEN) 10 MG tablet, Take 10 mg by mouth at bedtime as needed for sleep., Disp: , Rfl:      Review of Systems     Objective:   Physical Exam  Constitutional: She is oriented to person, place, and time. She appears well-developed and well-nourished. No distress.  Fatigued looking  HENT:  Head: Normocephalic and atraumatic.  Right Ear: External ear normal.  Left Ear: External ear normal.  Mouth/Throat: Oropharynx  is clear and moist. No oropharyngeal exudate.  Eyes: Conjunctivae and EOM are normal. Pupils are equal, round, and reactive to light. Right eye exhibits no discharge. Left eye exhibits no discharge. No scleral icterus.  Neck: Normal range of motion. Neck supple. No JVD present. No tracheal deviation present. No thyromegaly present.  Cardiovascular: Normal rate, regular rhythm, normal heart sounds and intact distal pulses.  Exam reveals no gallop and no friction rub.   No murmur heard. Pulmonary/Chest: Effort normal and breath sounds normal. No respiratory distress. She has no wheezes. She has no rales. She exhibits no tenderness.  Abdominal: Soft. Bowel sounds are normal. She exhibits no distension and no mass. There is no tenderness. There is no rebound and no guarding.  Musculoskeletal: Normal range of motion. She exhibits no edema or tenderness.  Lymphadenopathy:    She has no cervical adenopathy.  Neurological: She is alert and oriented to person, place, and time. She has normal reflexes. No cranial nerve deficit. She exhibits normal muscle tone. Coordination normal.  Skin: Skin is warm and dry. No rash noted. She is not diaphoretic. No erythema. No  pallor.  Psychiatric: She has a normal mood and affect. Her behavior is normal. Judgment and thought content normal.  Vitals reviewed.   Filed Vitals:   02/24/16 1210  BP: 134/68  Pulse: 60  Height: _0  (1.651 m)  Weight: 143 lb 12.8 oz (65.227 kg)  SpO2: 98%        Assessment:       ICD-9-CM ICD-10-CM   1. Dyspnea and respiratory abnormality 786.09 R06.00     R06.89   2. ILD (interstitial lung disease) ([place]) 515 J84.9     She continues to be symptomatic despite objective improvement on the CT chest. Clinically asthma appears under control. Objectively did not see any desaturation. We could not do exhaled nitric oxide to truly establish objectively when her asthma is right now while she is on Advair. Somewhat of a confusing picture because her dyspnea is gotten worse after she came off prednisone in June 2017/July 2017. The CT that showed improvement was while she was on prednisone. Does not sound cardiac but seems more like she is physically deconditioned and fatigued. We'll ideally like to know if the CT shows resolution of infiltrates while she is off prednisone for a few months this only been September 2017. At that point in time if she still dyspneic then she will need a Cardio  st pulmonaryress test. Meanwhile we will do pulmonary rehabilitation and have asked her to ask cardiology she needs a cardiac stress test      Plan:      Unclear why still so symptomatic despite optimal asthma treatment and improvement in CT chest June 2017  Plan  - discuss with cardiologist at [place], [place] about utility of doing repeat cardiac stress test - continue advair as before - consider pulmonary rehab at [facility] once okayed by cardiologist; will refer  - do repeat HRCT chest Sept 2017  Followup After  HRCT chest sept 2017 with Dr Chase Caller    > 50% of this > 25 min visit spent in face to face counseling or coordination of care    Dr. Brand Males, M.D., [facility].C.P Pulmonary and  Critical Care Medicine Staff Physician [place] [facility] Pager: [phone], If no answer or between  15:00h - 7:00h: call [phone]  02/24/2016 5:34 PM

## 2016-02-24 NOTE — Patient Instructions (Addendum)
ICD-9-CM ICD-10-CM   1. Dyspnea and respiratory abnormality 786.09 R06.00     R06.89   2. ILD (interstitial lung disease) (HCC) 515 J84.9    Unclear why still so symptomatic despite optimal asthma treatment and improvement in CT chest June 2017  Plan  - discuss with cardiologist at [place] about utility of doing repeat cardiac stress test - continue advair as before - [facility] once okayed by cardiologist; will refer  - do repeat HRCT chest Sept 2017  Followup After  HRCT chest sept 2017 with Dr Marchelle Gearingamaswamy

## 2016-03-10 ENCOUNTER — Telehealth: Payer: Self-pay | Admitting: Internal Medicine

## 2016-03-10 DIAGNOSIS — R0689 Other abnormalities of breathing: Secondary | ICD-10-CM

## 2016-03-10 DIAGNOSIS — R5381 Other malaise: Secondary | ICD-10-CM

## 2016-03-10 DIAGNOSIS — I2510 Atherosclerotic heart disease of native coronary artery without angina pectoris: Secondary | ICD-10-CM

## 2016-03-10 DIAGNOSIS — J849 Interstitial pulmonary disease, unspecified: Secondary | ICD-10-CM

## 2016-03-10 DIAGNOSIS — R0600 Dyspnea, unspecified: Secondary | ICD-10-CM

## 2016-03-10 NOTE — Telephone Encounter (Signed)
Called and spoke with pt and she stated that she did have the stress test done last week and her cardiologist called her and told her that this test was good.  She stated that she needs to get set up with pulmonary rehab---and pt stated that she is wanting to be set up with [facility] for pulmonary rehab and this is in [place].  RB please advise. thanks

## 2016-03-13 ENCOUNTER — Other Ambulatory Visit: Payer: Self-pay | Admitting: Internal Medicine

## 2016-03-13 DIAGNOSIS — J849 Interstitial pulmonary disease, unspecified: Secondary | ICD-10-CM

## 2016-03-13 DIAGNOSIS — R0600 Dyspnea, unspecified: Secondary | ICD-10-CM

## 2016-03-13 DIAGNOSIS — I2510 Atherosclerotic heart disease of native coronary artery without angina pectoris: Secondary | ICD-10-CM

## 2016-03-13 DIAGNOSIS — R5381 Other malaise: Secondary | ICD-10-CM

## 2016-03-13 NOTE — Telephone Encounter (Signed)
Order has been placed for pulm rehab. I have called and lmom to make the pt aware of this and that they will contact her to set this up

## 2016-03-13 NOTE — Telephone Encounter (Signed)
pls refer for indication of dyspnea, physical deconditioning, pulmonary infitlrates and CAD

## 2016-04-16 ENCOUNTER — Ambulatory Visit (INDEPENDENT_AMBULATORY_CARE_PROVIDER_SITE_OTHER)
Admission: RE | Admit: 2016-04-16 | Discharge: 2016-04-16 | Disposition: A | Discharge status: Home / Self Care | Payer: Medicare Other | Source: Ambulatory Visit | Attending: Internal Medicine | Admitting: Internal Medicine

## 2016-04-16 DIAGNOSIS — R0689 Other abnormalities of breathing: Secondary | ICD-10-CM

## 2016-04-16 DIAGNOSIS — R0600 Dyspnea, unspecified: Secondary | ICD-10-CM

## 2016-04-16 DIAGNOSIS — J849 Interstitial pulmonary disease, unspecified: Secondary | ICD-10-CM

## 2016-04-20 ENCOUNTER — Ambulatory Visit (INDEPENDENT_AMBULATORY_CARE_PROVIDER_SITE_OTHER): Payer: Medicare Other | Admitting: Internal Medicine

## 2016-04-20 ENCOUNTER — Encounter: Payer: Self-pay | Admitting: Internal Medicine

## 2016-04-20 VITALS — BP 104/62 | HR 66 | Ht 65.0 in | Wt 144.8 lb

## 2016-04-20 DIAGNOSIS — I2510 Atherosclerotic heart disease of native coronary artery without angina pectoris: Secondary | ICD-10-CM | POA: Diagnosis not present

## 2016-04-20 DIAGNOSIS — J849 Interstitial pulmonary disease, unspecified: Secondary | ICD-10-CM

## 2016-04-20 DIAGNOSIS — R0602 Shortness of breath: Secondary | ICD-10-CM

## 2016-04-20 NOTE — Patient Instructions (Signed)
ICD-9-CM ICD-10-CM   1. ILD (interstitial lung disease) (HCC) 515 J84.9   2. SOB (shortness of breath) 786.05 R06.02    Do spirometry and dlco (no BD, no lung volume) [facility] Refer Dr Dorris FetchHendrickson CV[facility] for surgical lung biopsy evaluation High dose flu shot when feasible - can do it 04/20/2016

## 2016-04-20 NOTE — Progress Notes (Signed)
She willtechnically
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
## 2016-04-27 ENCOUNTER — Encounter: Payer: Self-pay | Admitting: Internal Medicine

## 2016-04-28 ENCOUNTER — Ambulatory Visit (INDEPENDENT_AMBULATORY_CARE_PROVIDER_SITE_OTHER): Payer: Medicare Other

## 2016-04-28 ENCOUNTER — Institutional Professional Consult (permissible substitution) (INDEPENDENT_AMBULATORY_CARE_PROVIDER_SITE_OTHER): Payer: Medicare Other | Admitting: Thoracic Surgery (Cardiothoracic Vascular Surgery)

## 2016-04-28 ENCOUNTER — Encounter: Payer: Self-pay | Admitting: Thoracic Surgery (Cardiothoracic Vascular Surgery)

## 2016-04-28 ENCOUNTER — Other Ambulatory Visit: Payer: Self-pay | Admitting: *Deleted

## 2016-04-28 VITALS — BP 139/62 | HR 69 | Resp 16 | Ht 65.0 in | Wt 144.0 lb

## 2016-04-28 DIAGNOSIS — J849 Interstitial pulmonary disease, unspecified: Secondary | ICD-10-CM

## 2016-04-28 DIAGNOSIS — Z23 Encounter for immunization: Secondary | ICD-10-CM

## 2016-04-28 DIAGNOSIS — Z86711 Personal history of pulmonary embolism: Secondary | ICD-10-CM | POA: Diagnosis not present

## 2016-04-28 DIAGNOSIS — R0602 Shortness of breath: Secondary | ICD-10-CM | POA: Diagnosis not present

## 2016-04-28 DIAGNOSIS — I2510 Atherosclerotic heart disease of native coronary artery without angina pectoris: Secondary | ICD-10-CM | POA: Diagnosis not present

## 2016-04-28 NOTE — Telephone Encounter (Signed)
Called and spoke with pt, she is scheduled to come in at 4:00 for flu shot.  Nothing further needed.

## 2016-04-28 NOTE — Progress Notes (Signed)
PCP is Alinda Deem, MD Referring Provider is Kalman Shan, MD  Chief Complaint  Patient presents with  . Shortness of Breath    on exertion...r/o ILD.Marland KitchenMarland KitchenHI RESOLUTION CT CHEST 04/16/16...eval for lung BX    HPI: Stacy Contreras is referred by Dr. Marchelle Gearing for consideration for lung biopsy.  Stacy Contreras is a 75 year old woman with a past medical history significant for coronary artery disease, DVT, breast cancer, diabetes mellitus without complication, hypertension, arthritis, and "asthma." She has had issues with "asthma" for years. Last fall she was having a lot of pain under her left breast associated with shortness of breath. This would wax and wane over time. In March she felt much worse and went to an urgent care. Chest x-ray was done and she was told she had severe pneumonia. She was treated with antibiotics and prednisone and improved briefly, but her symptoms soon returned.  She saw Dr. Marchelle Gearing in May. She had bronchoscopy which was nondiagnostic. Autoimmune vasculitis workups were negative. She had 2 trials of prednisone each time had some improvement. Her symptoms recurred rapidly each time the prednisone was stopped. Recently her symptoms have been getting worse. She had a CT which showed evidence of worsening interstitial lung disease, and is now referred for biopsy.  She has a history of coronary disease with stents in 2014. She has used nitroglycerin twice in the past year, she is not having any anginal symptoms currently. She has a history of DVT and is on Eliquis for that long term. She had her first DVT back in 80s and then had a more recent one following a left total knee replacement. She is a lifelong nonsmoker and has no significant exposure history.  Zubrod Score: At the time of surgery this patient's most appropriate activity status/level should be described as: []     0    Normal activity, no symptoms [x]     1    Restricted in physical strenuous activity but ambulatory,  able to do out light work []     2    Ambulatory and capable of self care, unable to do work activities, up and about >50 % of waking hours                              []     3    Only limited self care, in bed greater than 50% of waking hours []     4    Completely disabled, no self care, confined to bed or chair []     5    Moribund    Past Medical History:  Diagnosis Date  . Coronary artery disease   . Diabetes mellitus without complication (HCC)   . DVT (deep venous thrombosis) (HCC)   . History of breast cancer   . Hypertension     Past Surgical History:  Procedure Laterality Date  . APPENDECTOMY    . CHOLECYSTECTOMY    . hysterectomy    . left knee replacement    . right breast lumpectomy    . stents x 2    . VIDEO BRONCHOSCOPY Bilateral 12/19/2015   Procedure: VIDEO BRONCHOSCOPY WITHOUT FLUORO;  Surgeon: Kalman Shan, MD;  Location: [facility];  Service: Endoscopy;  Laterality: Bilateral;    Family History  Problem Relation Age of Onset  . Lung disease Mother     ? brown lung  . Colon cancer Father   . Breast cancer Sister   . Breast  cancer Daughter     Social History Social History  Substance Use Topics  . Smoking status: Never Smoker  . Smokeless tobacco: Never Used  . Alcohol use No    Current Outpatient Prescriptions  Medication Sig Dispense Refill  . albuterol (PROVENTIL HFA;VENTOLIN HFA) 108 (90 Base) MCG/ACT inhaler Inhale 2 puffs into the lungs every 4 (four) hours as needed for wheezing or shortness of breath.    Marland Kitchen amLODipine (NORVASC) 5 MG tablet Take 5 mg by mouth daily.    Marland Kitchen apixaban (ELIQUIS) 5 MG TABS tablet Take 0.5 tablets (2.5 mg total) by mouth 2 (two) times daily. Hold on Eliquis the day prior to Bronchoscopy 60 tablet   . aspirin EC 81 MG tablet Take 81 mg by mouth daily.    Marland Kitchen atenolol (TENORMIN) 25 MG tablet Take 25 mg by mouth 2 (two) times daily.    . Black Cohosh 40 MG CAPS Take 120 mg by mouth 2 (two) times daily.    Marland Kitchen  dexlansoprazole (DEXILANT) 60 MG capsule Take 60 mg by mouth daily.    Marland Kitchen docusate sodium (COLACE) 100 MG capsule Take 200 mg by mouth daily as needed for mild constipation.    . Fluticasone-Salmeterol (ADVAIR) 250-50 MCG/DOSE AEPB Inhale 1 puff into the lungs 2 (two) times daily.    Marland Kitchen gabapentin (NEURONTIN) 300 MG capsule Take 600 mg by mouth every evening.    . isosorbide mononitrate (IMDUR) 30 MG 24 hr tablet Take 30 mg by mouth daily.    Marland Kitchen losartan (COZAAR) 100 MG tablet Take 100 mg by mouth at bedtime.    Marland Kitchen lubiprostone (AMITIZA) 24 MCG capsule Take 24 mcg by mouth 2 (two) times daily with a meal.    . metFORMIN (GLUCOPHAGE) 500 MG tablet Take 1,000 mg by mouth 2 (two) times daily with a meal.    . PARoxetine (PAXIL) 20 MG tablet Take 10 mg by mouth daily.    . sitaGLIPtin (JANUVIA) 100 MG tablet Take 100 mg by mouth every evening.    . zolpidem (AMBIEN) 10 MG tablet Take 10 mg by mouth at bedtime as needed for sleep.    Marland Kitchen HYDROcodone-homatropine (HYCODAN) 5-1.5 MG/5ML syrup Take 5 mLs by mouth every 6 (six) hours as needed for cough. (Patient not taking: Reported on 04/28/2016) 120 mL 0   No current facility-administered medications for this visit.     Allergies  Allergen Reactions  . Codeine Other (See Comments)    Fast heart beat  . Statins     Muscle weakness  . Sulfa Antibiotics Rash    Review of Systems  Constitutional: Positive for activity change and fatigue. Negative for chills, fever and unexpected weight change.  HENT: Positive for hearing loss and voice change. Negative for trouble swallowing.   Respiratory: Positive for cough, shortness of breath and wheezing.   Cardiovascular: Negative for chest pain and leg swelling.  Gastrointestinal: Positive for abdominal pain. Negative for blood in stool.  Genitourinary: Negative for difficulty urinating and dysuria.  Musculoskeletal: Positive for arthralgias and joint swelling.  Neurological: Negative for seizures, syncope and  weakness.  Hematological: Negative for adenopathy. Bruises/bleeds easily.  All other systems reviewed and are negative.   BP 139/62   Pulse 69   Resp 16   Ht 5\' 5"  (1.651 m)   Wt 144 lb (65.3 kg)   SpO2 97% Comment: ON RA  BMI 23.96 kg/m  Physical Exam  Constitutional: She is oriented to person, place, and time. She appears  well-developed and well-nourished. No distress.  HENT:  Head: Normocephalic and atraumatic.  Mouth/Throat: No oropharyngeal exudate.  Eyes: Conjunctivae and EOM are normal. No scleral icterus.  Neck: Neck supple. No thyromegaly present.  Cardiovascular: Normal rate, regular rhythm and normal heart sounds.  Exam reveals no gallop and no friction rub.   No murmur heard. Pulmonary/Chest: Effort normal and breath sounds normal. No respiratory distress. She has no wheezes. She has no rales.  Abdominal: Soft. She exhibits no distension. There is no tenderness.  Musculoskeletal: She exhibits no edema.  Lymphadenopathy:    She has no cervical adenopathy.  Neurological: She is oriented to person, place, and time. No cranial nerve deficit. She exhibits normal muscle tone.  Skin: Skin is warm and dry.  Vitals reviewed.    Diagnostic Tests: CT CHEST WITHOUT CONTRAST  TECHNIQUE: Multidetector CT imaging of the chest was performed following the standard protocol without intravenous contrast. High resolution imaging of the lungs, as well as inspiratory and expiratory imaging, was performed.  COMPARISON:  High-resolution chest CT 01/20/2016.  FINDINGS: Cardiovascular: Heart size is normal. There is no significant pericardial fluid, thickening or pericardial calcification. There is aortic atherosclerosis, as well as atherosclerosis of the great vessels of the mediastinum and the coronary arteries, including calcified atherosclerotic plaque in the left main, left anterior descending, left circumflex and right coronary arteries.  Mediastinum/Nodes: No  pathologically enlarged mediastinal or hilar lymph nodes. Multiple densely calcified mediastinal and left hilar lymph nodes are noted. Esophagus is unremarkable in appearance. No axillary lymphadenopathy.  Lungs/Pleura: High-resolution images again demonstrate patchy areas of ground-glass attenuation most evident throughout the mid to lower lungs bilaterally. Many of these areas are associated with some septal thickening and architectural distortion. The areas of greatest involvement also demonstrates some mild thickening of the peribronchovascular interstitium. There is some mild subpleural reticulation, most evident in the inferior aspect of the right middle lobe. No frank honeycombing is identified. Mild peripheral bronchiolectasis is noted. No frank traction bronchiectasis. While the overall appearance is very similar to the prior examination, there are several new areas of very mild ground-glass attenuation compared to the prior examination, most evident throughout the mid to upper lungs. Inspiratory and expiratory imaging demonstrates moderate air trapping, indicative of small airways disease. No acute consolidative airspace disease. No pleural effusions. 2 small pulmonary nodules are noted in the left lung, which appear unchanged compared to the prior examination from 01/20/2016, with the largest nodule measuring 6 x 3 mm (mean diameter of 4.5 mm) in the medial aspect of the right upper lobe (image 38 of series 5). No other more suspicious appearing pulmonary nodules or masses are noted.  Upper Abdomen: Status post cholecystectomy.  Aortic atherosclerosis.  Musculoskeletal: There are no aggressive appearing lytic or blastic lesions noted in the visualized portions of the skeleton.  IMPRESSION: 1. The appearance of the lungs is compatible with interstitial lung disease. While the overall appearance favors nonspecific interstitial pneumonia (NSIP), there are several new  patchy areas of ground-glass attenuation noted throughout the lungs bilaterally which would be somewhat unusual for an entity such as NSIP. Repeat high-resolution chest CT is recommended in 6-12 months to further assess temporal changes in the appearance of the lung parenchyma. 2. Small pulmonary nodules in the right lung, as above, stable compared to prior examinations. Attention at time of repeat high-resolution chest CT is recommended to ensure continued stability. 3. Aortic atherosclerosis, in addition to left main and 3 vessel coronary artery disease. Please note that although  the presence of coronary artery calcium documents the presence of coronary artery disease, the severity of this disease and any potential stenosis cannot be assessed on this non-gated CT examination. Assessment for potential risk factor modification, dietary therapy or pharmacologic therapy may be warranted, if clinically indicated. 4. Moderate air trapping indicative of small airways disease.   Electronically Signed   By: Trudie Reedaniel  Entrikin M.D.   On: 04/16/2016 15:17  I personally reviewed the CT chest and concur with the findings noted above  Impression: 75 year old woman with interstitial lung disease he has had worsening symptoms recently. Dr. Marchelle Gearingamaswamy needs a more definitive diagnosis in order to guide treatment and provide prognostic information. She has had bronchoscopy which was nondiagnostic. He has now referred her for a lung biopsy.  I had a long discussion with Mrs. Fadeley and her husband regarding VATS lung biopsy. Her disease is fairly symmetric so I would favor the right side. I informed them that this is an actual surgical procedure. They understand that it would be done in the operating room under general anesthesia, the incisions be used, the use of a drainage tube postoperatively, the expected hospital stay, and the overall recovery. She understands no guarantee of a definitive diagnosis  can be given. I informed them of the indications, risks, benefits, and alternatives. She understands the risks include, but are not limited to death, MI, DVT, PE, bleeding, possible need for transfusion, infection, prolonged air leak, cardiac arrhythmias, as well as the possibility of other unforeseeable complications.  I spoke to them briefly about the BRAVE study involving bronchoscopy with biopsy and bronchoalveolar lavage. She is interested in that study, I will pass that along to Dr. Marchelle Gearingamaswamy.  Plan: Right VATS, lung biopsy, possible bronchoscopy on Friday, 05/08/2016  Loreli SlotSteven C Pleasant Britz, MD [facility] [phone]

## 2016-04-28 NOTE — Patient Instructions (Signed)
Stop Eliquis 2 days prior to procedure

## 2016-04-29 ENCOUNTER — Ambulatory Visit (HOSPITAL_COMMUNITY)
Admission: RE | Admit: 2016-04-29 | Discharge: 2016-04-29 | Disposition: A | Discharge status: Home / Self Care | Payer: Medicare Other | Source: Ambulatory Visit | Attending: Internal Medicine | Admitting: Internal Medicine

## 2016-04-29 DIAGNOSIS — J849 Interstitial pulmonary disease, unspecified: Secondary | ICD-10-CM | POA: Insufficient documentation

## 2016-04-29 DIAGNOSIS — Z23 Encounter for immunization: Secondary | ICD-10-CM

## 2016-04-29 DIAGNOSIS — J988 Other specified respiratory disorders: Secondary | ICD-10-CM | POA: Insufficient documentation

## 2016-04-29 LAB — PULMONARY FUNCTION TEST
DL/VA % pred: 67 %
DL/VA: 3.3 ml/min/mmHg/L
DLCO unc % pred: 46 %
DLCO unc: 11.93 ml/min/mmHg
FEF 25-75 Pre: 1.48 L/sec
FEF2575-%Pred-Pre: 82 %
FEV1-%Pred-Pre: 72 %
FEV1-Pre: 1.62 L
FEV1FVC-%Pred-Pre: 105 %
FEV6-%Pred-Pre: 70 %
FEV6-Pre: 2.01 L
FEV6FVC-%Pred-Pre: 102 %
FVC-%Pred-Pre: 68 %
FVC-Pre: 2.05 L
Pre FEV1/FVC ratio: 79 %
Pre FEV6/FVC Ratio: 98 %

## 2016-04-30 NOTE — Progress Notes (Signed)
Called and spoke to pt. Informed her of the results per MR. Pt verbalized understanding and denied any further questions or concerns at this time.  

## 2016-05-06 ENCOUNTER — Encounter (HOSPITAL_COMMUNITY)
Admission: RE | Admit: 2016-05-06 | Discharge: 2016-05-06 | Disposition: A | Discharge status: Home / Self Care | Payer: Medicare Other | Source: Ambulatory Visit | Attending: Thoracic Surgery (Cardiothoracic Vascular Surgery) | Admitting: Thoracic Surgery (Cardiothoracic Vascular Surgery)

## 2016-05-06 ENCOUNTER — Ambulatory Visit (HOSPITAL_COMMUNITY)
Admission: RE | Admit: 2016-05-06 | Discharge: 2016-05-06 | Disposition: A | Discharge status: Home / Self Care | Payer: Medicare Other | Source: Ambulatory Visit | Attending: Thoracic Surgery (Cardiothoracic Vascular Surgery) | Admitting: Thoracic Surgery (Cardiothoracic Vascular Surgery)

## 2016-05-06 ENCOUNTER — Other Ambulatory Visit: Payer: Self-pay

## 2016-05-06 ENCOUNTER — Encounter (HOSPITAL_COMMUNITY): Payer: Self-pay

## 2016-05-06 DIAGNOSIS — Z01812 Encounter for preprocedural laboratory examination: Secondary | ICD-10-CM

## 2016-05-06 DIAGNOSIS — J849 Interstitial pulmonary disease, unspecified: Secondary | ICD-10-CM | POA: Insufficient documentation

## 2016-05-06 DIAGNOSIS — Z01810 Encounter for preprocedural cardiovascular examination: Secondary | ICD-10-CM

## 2016-05-06 DIAGNOSIS — Z006 Encounter for examination for normal comparison and control in clinical research program: Secondary | ICD-10-CM

## 2016-05-06 HISTORY — DX: Unspecified asthma, uncomplicated: J45.909

## 2016-05-06 HISTORY — DX: Reserved for inherently not codable concepts without codable children: IMO0001

## 2016-05-06 HISTORY — DX: Calculus of kidney: N20.0

## 2016-05-06 HISTORY — DX: Interstitial pulmonary disease, unspecified: J84.9

## 2016-05-06 HISTORY — DX: Unspecified osteoarthritis, unspecified site: M19.90

## 2016-05-06 HISTORY — DX: Gastro-esophageal reflux disease without esophagitis: K21.9

## 2016-05-06 LAB — CBC
HCT: 38.5 % (ref 36.0–46.0)
Hemoglobin: 12.8 g/dL (ref 12.0–15.0)
MCH: 31 pg (ref 26.0–34.0)
MCHC: 33.2 g/dL (ref 30.0–36.0)
MCV: 93.2 fL (ref 78.0–100.0)
Platelets: 258 10*3/uL (ref 150–400)
RBC: 4.13 MIL/uL (ref 3.87–5.11)
RDW: 13.5 % (ref 11.5–15.5)
WBC: 6.3 10*3/uL (ref 4.0–10.5)

## 2016-05-06 LAB — COMPREHENSIVE METABOLIC PANEL
ALT: 13 U/L — ABNORMAL LOW (ref 14–54)
AST: 15 U/L (ref 15–41)
Albumin: 3.8 g/dL (ref 3.5–5.0)
Alkaline Phosphatase: 35 U/L — ABNORMAL LOW (ref 38–126)
Anion gap: 11 (ref 5–15)
BUN: 10 mg/dL (ref 6–20)
CO2: 21 mmol/L — ABNORMAL LOW (ref 22–32)
Calcium: 9.2 mg/dL (ref 8.9–10.3)
Chloride: 105 mmol/L (ref 101–111)
Creatinine, Ser: 0.74 mg/dL (ref 0.44–1.00)
GFR calc Af Amer: >60 mL/min (ref >60)
GFR calc non Af Amer: >60 mL/min (ref >60)
Glucose, Bld: 113 mg/dL — ABNORMAL HIGH (ref 65–99)
Potassium: 4.3 mmol/L (ref 3.5–5.1)
Sodium: 137 mmol/L (ref 135–145)
Total Bilirubin: 0.6 mg/dL (ref 0.3–1.2)
Total Protein: 6.2 g/dL — ABNORMAL LOW (ref 6.5–8.1)

## 2016-05-06 LAB — URINALYSIS, ROUTINE W REFLEX MICROSCOPIC
Bilirubin Urine: NEGATIVE
Glucose, UA: NEGATIVE mg/dL
Hgb urine dipstick: NEGATIVE
Ketones, ur: NEGATIVE mg/dL
Leukocytes, UA: NEGATIVE
Nitrite: NEGATIVE
Protein, ur: NEGATIVE mg/dL
Specific Gravity, Urine: 1.017 (ref 1.005–1.030)
pH: 5.5 (ref 5.0–8.0)

## 2016-05-06 LAB — BLOOD GAS, ARTERIAL
Acid-base deficit: 0.3 mmol/L (ref 0.0–2.0)
Bicarbonate: 23.6 mmol/L (ref 20.0–28.0)
Drawn by: 449841
FIO2: 21
O2 Saturation: 94 %
Patient temperature: 98.6
pCO2 arterial: 37.6 mmHg (ref 32.0–48.0)
pH, Arterial: 7.415 (ref 7.350–7.450)
pO2, Arterial: 82.5 mmHg — ABNORMAL LOW (ref 83.0–108.0)

## 2016-05-06 LAB — SURGICAL PCR SCREEN
MRSA, PCR: NEGATIVE
Staphylococcus aureus: NEGATIVE

## 2016-05-06 LAB — GLUCOSE, CAPILLARY: Glucose-Capillary: 131 mg/dL — ABNORMAL HIGH (ref 65–99)

## 2016-05-06 LAB — TYPE AND SCREEN
ABO/RH(D): A POS
Antibody Screen: NEGATIVE

## 2016-05-06 LAB — ABO/RH: ABO/RH(D): A POS

## 2016-05-06 NOTE — Pre-Procedure Instructions (Signed)
Hinda Lenisnnie L Lesch  05/06/2016      [facility] [place] - [id number] [place] [address] [place] [id number] Phone: [phone] Fax: [phone]  [facility] [phone][id number] [place]avio Manns- [place], [place] - [address] [place] [id number] Phone: [phone] Fax: [phone]    Your procedure is scheduled on Friday September 29  [facility]  Call this number if you have problems the morning of surgery:  [phone]   Remember:  Do not eat food or drink liquids after midnight.   Take these medicines the morning of surgery with A SIP OF WATER acetaminophen (TYLENOL), albuterol, amLODipine (NORVASC), atenolol (TENORMIN), cycloSPORINE (RESTASIS),Fluticasone-Salmeterol (ADVAIR), gabapentin (NEURONTIN), isosorbide mononitrate (IMDUR), PARoxetine (PAXIL),   7 days prior to surgery STOP taking any Aspirin, Aleve, Naproxen, Ibuprofen, Motrin, Advil, Goody's, BC's, all herbal medications, fish oil, and all vitamins   How to Manage Your Diabetes Before and After Surgery  Why is it important to control my blood sugar before and after surgery? . Improving blood sugar levels before and after surgery helps healing and can limit problems. . A way of improving blood sugar control is eating a healthy diet by: o  Eating less sugar and carbohydrates o  Increasing activity/exercise o  Talking with your doctor about reaching your blood sugar goals . High blood sugars (greater than 180 mg/dL) can raise your risk of infections and slow your recovery, so you will need to focus on controlling your diabetes during the weeks before surgery. . Make sure that the doctor who takes care of your diabetes knows about your planned surgery including the date and location.  How do I manage my blood sugar before surgery? . Check your blood sugar at least 4 times a day, starting 2 days before surgery, to make sure that the level  is not too high or low. o Check your blood sugar the morning of your surgery when you wake up and every 2 hours until you get to the Short Stay unit. . If your blood sugar is less than 70 mg/dL, you will need to treat for low blood sugar: o Do not take insulin. o Treat a low blood sugar (less than 70 mg/dL) with  cup of clear juice (cranberry or apple), 4 glucose tablets, OR glucose gel. o Recheck blood sugar in 15 minutes after treatment (to make sure it is greater than 70 mg/dL). If your blood sugar is not greater than 70 mg/dL on recheck, call [phone] for further instructions. . Report your blood sugar to the short stay nurse when you get to Short Stay.  . If you are admitted to the hospital after surgery: o Your blood sugar will be checked by the staff and you will probably be given insulin after surgery (instead of oral diabetes medicines) to make sure you have good blood sugar levels. o The goal for blood sugar control after surgery is 80-180 mg/dL.      WHAT DO I DO ABOUT MY DIABETES MEDICATION?   Marland Kitchen. Do not take oral diabetes medicines (pills) the morning of surgery. metFORMIN (GLUCOPHAGE) or sitaGLIPtin (JANUVIA)  . The day of surgery, do not take other diabetes injectables, including Byetta (exenatide), Bydureon (exenatide ER), Victoza (liraglutide), or Trulicity (dulaglutide).      Do not wear jewelry, make-up or nail polish.  Do not wear lotions, powders, or perfumes, or deoderant.  Do not shave 48  hours prior to surgery.  Men may shave face and neck.  Do not bring valuables to the hospital.  [facility] is not responsible for any belongings or valuables.  Contacts, dentures or bridgework may not be worn into surgery.  Leave your suitcase in the car.  After surgery it may be brought to your room.  For patients admitted to the hospital, discharge time will be determined by your treatment team.  Patients discharged the day of surgery will not be allowed to drive home.     Special instructions:   [place]- Preparing For Surgery  Before surgery, you can play an important role. Because skin is not sterile, your skin needs to be as free of germs as possible. You can reduce the number of germs on your skin by washing with CHG (chlorahexidine gluconate) Soap before surgery.  CHG is an antiseptic cleaner which kills germs and bonds with the skin to continue killing germs even after washing.  Please do not use if you have an allergy to CHG or antibacterial soaps. If your skin becomes reddened/irritated stop using the CHG.  Do not shave (including legs and underarms) for at least 48 hours prior to first CHG shower. It is OK to shave your face.  Please follow these instructions carefully.   1. Shower the NIGHT BEFORE SURGERY and the MORNING OF SURGERY with CHG.   2. If you chose to wash your hair, wash your hair first as usual with your normal shampoo.  3. After you shampoo, rinse your hair and body thoroughly to remove the shampoo.  4. Use CHG as you would any other liquid soap. You can apply CHG directly to the skin and wash gently with a scrungie or a clean washcloth.   5. Apply the CHG Soap to your body ONLY FROM THE NECK DOWN.  Do not use on open wounds or open sores. Avoid contact with your eyes, ears, mouth and genitals (private parts). Wash genitals (private parts) with your normal soap.  6. Wash thoroughly, paying special attention to the area where your surgery will be performed.  7. Thoroughly rinse your body with warm water from the neck down.  8. DO NOT shower/wash with your normal soap after using and rinsing off the CHG Soap.  9. Pat yourself dry with a CLEAN TOWEL.   10. Wear CLEAN PAJAMAS   11. Place CLEAN SHEETS on your bed the night of your first shower and DO NOT SLEEP WITH PETS.    Day of Surgery: Do not apply any deodorants/lotions. Please wear clean clothes to the hospital/surgery center.      Please read over the following  fact sheets that you were given. Pain Booklet, Coughing and Deep Breathing, Blood Transfusion Information, MRSA Information and Surgical Site Infection Prevention

## 2016-05-06 NOTE — Progress Notes (Signed)
PCP - Alinda DeemStephen Jannach Cardiologist - Altamease OilerKevin Lingle - needs cardiac clearance  Chest x-ray - 05/06/16 EKG - 05/06/16 Stress Test - requesting from [facility] ECHO - requesting from [facility] Cardiac Cath -requesting from [facility]  Patient stopped elquilis on 05/06/16  Instructed to continue aspirin until day of surgery   Patient denies shortness of breath, fever, cough and chest pain at PAT appointment

## 2016-05-06 NOTE — Progress Notes (Signed)
BRAVE-1: Bronchial Sample Collection for a Novel Genomic Test   Brave-1 Informed Consent   Subject Name: Stacy Contreras  This patient, Stacy Lenisnnie L Bangs, has been consented to the above clinical trial according to FDA regulations, GCP guidelines and [facility]'s SOPs. The informed consent form and study design have been explained to this patient by this study coordinator at 14:00 on 05/06/2016. Informed consent form was also mailed to Mrs. Gessel for review last week by Kinnie FeilStacey Phelps, RN. The patient demonstrated comprehension of this clinical trial and study requirements/expectations. No study procedures have been initiated before consenting of this patient. The patient was given sufficient time for reading the consent form. All risks, benefits and options have been thoroughly discussed and all questions were answered per the patient's satisfaction. This patient was not coerced in any way to participate in this clinical trial. This patient has voluntarily signed consent local version 1.2 at 14:30 on 05/06/2016. A copy of the signed consent form was given to the patient and a copy was placed in the subject's medical record. It should be noted that patient goes by the name "[name]" and legally signs her name as "[name]". Her full name however, as shown in [facility], is [name]. For purpose of this consent form and ease of identification, subject has signed both names on the informed consent form. Subject was thanked for their participation in research and contribution to science.   Toula MoosFrances J Sundra Haddix, RN Clinical Research Nurse  [facility] Office: [phone]

## 2016-05-06 NOTE — Progress Notes (Signed)
Patient has only stopped Elquilis for one day.  PTT and PT/INR needs to be drawn DOS

## 2016-05-07 LAB — HEMOGLOBIN A1C
Hgb A1c MFr Bld: 5.7 % — ABNORMAL HIGH (ref 4.8–5.6)
Mean Plasma Glucose: 117 mg/dL

## 2016-05-07 NOTE — Progress Notes (Signed)
Anesthesia chart review: Patient is a 75 year old female scheduled for right VATS, lung biopsy on 05/08/2016 by Dr. Dorris FetchHendrickson.  History includes interstitial lung disease, non-smoker, DM2, CAD s/p LAD stents X 2 '14, HTN, DVT ('80s' and post TKA; on Eliquis), asthma, SOB, nephrolithiasis, GERD, arthritis, hysterectomy, appendectomy, breast cancer s/p right breast lumpectomy, left TKA, cholecystectomy.  PCP is Dr. Alinda DeemStephen Jannach. Cardiologist is Dr. Altamease OilerKevin Lingle, last visit 02/27/16. Pulmonologist is Dr. Kalman ShanMurali Ramaswamy. Seen for ILD. By 04/28/16 [facility] notes, previous bronchoscopy was non-diagnostic, autoimmune vasculitis work-up was negative.   Meds include albuterol, amlodipine, Eliquis (holding after 05/06/16 dose), ASA 81 mg, atenolol, black cohosh, Dexilant, Flonase, Advair, Neurontin, Imdur, losartan, Amitiza, metformin, Nitro, fish oil, oxygen, Paxil, Januvia, Ambien.  BP (!) 123/47   Pulse 64   Temp 36.4 C (Oral)   Resp 18   Wt 146 lb 2 oz (66.3 kg)   SpO2 95%   BMI 24.32 kg/m   05/06/2016 EKG: Normal sinus rhythm, nonspecific T wave abnormality.  03/31/16 Echo [facility]): Study Conclusions: 1. Left ventricle: The left ventricle is normal in size with low normal systolic function. Estimated ejection fraction is 50-55%. There is a questionable mild distal hypokinesis of the distal septum. There is no concentric LVH. There appears to be normal diastolic function. 2. Right ventricle: The right ventricle is normal in size and systolic function. The right ventricle is poorly visualized. 3. Left atrium is structurally normal. 4. Right atrium is poorly visualized. 5. Aorta: Normal aortic root. 6. Pericardium: No obvious pericardial effusion. 7. IVC is normal in size and inspiratory collapse. 8. Aortic valve: Suboptimally visualized. No Doppler evidence of significant stenosis or insufficiency. 9. Mitral valve: Poorly visualized. No Doppler evidence of significant stenosis or  regurgitation. 10. Pulmonic valve: Not visualized. No Doppler evidence of significant stenosis or insufficiency. 11. Tricuspid valve: Structurally normal. No Doppler evidence of significant stenosis, trivial regurgitation.  03/06/16 Nuclear stress test [facility]): Impression:  1. Normal Lexiscan myocardial perfusion imaging study with no evidence of ischemia or prior infarction. 2. The calculated ejection fraction is 90%. There is no wall motion abnormalities.  - 12/12/12 LHC [facility]): Normal LMCA. 10-20% in-stent restenosis proximal LAD, 50% mid LAD, 70% DIAG (small), luminal irregularities CX, 20% proximal RCA. LVEDP mildly elevated at 18 mmHg. Proceed with FFR of the mid LAD lesion. - 12/12/12 FFR/PCI [facility]): Positive FFR of the mid LAD lesion with the FFR 0.75. Successful stenting of the mid LAD lesion with a 2.25 X 20 PROMUS Premier DES. Recommend DAPT X 1 year.  - 10/20/12 PCI [facility]): 99% proximal LAD, s/p successful PCI with 2.25 X 15 mm BMS. Recommend DAPT X 1 year.  04/29/16 PFTs: FVC 2.05 (68%), FEV1 1.62 (72%), DLCOunc 11.93 (46%).  05/06/16 CXR: IMPRESSION: No active cardiopulmonary disease.  04/16/16 CT Chest High Resolution: IMPRESSION: 1. The appearance of the lungs is compatible with interstitial lung disease. While the overall appearance favors nonspecific interstitial pneumonia (NSIP), there are several new patchy areas of ground-glass attenuation noted throughout the lungs bilaterally which would be somewhat unusual for an entity such as NSIP. Repeat high-resolution chest CT is recommended in 6-12 months to further assess temporal changes in the appearance of the lung parenchyma. 2. Small pulmonary nodules in the right lung, as above, stable compared to prior examinations. Attention at time of repeat high-resolution chest CT is recommended to ensure continued stability. 3. Aortic atherosclerosis, in addition to left main and 3 vessel coronary artery disease. Please note that  although the presence  of coronary artery calcium documents the presence of coronary artery disease, the severity of this disease and any potential stenosis cannot be assessed on this non-gated CT examination. Assessment for potential risk factor modification, dietary therapy or pharmacologic therapy may be warranted, if clinically indicated. 4. Moderate air trapping indicative of small airways disease.  Preoperative labs noted. A1c 5.7.  If no acute changes then I anticipate that she can proceed as planned.  [facility]na [facility] Short Stay Center/Anesthesiology Phone [phone] 05/07/2016 12:49 PM

## 2016-05-08 ENCOUNTER — Inpatient Hospital Stay (HOSPITAL_COMMUNITY): Payer: Medicare Other | Admitting: Vascular Surgery

## 2016-05-08 ENCOUNTER — Encounter (HOSPITAL_COMMUNITY)
Admission: RE | Disposition: A | Discharge status: Home / Self Care | Payer: Self-pay | Source: Ambulatory Visit | Attending: Thoracic Surgery (Cardiothoracic Vascular Surgery)

## 2016-05-08 ENCOUNTER — Inpatient Hospital Stay (HOSPITAL_COMMUNITY)
Admission: RE | Admit: 2016-05-08 | Discharge: 2016-05-11 | DRG: 168 | Disposition: A | Discharge status: Home / Self Care | Payer: Medicare Other | Source: Ambulatory Visit | Attending: Thoracic Surgery (Cardiothoracic Vascular Surgery) | Admitting: Thoracic Surgery (Cardiothoracic Vascular Surgery)

## 2016-05-08 ENCOUNTER — Inpatient Hospital Stay (HOSPITAL_COMMUNITY): Payer: Medicare Other

## 2016-05-08 ENCOUNTER — Inpatient Hospital Stay (HOSPITAL_COMMUNITY): Payer: Medicare Other | Admitting: Certified Registered Nurse Anesthetist

## 2016-05-08 ENCOUNTER — Encounter (HOSPITAL_COMMUNITY): Payer: Self-pay | Admitting: *Deleted

## 2016-05-08 DIAGNOSIS — J849 Interstitial pulmonary disease, unspecified: Secondary | ICD-10-CM | POA: Diagnosis present

## 2016-05-08 DIAGNOSIS — Z7901 Long term (current) use of anticoagulants: Secondary | ICD-10-CM

## 2016-05-08 DIAGNOSIS — Z7951 Long term (current) use of inhaled steroids: Secondary | ICD-10-CM

## 2016-05-08 DIAGNOSIS — Z01810 Encounter for preprocedural cardiovascular examination: Secondary | ICD-10-CM

## 2016-05-08 DIAGNOSIS — I2510 Atherosclerotic heart disease of native coronary artery without angina pectoris: Secondary | ICD-10-CM | POA: Diagnosis present

## 2016-05-08 DIAGNOSIS — Z7982 Long term (current) use of aspirin: Secondary | ICD-10-CM | POA: Diagnosis not present

## 2016-05-08 DIAGNOSIS — I10 Essential (primary) hypertension: Secondary | ICD-10-CM | POA: Diagnosis present

## 2016-05-08 DIAGNOSIS — Z86718 Personal history of other venous thrombosis and embolism: Secondary | ICD-10-CM

## 2016-05-08 DIAGNOSIS — Z7984 Long term (current) use of oral hypoglycemic drugs: Secondary | ICD-10-CM

## 2016-05-08 DIAGNOSIS — Z853 Personal history of malignant neoplasm of breast: Secondary | ICD-10-CM | POA: Diagnosis not present

## 2016-05-08 DIAGNOSIS — Z4682 Encounter for fitting and adjustment of non-vascular catheter: Secondary | ICD-10-CM

## 2016-05-08 DIAGNOSIS — E119 Type 2 diabetes mellitus without complications: Secondary | ICD-10-CM | POA: Diagnosis present

## 2016-05-08 DIAGNOSIS — Z9889 Other specified postprocedural states: Secondary | ICD-10-CM

## 2016-05-08 DIAGNOSIS — Z79899 Other long term (current) drug therapy: Secondary | ICD-10-CM | POA: Diagnosis not present

## 2016-05-08 DIAGNOSIS — R0602 Shortness of breath: Secondary | ICD-10-CM | POA: Diagnosis present

## 2016-05-08 DIAGNOSIS — Z419 Encounter for procedure for purposes other than remedying health state, unspecified: Secondary | ICD-10-CM

## 2016-05-08 DIAGNOSIS — Z96652 Presence of left artificial knee joint: Secondary | ICD-10-CM | POA: Diagnosis present

## 2016-05-08 DIAGNOSIS — Z955 Presence of coronary angioplasty implant and graft: Secondary | ICD-10-CM | POA: Diagnosis not present

## 2016-05-08 DIAGNOSIS — Z01812 Encounter for preprocedural laboratory examination: Secondary | ICD-10-CM | POA: Diagnosis not present

## 2016-05-08 DIAGNOSIS — Z09 Encounter for follow-up examination after completed treatment for conditions other than malignant neoplasm: Secondary | ICD-10-CM

## 2016-05-08 HISTORY — PX: VIDEO ASSISTED THORACOSCOPY: SHX5073

## 2016-05-08 HISTORY — PX: LUNG BIOPSY: SHX5088

## 2016-05-08 HISTORY — PX: VIDEO BRONCHOSCOPY: SHX5072

## 2016-05-08 LAB — PROTIME-INR
INR: 0.99
Prothrombin Time: 13.1 seconds (ref 11.4–15.2)

## 2016-05-08 LAB — GLUCOSE, CAPILLARY
Glucose-Capillary: 125 mg/dL — ABNORMAL HIGH (ref 65–99)
Glucose-Capillary: 97 mg/dL (ref 65–99)
Glucose-Capillary: 107 mg/dL — ABNORMAL HIGH (ref 65–99)
Glucose-Capillary: 122 mg/dL — ABNORMAL HIGH (ref 65–99)

## 2016-05-08 LAB — APTT: aPTT: 28 seconds (ref 24–36)

## 2016-05-08 SURGERY — VIDEO ASSISTED THORACOSCOPY
Anesthesia: General | Site: Chest | Laterality: Right

## 2016-05-08 MED ORDER — GABAPENTIN 300 MG PO CAPS
600.0000 mg | ORAL_CAPSULE | Freq: Every day | ORAL | Status: DC
  Administered 2016-05-09 – 2016-05-10 (×2): 600 mg via ORAL
  Filled 2016-05-08 (×2): qty 2

## 2016-05-08 MED ORDER — LABETALOL HCL 5 MG/ML IV SOLN
INTRAVENOUS | Status: DC | PRN
  Administered 2016-05-08 (×2): 2.5 mg via INTRAVENOUS

## 2016-05-08 MED ORDER — EPINEPHRINE HCL 1 MG/ML IJ SOLN
INTRAMUSCULAR | Status: AC
  Filled 2016-05-08: qty 1

## 2016-05-08 MED ORDER — LIDOCAINE 2% (20 MG/ML) 5 ML SYRINGE
INTRAMUSCULAR | Status: DC | PRN
  Administered 2016-05-08: 40 mg via INTRAVENOUS

## 2016-05-08 MED ORDER — EPHEDRINE SULFATE 50 MG/ML IJ SOLN
INTRAMUSCULAR | Status: DC | PRN
  Administered 2016-05-08 (×2): 5 mg via INTRAVENOUS

## 2016-05-08 MED ORDER — MIDAZOLAM HCL 2 MG/2ML IJ SOLN
INTRAMUSCULAR | Status: AC
  Filled 2016-05-08: qty 2

## 2016-05-08 MED ORDER — ATENOLOL 25 MG PO TABS
25.0000 mg | ORAL_TABLET | Freq: Two times a day (BID) | ORAL | Status: DC
  Administered 2016-05-08 – 2016-05-11 (×6): 25 mg via ORAL
  Filled 2016-05-08 (×6): qty 1

## 2016-05-08 MED ORDER — FENTANYL CITRATE (PF) 100 MCG/2ML IJ SOLN
INTRAMUSCULAR | Status: AC
Start: 2016-05-08 — End: 2016-05-08
  Filled 2016-05-08: qty 2

## 2016-05-08 MED ORDER — PROPOFOL 10 MG/ML IV BOLUS
INTRAVENOUS | Status: DC | PRN
  Administered 2016-05-08: 120 mg via INTRAVENOUS
  Administered 2016-05-08: 30 mg via INTRAVENOUS

## 2016-05-08 MED ORDER — ONDANSETRON HCL 4 MG/2ML IJ SOLN: 4.0000 mg | Freq: Four times a day (QID) | INTRAMUSCULAR | Status: DC | PRN

## 2016-05-08 MED ORDER — DEXTROSE 5 % IV SOLN
1.5000 g | Freq: Two times a day (BID) | INTRAVENOUS | Status: AC
  Administered 2016-05-08 – 2016-05-09 (×2): 1.5 g via INTRAVENOUS
  Filled 2016-05-08 (×2): qty 1.5

## 2016-05-08 MED ORDER — FENTANYL 40 MCG/ML IV SOLN
INTRAVENOUS | Status: AC
  Administered 2016-05-08: 120 ug via INTRAVENOUS
  Administered 2016-05-08: 18:00:00 via INTRAVENOUS
  Administered 2016-05-09: 100 ug via INTRAVENOUS
  Administered 2016-05-09: 130 ug via INTRAVENOUS
  Administered 2016-05-09: 20 ug via INTRAVENOUS
  Administered 2016-05-09: 80 ug via INTRAVENOUS
  Administered 2016-05-09: 123 ug via INTRAVENOUS
  Administered 2016-05-10 (×2): 10 ug via INTRAVENOUS

## 2016-05-08 MED ORDER — BUPIVACAINE HCL 0.5 % IJ SOLN
INTRAMUSCULAR | Status: AC
  Filled 2016-05-08: qty 1

## 2016-05-08 MED ORDER — LIDOCAINE 2% (20 MG/ML) 5 ML SYRINGE
INTRAMUSCULAR | Status: AC
  Filled 2016-05-08: qty 5

## 2016-05-08 MED ORDER — PAROXETINE HCL 10 MG PO TABS
10.0000 mg | ORAL_TABLET | Freq: Two times a day (BID) | ORAL | Status: DC
  Administered 2016-05-08 – 2016-05-11 (×6): 10 mg via ORAL
  Filled 2016-05-08 (×6): qty 1

## 2016-05-08 MED ORDER — OXYCODONE HCL 5 MG/5ML PO SOLN: 5.0000 mg | Freq: Once | ORAL | Status: DC | PRN

## 2016-05-08 MED ORDER — ACETAMINOPHEN 160 MG/5ML PO SOLN
325.0000 mg | ORAL | Status: DC | PRN
  Filled 2016-05-08: qty 20.3

## 2016-05-08 MED ORDER — FENTANYL 40 MCG/ML IV SOLN
INTRAVENOUS | Status: AC
  Filled 2016-05-08: qty 25

## 2016-05-08 MED ORDER — OXYCODONE HCL 5 MG PO TABS: 5.0000 mg | ORAL_TABLET | Freq: Once | ORAL | Status: DC | PRN

## 2016-05-08 MED ORDER — AMLODIPINE BESYLATE 5 MG PO TABS
5.0000 mg | ORAL_TABLET | Freq: Every day | ORAL | Status: DC
  Administered 2016-05-09 – 2016-05-10 (×2): 5 mg via ORAL
  Filled 2016-05-08 (×2): qty 1

## 2016-05-08 MED ORDER — ALBUTEROL SULFATE (2.5 MG/3ML) 0.083% IN NEBU: 2.5000 mg | INHALATION_SOLUTION | RESPIRATORY_TRACT | Status: DC | PRN

## 2016-05-08 MED ORDER — BUPIVACAINE HCL (PF) 0.5 % IJ SOLN
INTRAMUSCULAR | Status: DC | PRN
  Administered 2016-05-08: 10 mL

## 2016-05-08 MED ORDER — 0.9 % SODIUM CHLORIDE (POUR BTL) OPTIME
TOPICAL | Status: DC | PRN
  Administered 2016-05-08: 2000 mL

## 2016-05-08 MED ORDER — ONDANSETRON HCL 4 MG/2ML IJ SOLN
INTRAMUSCULAR | Status: DC | PRN
  Administered 2016-05-08: 4 mg via INTRAVENOUS

## 2016-05-08 MED ORDER — CEFUROXIME SODIUM 1.5 G IJ SOLR
INTRAMUSCULAR | Status: AC
  Filled 2016-05-08: qty 1.5

## 2016-05-08 MED ORDER — ASPIRIN EC 81 MG PO TBEC
81.0000 mg | DELAYED_RELEASE_TABLET | Freq: Every day | ORAL | Status: DC
  Administered 2016-05-08 – 2016-05-11 (×4): 81 mg via ORAL
  Filled 2016-05-08 (×4): qty 1

## 2016-05-08 MED ORDER — BUPIVACAINE HCL (PF) 0.5 % IJ SOLN
INTRAMUSCULAR | Status: AC
  Filled 2016-05-08: qty 10

## 2016-05-08 MED ORDER — ONDANSETRON HCL 4 MG/2ML IJ SOLN
INTRAMUSCULAR | Status: AC
  Filled 2016-05-08: qty 2

## 2016-05-08 MED ORDER — NALOXONE HCL 0.4 MG/ML IJ SOLN: 0.4000 mg | INTRAMUSCULAR | Status: DC | PRN

## 2016-05-08 MED ORDER — MOMETASONE FURO-FORMOTEROL FUM 200-5 MCG/ACT IN AERO
2.0000 | INHALATION_SPRAY | Freq: Two times a day (BID) | RESPIRATORY_TRACT | Status: DC
  Administered 2016-05-09 – 2016-05-10 (×3): 2 via RESPIRATORY_TRACT
  Filled 2016-05-08: qty 8.8

## 2016-05-08 MED ORDER — ACETAMINOPHEN 160 MG/5ML PO SOLN: 1000.0000 mg | Freq: Four times a day (QID) | ORAL | Status: DC

## 2016-05-08 MED ORDER — FLUTICASONE PROPIONATE 50 MCG/ACT NA SUSP
1.0000 | Freq: Every day | NASAL | Status: DC | PRN
  Filled 2016-05-08: qty 16

## 2016-05-08 MED ORDER — DOCUSATE SODIUM 100 MG PO CAPS
200.0000 mg | ORAL_CAPSULE | Freq: Every day | ORAL | Status: DC
  Administered 2016-05-08 – 2016-05-10 (×3): 200 mg via ORAL
  Filled 2016-05-08 (×3): qty 2

## 2016-05-08 MED ORDER — MIDAZOLAM HCL 5 MG/5ML IJ SOLN
INTRAMUSCULAR | Status: DC | PRN
  Administered 2016-05-08: 1 mg via INTRAVENOUS

## 2016-05-08 MED ORDER — LOSARTAN POTASSIUM 50 MG PO TABS
100.0000 mg | ORAL_TABLET | Freq: Every day | ORAL | Status: DC
  Administered 2016-05-09 – 2016-05-10 (×2): 100 mg via ORAL
  Filled 2016-05-08 (×2): qty 2

## 2016-05-08 MED ORDER — SUGAMMADEX SODIUM 200 MG/2ML IV SOLN
INTRAVENOUS | Status: DC | PRN
  Administered 2016-05-08: 132.6 mg via INTRAVENOUS

## 2016-05-08 MED ORDER — METFORMIN HCL 500 MG PO TABS
1000.0000 mg | ORAL_TABLET | Freq: Two times a day (BID) | ORAL | Status: DC
  Administered 2016-05-09 – 2016-05-11 (×5): 1000 mg via ORAL
  Filled 2016-05-08 (×5): qty 2

## 2016-05-08 MED ORDER — ROCURONIUM BROMIDE 10 MG/ML (PF) SYRINGE
PREFILLED_SYRINGE | INTRAVENOUS | Status: DC | PRN
  Administered 2016-05-08: 60 mg via INTRAVENOUS
  Administered 2016-05-08 (×2): 10 mg via INTRAVENOUS

## 2016-05-08 MED ORDER — TRAMADOL HCL 50 MG PO TABS
50.0000 mg | ORAL_TABLET | Freq: Four times a day (QID) | ORAL | Status: DC | PRN
  Administered 2016-05-10: 50 mg via ORAL
  Filled 2016-05-08: qty 1

## 2016-05-08 MED ORDER — POTASSIUM CHLORIDE 10 MEQ/50ML IV SOLN: 10.0000 meq | Freq: Every day | INTRAVENOUS | Status: DC | PRN

## 2016-05-08 MED ORDER — DEXTROSE 5 % IV SOLN
1.5000 g | INTRAVENOUS | Status: AC
  Administered 2016-05-08: 1.5 g via INTRAVENOUS

## 2016-05-08 MED ORDER — LACTATED RINGERS IV SOLN
INTRAVENOUS | Status: DC | PRN
  Administered 2016-05-08: 11:00:00 via INTRAVENOUS

## 2016-05-08 MED ORDER — LABETALOL HCL 5 MG/ML IV SOLN
INTRAVENOUS | Status: AC
  Filled 2016-05-08: qty 4

## 2016-05-08 MED ORDER — FENTANYL CITRATE (PF) 100 MCG/2ML IJ SOLN
INTRAMUSCULAR | Status: DC | PRN
  Administered 2016-05-08 (×2): 50 ug via INTRAVENOUS
  Administered 2016-05-08: 100 ug via INTRAVENOUS
  Administered 2016-05-08: 50 ug via INTRAVENOUS

## 2016-05-08 MED ORDER — LUBIPROSTONE 24 MCG PO CAPS
24.0000 ug | ORAL_CAPSULE | Freq: Two times a day (BID) | ORAL | Status: DC
  Administered 2016-05-09 – 2016-05-11 (×5): 24 ug via ORAL
  Filled 2016-05-08 (×5): qty 1

## 2016-05-08 MED ORDER — FENTANYL CITRATE (PF) 100 MCG/2ML IJ SOLN
INTRAMUSCULAR | Status: AC
  Filled 2016-05-08: qty 4

## 2016-05-08 MED ORDER — LUNG SURGERY BOOK
Freq: Once | Status: AC
  Administered 2016-05-08
  Filled 2016-05-08 (×2): qty 1

## 2016-05-08 MED ORDER — BISACODYL 5 MG PO TBEC
10.0000 mg | DELAYED_RELEASE_TABLET | Freq: Every day | ORAL | Status: DC
  Administered 2016-05-08 – 2016-05-10 (×3): 10 mg via ORAL
  Filled 2016-05-08 (×4): qty 2

## 2016-05-08 MED ORDER — SODIUM CHLORIDE 0.9% FLUSH: 9.0000 mL | INTRAVENOUS | Status: DC | PRN

## 2016-05-08 MED ORDER — HYDROMORPHONE HCL 1 MG/ML IJ SOLN: 0.2500 mg | INTRAMUSCULAR | Status: DC | PRN

## 2016-05-08 MED ORDER — SENNOSIDES-DOCUSATE SODIUM 8.6-50 MG PO TABS
1.0000 | ORAL_TABLET | Freq: Every day | ORAL | Status: DC
Start: 2016-05-08 — End: 2016-05-11
  Administered 2016-05-09: 1 via ORAL
  Filled 2016-05-08 (×2): qty 1

## 2016-05-08 MED ORDER — BUPIVACAINE 0.5 % ON-Q PUMP SINGLE CATH 400 ML
400.0000 mL | INJECTION | Status: DC
  Filled 2016-05-08: qty 400

## 2016-05-08 MED ORDER — ACETAMINOPHEN 325 MG PO TABS: 325.0000 mg | ORAL_TABLET | ORAL | Status: DC | PRN

## 2016-05-08 MED ORDER — DIPHENHYDRAMINE HCL 12.5 MG/5ML PO ELIX: 12.5000 mg | ORAL_SOLUTION | Freq: Four times a day (QID) | ORAL | Status: DC | PRN

## 2016-05-08 MED ORDER — PANTOPRAZOLE SODIUM 40 MG PO TBEC
40.0000 mg | DELAYED_RELEASE_TABLET | Freq: Every day | ORAL | Status: DC
  Administered 2016-05-08 – 2016-05-11 (×4): 40 mg via ORAL
  Filled 2016-05-08 (×4): qty 1

## 2016-05-08 MED ORDER — PROPOFOL 10 MG/ML IV BOLUS
INTRAVENOUS | Status: AC
  Filled 2016-05-08: qty 20

## 2016-05-08 MED ORDER — LACTATED RINGERS IV SOLN
INTRAVENOUS | Status: DC | PRN
  Administered 2016-05-08 (×2): via INTRAVENOUS

## 2016-05-08 MED ORDER — ORAL CARE MOUTH RINSE
15.0000 mL | Freq: Two times a day (BID) | OROMUCOSAL | Status: DC
  Administered 2016-05-09 – 2016-05-11 (×3): 15 mL via OROMUCOSAL

## 2016-05-08 MED ORDER — INSULIN ASPART 100 UNIT/ML ~~LOC~~ SOLN
0.0000 [IU] | Freq: Four times a day (QID) | SUBCUTANEOUS | Status: DC
  Administered 2016-05-08: 2 [IU] via SUBCUTANEOUS

## 2016-05-08 MED ORDER — ISOSORBIDE MONONITRATE ER 30 MG PO TB24
30.0000 mg | ORAL_TABLET | Freq: Every day | ORAL | Status: DC
  Administered 2016-05-09 – 2016-05-10 (×2): 30 mg via ORAL
  Filled 2016-05-08 (×2): qty 1

## 2016-05-08 MED ORDER — CYCLOSPORINE 0.05 % OP EMUL
1.0000 [drp] | Freq: Two times a day (BID) | OPHTHALMIC | Status: DC
  Administered 2016-05-08 – 2016-05-11 (×6): 1 [drp] via OPHTHALMIC
  Filled 2016-05-08 (×5): qty 1

## 2016-05-08 MED ORDER — ACETAMINOPHEN 500 MG PO TABS
1000.0000 mg | ORAL_TABLET | Freq: Four times a day (QID) | ORAL | Status: DC
  Administered 2016-05-08 – 2016-05-11 (×10): 1000 mg via ORAL
  Filled 2016-05-08 (×11): qty 2

## 2016-05-08 MED ORDER — LACTATED RINGERS IV SOLN: INTRAVENOUS | Status: DC

## 2016-05-08 MED ORDER — DIPHENHYDRAMINE HCL 50 MG/ML IJ SOLN: 12.5000 mg | Freq: Four times a day (QID) | INTRAMUSCULAR | Status: DC | PRN

## 2016-05-08 SURGICAL SUPPLY — 79 items
APPLIER CLIP ROT 10 11.4 M/L (STAPLE)
CANISTER SUCTION 2500CC (MISCELLANEOUS) ×4 IMPLANT
CATH KIT ON Q 5IN SLV (PAIN MANAGEMENT) IMPLANT
CATH KIT ON-Q SILVERSOAK 5IN (CATHETERS) ×4 IMPLANT
CATH THORACIC 28FR (CATHETERS) IMPLANT
CATH THORACIC 28FR RT ANG (CATHETERS) IMPLANT
CATH THORACIC 36FR (CATHETERS) IMPLANT
CATH THORACIC 36FR RT ANG (CATHETERS) IMPLANT
CLIP APPLIE ROT 10 11.4 M/L (STAPLE) IMPLANT
CLIP TI MEDIUM 6 (CLIP) IMPLANT
CONN Y 3/8X3/8X3/8  BEN (MISCELLANEOUS) ×1
CONN Y 3/8X3/8X3/8 BEN (MISCELLANEOUS) ×3 IMPLANT
CONT SPEC 4OZ CLIKSEAL STRL BL (MISCELLANEOUS) ×24 IMPLANT
COVER OVERHEAD TABLE (DRAPES) ×4 IMPLANT
COVER SURGICAL LIGHT HANDLE (MISCELLANEOUS) ×4 IMPLANT
DERMABOND ADVANCED (GAUZE/BANDAGES/DRESSINGS) ×1
DERMABOND ADVANCED .7 DNX12 (GAUZE/BANDAGES/DRESSINGS) ×3 IMPLANT
DRAIN CHANNEL 28F RND 3/8 FF (WOUND CARE) IMPLANT
DRAIN CHANNEL 32F RND 10.7 FF (WOUND CARE) IMPLANT
DRAPE LAPAROSCOPIC ABDOMINAL (DRAPES) ×4 IMPLANT
DRAPE WARM FLUID 44X44 (DRAPE) ×4 IMPLANT
ELECT BLADE 6.5 EXT (BLADE) ×4 IMPLANT
ELECT REM PT RETURN 9FT ADLT (ELECTROSURGICAL) ×4
ELECTRODE REM PT RTRN 9FT ADLT (ELECTROSURGICAL) ×3 IMPLANT
FORCEPS RADIAL JAW LRG 4 PULM (INSTRUMENTS) ×3 IMPLANT
GAUZE SPONGE 4X4 12PLY STRL (GAUZE/BANDAGES/DRESSINGS) ×4 IMPLANT
GLOVE SURG SIGNA 7.5 PF LTX (GLOVE) ×8 IMPLANT
GOWN STRL REUS W/ TWL LRG LVL3 (GOWN DISPOSABLE) ×6 IMPLANT
GOWN STRL REUS W/ TWL XL LVL3 (GOWN DISPOSABLE) ×3 IMPLANT
GOWN STRL REUS W/TWL LRG LVL3 (GOWN DISPOSABLE) ×2
GOWN STRL REUS W/TWL XL LVL3 (GOWN DISPOSABLE) ×1
HEMOSTAT SURGICEL 2X14 (HEMOSTASIS) IMPLANT
KIT BASIN OR (CUSTOM PROCEDURE TRAY) ×4 IMPLANT
KIT ROOM TURNOVER OR (KITS) ×4 IMPLANT
KIT SUCTION CATH 14FR (SUCTIONS) ×4 IMPLANT
NS IRRIG 1000ML POUR BTL (IV SOLUTION) ×8 IMPLANT
PACK CHEST (CUSTOM PROCEDURE TRAY) ×4 IMPLANT
PAD ARMBOARD 7.5X6 YLW CONV (MISCELLANEOUS) ×8 IMPLANT
POUCH ENDO CATCH II 15MM (MISCELLANEOUS) IMPLANT
POUCH SPECIMEN RETRIEVAL 10MM (ENDOMECHANICALS) IMPLANT
RADIAL JAW LRG 4 PULMONARY (INSTRUMENTS) ×1
RELOAD STAPLER GOLD 60MM (STAPLE) ×24 IMPLANT
SEALANT PROGEL (MISCELLANEOUS) IMPLANT
SEALANT SURG COSEAL 4ML (VASCULAR PRODUCTS) IMPLANT
SEALANT SURG COSEAL 8ML (VASCULAR PRODUCTS) IMPLANT
SOLUTION ANTI FOG 6CC (MISCELLANEOUS) ×4 IMPLANT
SPECIMEN JAR MEDIUM (MISCELLANEOUS) ×4 IMPLANT
SPONGE INTESTINAL PEANUT (DISPOSABLE) IMPLANT
SPONGE TONSIL 1 RF SGL (DISPOSABLE) ×4 IMPLANT
STAPLER RELOAD GOLD 60MM (STAPLE) ×32
SUT PROLENE 4 0 RB 1 (SUTURE)
SUT PROLENE 4-0 RB1 .5 CRCL 36 (SUTURE) IMPLANT
SUT SILK  1 MH (SUTURE) ×2
SUT SILK 1 MH (SUTURE) ×6 IMPLANT
SUT SILK 2 0SH CR/8 30 (SUTURE) IMPLANT
SUT SILK 3 0 SH 30 (SUTURE) ×4 IMPLANT
SUT SILK 3 0SH CR/8 30 (SUTURE) IMPLANT
SUT VIC AB 1 CTX 36 (SUTURE)
SUT VIC AB 1 CTX36XBRD ANBCTR (SUTURE) IMPLANT
SUT VIC AB 2-0 CTX 36 (SUTURE) IMPLANT
SUT VIC AB 2-0 UR6 27 (SUTURE) IMPLANT
SUT VIC AB 3-0 MH 27 (SUTURE) IMPLANT
SUT VIC AB 3-0 X1 27 (SUTURE) ×4 IMPLANT
SUT VICRYL 2 TP 1 (SUTURE) IMPLANT
SWAB COLLECTION DEVICE MRSA (MISCELLANEOUS) IMPLANT
SYR 50ML SLIP (SYRINGE) ×4 IMPLANT
SYSTEM SAHARA CHEST DRAIN ATS (WOUND CARE) ×4 IMPLANT
TAPE CLOTH 4X10 WHT NS (GAUZE/BANDAGES/DRESSINGS) ×4 IMPLANT
TIP APPLICATOR SPRAY EXTEND 16 (VASCULAR PRODUCTS) IMPLANT
TOWEL OR 17X24 6PK STRL BLUE (TOWEL DISPOSABLE) ×4 IMPLANT
TOWEL OR 17X26 10 PK STRL BLUE (TOWEL DISPOSABLE) ×8 IMPLANT
TRAP SPECIMEN MUCOUS 40CC (MISCELLANEOUS) ×4 IMPLANT
TRAY FOLEY CATH 16FRSI W/METER (SET/KITS/TRAYS/PACK) ×4 IMPLANT
TROCAR XCEL BLADELESS 5X75MML (TROCAR) ×4 IMPLANT
TROCAR XCEL NON-BLD 5MMX100MML (ENDOMECHANICALS) IMPLANT
TUBE ANAEROBIC SPECIMEN COL (MISCELLANEOUS) IMPLANT
TUBE CONNECTING 12X1/4 (SUCTIONS) ×8 IMPLANT
TUNNELER SHEATH ON-Q 11GX8 DSP (PAIN MANAGEMENT) ×4 IMPLANT
WATER STERILE IRR 1000ML POUR (IV SOLUTION) ×8 IMPLANT

## 2016-05-08 NOTE — H&P (View-Only) (Signed)
PCP is Alinda Deem, MD Referring Provider is Kalman Shan, MD  Chief Complaint  Patient presents with  . Shortness of Breath    on exertion...r/o ILD.Marland KitchenMarland KitchenHI RESOLUTION CT CHEST 04/16/16...eval for lung BX    HPI: Stacy Contreras is referred by Dr. Marchelle Gearing for consideration for lung biopsy.  Stacy Contreras is a 75 year old woman with a past medical history significant for coronary artery disease, DVT, breast cancer, diabetes mellitus without complication, hypertension, arthritis, and "asthma." She has had issues with "asthma" for years. Last fall she was having a lot of pain under her left breast associated with shortness of breath. This would wax and wane over time. In March she felt much worse and went to an urgent care. Chest x-ray was done and she was told she had severe pneumonia. She was treated with antibiotics and prednisone and improved briefly, but her symptoms soon returned.  She saw Dr. Marchelle Gearing in May. She had bronchoscopy which was nondiagnostic. Autoimmune vasculitis workups were negative. She had 2 trials of prednisone each time had some improvement. Her symptoms recurred rapidly each time the prednisone was stopped. Recently her symptoms have been getting worse. She had a CT which showed evidence of worsening interstitial lung disease, and is now referred for biopsy.  She has a history of coronary disease with stents in 2014. She has used nitroglycerin twice in the past year, she is not having any anginal symptoms currently. She has a history of DVT and is on Eliquis for that long term. She had her first DVT back in 80s and then had a more recent one following a left total knee replacement. She is a lifelong nonsmoker and has no significant exposure history.  Zubrod Score: At the time of surgery this patient's most appropriate activity status/level should be described as: []     0    Normal activity, no symptoms [x]     1    Restricted in physical strenuous activity but ambulatory,  able to do out light work []     2    Ambulatory and capable of self care, unable to do work activities, up and about >50 % of waking hours                              []     3    Only limited self care, in bed greater than 50% of waking hours []     4    Completely disabled, no self care, confined to bed or chair []     5    Moribund    Past Medical History:  Diagnosis Date  . Coronary artery disease   . Diabetes mellitus without complication (HCC)   . DVT (deep venous thrombosis) (HCC)   . History of breast cancer   . Hypertension     Past Surgical History:  Procedure Laterality Date  . APPENDECTOMY    . CHOLECYSTECTOMY    . hysterectomy    . left knee replacement    . right breast lumpectomy    . stents x 2    . VIDEO BRONCHOSCOPY Bilateral 12/19/2015   Procedure: VIDEO BRONCHOSCOPY WITHOUT FLUORO;  Surgeon: Kalman Shan, MD;  Location: [facility];  Service: Endoscopy;  Laterality: Bilateral;    Family History  Problem Relation Age of Onset  . Lung disease Mother     ? brown lung  . Colon cancer Father   . Breast cancer Sister   . Breast  cancer Daughter     Social History Social History  Substance Use Topics  . Smoking status: Never Smoker  . Smokeless tobacco: Never Used  . Alcohol use No    Current Outpatient Prescriptions  Medication Sig Dispense Refill  . albuterol (PROVENTIL HFA;VENTOLIN HFA) 108 (90 Base) MCG/ACT inhaler Inhale 2 puffs into the lungs every 4 (four) hours as needed for wheezing or shortness of breath.    Marland Kitchen amLODipine (NORVASC) 5 MG tablet Take 5 mg by mouth daily.    Marland Kitchen apixaban (ELIQUIS) 5 MG TABS tablet Take 0.5 tablets (2.5 mg total) by mouth 2 (two) times daily. Hold on Eliquis the day prior to Bronchoscopy 60 tablet   . aspirin EC 81 MG tablet Take 81 mg by mouth daily.    Marland Kitchen atenolol (TENORMIN) 25 MG tablet Take 25 mg by mouth 2 (two) times daily.    . Black Cohosh 40 MG CAPS Take 120 mg by mouth 2 (two) times daily.    Marland Kitchen  dexlansoprazole (DEXILANT) 60 MG capsule Take 60 mg by mouth daily.    Marland Kitchen docusate sodium (COLACE) 100 MG capsule Take 200 mg by mouth daily as needed for mild constipation.    . Fluticasone-Salmeterol (ADVAIR) 250-50 MCG/DOSE AEPB Inhale 1 puff into the lungs 2 (two) times daily.    Marland Kitchen gabapentin (NEURONTIN) 300 MG capsule Take 600 mg by mouth every evening.    . isosorbide mononitrate (IMDUR) 30 MG 24 hr tablet Take 30 mg by mouth daily.    Marland Kitchen losartan (COZAAR) 100 MG tablet Take 100 mg by mouth at bedtime.    Marland Kitchen lubiprostone (AMITIZA) 24 MCG capsule Take 24 mcg by mouth 2 (two) times daily with a meal.    . metFORMIN (GLUCOPHAGE) 500 MG tablet Take 1,000 mg by mouth 2 (two) times daily with a meal.    . PARoxetine (PAXIL) 20 MG tablet Take 10 mg by mouth daily.    . sitaGLIPtin (JANUVIA) 100 MG tablet Take 100 mg by mouth every evening.    . zolpidem (AMBIEN) 10 MG tablet Take 10 mg by mouth at bedtime as needed for sleep.    Marland Kitchen HYDROcodone-homatropine (HYCODAN) 5-1.5 MG/5ML syrup Take 5 mLs by mouth every 6 (six) hours as needed for cough. (Patient not taking: Reported on 04/28/2016) 120 mL 0   No current facility-administered medications for this visit.     Allergies  Allergen Reactions  . Codeine Other (See Comments)    Fast heart beat  . Statins     Muscle weakness  . Sulfa Antibiotics Rash    Review of Systems  Constitutional: Positive for activity change and fatigue. Negative for chills, fever and unexpected weight change.  HENT: Positive for hearing loss and voice change. Negative for trouble swallowing.   Respiratory: Positive for cough, shortness of breath and wheezing.   Cardiovascular: Negative for chest pain and leg swelling.  Gastrointestinal: Positive for abdominal pain. Negative for blood in stool.  Genitourinary: Negative for difficulty urinating and dysuria.  Musculoskeletal: Positive for arthralgias and joint swelling.  Neurological: Negative for seizures, syncope and  weakness.  Hematological: Negative for adenopathy. Bruises/bleeds easily.  All other systems reviewed and are negative.   BP 139/62   Pulse 69   Resp 16   Ht 5\' 5"  (1.651 m)   Wt 144 lb (65.3 kg)   SpO2 97% Comment: ON RA  BMI 23.96 kg/m  Physical Exam  Constitutional: She is oriented to person, place, and time. She appears  well-developed and well-nourished. No distress.  HENT:  Head: Normocephalic and atraumatic.  Mouth/Throat: No oropharyngeal exudate.  Eyes: Conjunctivae and EOM are normal. No scleral icterus.  Neck: Neck supple. No thyromegaly present.  Cardiovascular: Normal rate, regular rhythm and normal heart sounds.  Exam reveals no gallop and no friction rub.   No murmur heard. Pulmonary/Chest: Effort normal and breath sounds normal. No respiratory distress. She has no wheezes. She has no rales.  Abdominal: Soft. She exhibits no distension. There is no tenderness.  Musculoskeletal: She exhibits no edema.  Lymphadenopathy:    She has no cervical adenopathy.  Neurological: She is oriented to person, place, and time. No cranial nerve deficit. She exhibits normal muscle tone.  Skin: Skin is warm and dry.  Vitals reviewed.    Diagnostic Tests: CT CHEST WITHOUT CONTRAST  TECHNIQUE: Multidetector CT imaging of the chest was performed following the standard protocol without intravenous contrast. High resolution imaging of the lungs, as well as inspiratory and expiratory imaging, was performed.  COMPARISON:  High-resolution chest CT 01/20/2016.  FINDINGS: Cardiovascular: Heart size is normal. There is no significant pericardial fluid, thickening or pericardial calcification. There is aortic atherosclerosis, as well as atherosclerosis of the great vessels of the mediastinum and the coronary arteries, including calcified atherosclerotic plaque in the left main, left anterior descending, left circumflex and right coronary arteries.  Mediastinum/Nodes: No  pathologically enlarged mediastinal or hilar lymph nodes. Multiple densely calcified mediastinal and left hilar lymph nodes are noted. Esophagus is unremarkable in appearance. No axillary lymphadenopathy.  Lungs/Pleura: High-resolution images again demonstrate patchy areas of ground-glass attenuation most evident throughout the mid to lower lungs bilaterally. Many of these areas are associated with some septal thickening and architectural distortion. The areas of greatest involvement also demonstrates some mild thickening of the peribronchovascular interstitium. There is some mild subpleural reticulation, most evident in the inferior aspect of the right middle lobe. No frank honeycombing is identified. Mild peripheral bronchiolectasis is noted. No frank traction bronchiectasis. While the overall appearance is very similar to the prior examination, there are several new areas of very mild ground-glass attenuation compared to the prior examination, most evident throughout the mid to upper lungs. Inspiratory and expiratory imaging demonstrates moderate air trapping, indicative of small airways disease. No acute consolidative airspace disease. No pleural effusions. 2 small pulmonary nodules are noted in the left lung, which appear unchanged compared to the prior examination from 01/20/2016, with the largest nodule measuring 6 x 3 mm (mean diameter of 4.5 mm) in the medial aspect of the right upper lobe (image 38 of series 5). No other more suspicious appearing pulmonary nodules or masses are noted.  Upper Abdomen: Status post cholecystectomy.  Aortic atherosclerosis.  Musculoskeletal: There are no aggressive appearing lytic or blastic lesions noted in the visualized portions of the skeleton.  IMPRESSION: 1. The appearance of the lungs is compatible with interstitial lung disease. While the overall appearance favors nonspecific interstitial pneumonia (NSIP), there are several new  patchy areas of ground-glass attenuation noted throughout the lungs bilaterally which would be somewhat unusual for an entity such as NSIP. Repeat high-resolution chest CT is recommended in 6-12 months to further assess temporal changes in the appearance of the lung parenchyma. 2. Small pulmonary nodules in the right lung, as above, stable compared to prior examinations. Attention at time of repeat high-resolution chest CT is recommended to ensure continued stability. 3. Aortic atherosclerosis, in addition to left main and 3 vessel coronary artery disease. Please note that although  the presence of coronary artery calcium documents the presence of coronary artery disease, the severity of this disease and any potential stenosis cannot be assessed on this non-gated CT examination. Assessment for potential risk factor modification, dietary therapy or pharmacologic therapy may be warranted, if clinically indicated. 4. Moderate air trapping indicative of small airways disease.   Electronically Signed   By: Trudie Reedaniel  Entrikin M.D.   On: 04/16/2016 15:17  I personally reviewed the CT chest and concur with the findings noted above  Impression: 75 year old woman with interstitial lung disease he has had worsening symptoms recently. Dr. Marchelle Gearingamaswamy needs a more definitive diagnosis in order to guide treatment and provide prognostic information. She has had bronchoscopy which was nondiagnostic. He has now referred her for a lung biopsy.  I had a long discussion with Mrs. Fadeley and her husband regarding VATS lung biopsy. Her disease is fairly symmetric so I would favor the right side. I informed them that this is an actual surgical procedure. They understand that it would be done in the operating room under general anesthesia, the incisions be used, the use of a drainage tube postoperatively, the expected hospital stay, and the overall recovery. She understands no guarantee of a definitive diagnosis  can be given. I informed them of the indications, risks, benefits, and alternatives. She understands the risks include, but are not limited to death, MI, DVT, PE, bleeding, possible need for transfusion, infection, prolonged air leak, cardiac arrhythmias, as well as the possibility of other unforeseeable complications.  I spoke to them briefly about the BRAVE study involving bronchoscopy with biopsy and bronchoalveolar lavage. She is interested in that study, I will pass that along to Dr. Marchelle Gearingamaswamy.  Plan: Right VATS, lung biopsy, possible bronchoscopy on Friday, 05/08/2016  Loreli SlotSteven C Dailey Alberson, MD [facility] [phone]

## 2016-05-08 NOTE — Anesthesia Preprocedure Evaluation (Addendum)
Anesthesia Evaluation  Patient identified by MRN, date of birth, ID band Patient awake    Reviewed: Allergy & Precautions, NPO status , Patient's Chart, lab work & pertinent test results  History of Anesthesia Complications Negative for: history of anesthetic complications  Airway Mallampati: II  TM Distance: >3 FB Neck ROM: Full    Dental  (+) Dental Advisory Given, Teeth Intact   Pulmonary shortness of breath, asthma ,    breath sounds clear to auscultation       Cardiovascular hypertension, Pt. on medications and Pt. on home beta blockers (-) angina+ CAD, + Cardiac Stents and + DVT   Rhythm:Regular  LAD stent 2014, normal ef   Neuro/Psych negative neurological ROS     GI/Hepatic Neg liver ROS, GERD  ,  Endo/Other  diabetes, Type 2  Renal/GU Renal disease     Musculoskeletal  (+) Arthritis ,   Abdominal   Peds  Hematology   Anesthesia Other Findings   Reproductive/Obstetrics                           Anesthesia Physical Anesthesia Plan  ASA: III  Anesthesia Plan: General   Post-op Pain Management:    Induction: Intravenous  Airway Management Planned: Oral ETT and Double Lumen EBT  Additional Equipment: Arterial line  Intra-op Plan:   Post-operative Plan: Extubation in OR and Possible Post-op intubation/ventilation  Informed Consent: I have reviewed the patients History and Physical, chart, labs and discussed the procedure including the risks, benefits and alternatives for the proposed anesthesia with the patient or authorized representative who has indicated his/her understanding and acceptance.   Dental advisory given  Plan Discussed with: CRNA, Anesthesiologist and Surgeon  Anesthesia Plan Comments:         Anesthesia Quick Evaluation

## 2016-05-08 NOTE — OR Nursing (Signed)
Specimens [facility]. Given to Kinnie FeilStacey Phelps, RN and Isabella BowensFrances Aberion RN

## 2016-05-08 NOTE — Anesthesia Procedure Notes (Signed)
Procedure Name: Intubation Date/Time: 05/08/2016 2:42 PM Performed by: Rise PatienceBELL, Forrest Demuro T Pre-anesthesia Checklist: Patient identified, Emergency Drugs available, Suction available and Patient being monitored Patient Re-evaluated:Patient Re-evaluated prior to inductionOxygen Delivery Method: Circle System Utilized Preoxygenation: Pre-oxygenation with 100% oxygen Intubation Type: Inhalational induction Laryngoscope Size: Miller and 2 Grade View: Grade II Endobronchial tube: Double lumen EBT, EBT position confirmed by auscultation and EBT position confirmed by fiberoptic bronchoscope and 37 Fr Number of attempts: 1 Airway Equipment and Method: Stylet Placement Confirmation: ETT inserted through vocal cords under direct vision,  positive ETCO2 and breath sounds checked- equal and bilateral Tube secured with: Tape Dental Injury: Teeth and Oropharynx as per pre-operative assessment

## 2016-05-08 NOTE — Research (Signed)
This RN and Isabella BowensFrances Aberion, RN saw patient and spouse in pre-op area prior to surgery.  Patient again confirmed no questions with the procedure and is willing to proceed with the Brave-1 Research study.  A copy of the signed informed consent form for the Brave-1 study was placed in the subject's paper chart.  Specimen collected for research (BAL, upper and lower lobes (Right), Transbronchial Biopsy, two upper and three lower lobes (right) ).  Specimen processed and will be shipped on Monday.  Kinnie FeilStacey Phelps, RN Research Nurse Office [phone]

## 2016-05-08 NOTE — Interval H&P Note (Signed)
History and Physical Interval Note:  05/08/2016 1:32 PM  Stacy LenisAnnie L Coopersmith  has presented today for surgery, with the diagnosis of ILD  The various methods of treatment have been discussed with the patient and family. After consideration of risks, benefits and other options for treatment, the patient has consented to  Procedure(s): VIDEO ASSISTED THORACOSCOPY (Right) LUNG BIOPSY (Right) as a surgical intervention .  The patient's history has been reviewed, patient examined, no change in status, stable for surgery.  I have reviewed the patient's chart and labs.  Questions were answered to the patient's satisfaction.     Loreli SlotSteven C Suleima Ohlendorf

## 2016-05-08 NOTE — Brief Op Note (Addendum)
05/08/2016  3:57 PM  PATIENT:  Stacy LenisAnnie L Contreras  75 y.o. female  PRE-OPERATIVE DIAGNOSIS:  ILD  POST-OPERATIVE DIAGNOSIS:  interstitial lung disease  PROCEDURE:   Bronchoscopy with biopsies and BAL Right VATS Lung biopsy x 3 ( upper, middle and lower lobes) On-Q local anesthetic catheter placement   SURGEON:  Surgeon(s) and Role:    * Loreli SlotSteven C Kaylon Laroche, MD - Primary  PHYSICIAN ASSISTANT: WAYNE GOLD PA-C  ASSISTANTS: NO OTHER   ANESTHESIA:   general  EBL:  Total I/O In: 1400 [I.V.:1400] Out: 250 [Urine:250]  BLOOD ADMINISTERED:none  DRAINS: 1 Chest Tube(s) in the RIGHT HEMITHORAX   LOCAL MEDICATIONS USED:  MARCAINE     SPECIMEN:  1 Chest Tube(s) in the RIGHT HEMITHORAX  DISPOSITION OF SPECIMEN:  PATHOLOGY  COUNTS:  YES  PLAN OF CARE: Admit to inpatient   PATIENT DISPOSITION:  PACU - hemodynamically stable.   Delay start of Pharmacological VTE agent (>24hrs) due to surgical blood loss or risk of bleeding: yes  FROZEN: ILD, POSSIBLE UIP

## 2016-05-08 NOTE — Transfer of Care (Signed)
Immediate Anesthesia Transfer of Care Note  Patient: Stacy LenisAnnie L Contreras  Procedure(s) Performed: Procedure(s): VIDEO ASSISTED THORACOSCOPY (Right) LUNG BIOPSY (Right) VIDEO BRONCHOSCOPY (N/A)  Patient Location: PACU  Anesthesia Type:General  Level of Consciousness: awake, alert  and oriented  Airway & Oxygen Therapy: Patient Spontanous Breathing and Patient connected to face mask oxygen  Post-op Assessment: Report given to RN, Post -op Vital signs reviewed and stable and Patient moving all extremities X 4  Post vital signs: Reviewed and stable  Last Vitals:  Vitals:   05/08/16 1120  BP: (!) 143/51  Pulse: (!) 57  Resp: 20  Temp: 37 C    Last Pain:  Vitals:   05/08/16 1120  TempSrc: Oral      Patients Stated Pain Goal: 5 (05/08/16 1103)  Complications: No apparent anesthesia complications

## 2016-05-08 NOTE — Interval H&P Note (Signed)
History and Physical Interval Note:  05/08/2016 1:33 PM  Stacy Contreras  has presented today for surgery, with the diagnosis of ILD  The various methods of treatment have been discussed with the patient and family. After consideration of risks, benefits and other options for treatment, the patient has consented to  Procedure(s): VIDEO ASSISTED THORACOSCOPY (Right) LUNG BIOPSY (Right) as a surgical intervention .  The patient's history has been reviewed, patient examined, no change in status, stable for surgery.  I have reviewed the patient's chart and labs.  Questions were answered to the patient's satisfaction.     Loreli SlotSteven C Hendrickson

## 2016-05-08 NOTE — Anesthesia Procedure Notes (Signed)
Procedure Name: Intubation Date/Time: 05/08/2016 1:56 PM Performed by: Rise PatienceBELL, Karlei Waldo T Pre-anesthesia Checklist: Patient identified, Emergency Drugs available, Suction available and Patient being monitored Patient Re-evaluated:Patient Re-evaluated prior to inductionOxygen Delivery Method: Circle System Utilized Preoxygenation: Pre-oxygenation with 100% oxygen Intubation Type: IV induction Ventilation: Mask ventilation without difficulty Laryngoscope Size: Mac and 3 Grade View: Grade II Tube type: Oral Tube size: 8.5 mm Number of attempts: 1 Airway Equipment and Method: Stylet and Oral airway Placement Confirmation: ETT inserted through vocal cords under direct vision,  positive ETCO2 and breath sounds checked- equal and bilateral Secured at: 22 cm Tube secured with: Tape Dental Injury: Teeth and Oropharynx as per pre-operative assessment  Comments: Intubation by Glo HerringHeather Lee, CRNA

## 2016-05-09 ENCOUNTER — Inpatient Hospital Stay (HOSPITAL_COMMUNITY): Payer: Medicare Other

## 2016-05-09 LAB — BLOOD GAS, ARTERIAL
Acid-Base Excess: 3.8 mmol/L — ABNORMAL HIGH (ref 0.0–2.0)
Bicarbonate: 29 mmol/L — ABNORMAL HIGH (ref 20.0–28.0)
Drawn by: 460981
O2 Content: 4 L/min
O2 Saturation: 92.7 %
Patient temperature: 98.6
pCO2 arterial: 54.7 mmHg — ABNORMAL HIGH (ref 32.0–48.0)
pH, Arterial: 7.345 — ABNORMAL LOW (ref 7.350–7.450)
pO2, Arterial: 71.2 mmHg — ABNORMAL LOW (ref 83.0–108.0)

## 2016-05-09 LAB — CBC
HCT: 33.1 % — ABNORMAL LOW (ref 36.0–46.0)
Hemoglobin: 10.8 g/dL — ABNORMAL LOW (ref 12.0–15.0)
MCH: 30.7 pg (ref 26.0–34.0)
MCHC: 32.6 g/dL (ref 30.0–36.0)
MCV: 94 fL (ref 78.0–100.0)
Platelets: 201 10*3/uL (ref 150–400)
RBC: 3.52 MIL/uL — ABNORMAL LOW (ref 3.87–5.11)
RDW: 13.7 % (ref 11.5–15.5)
WBC: 6.5 10*3/uL (ref 4.0–10.5)

## 2016-05-09 LAB — GLUCOSE, CAPILLARY
Glucose-Capillary: 89 mg/dL (ref 65–99)
Glucose-Capillary: 98 mg/dL (ref 65–99)
Glucose-Capillary: 100 mg/dL — ABNORMAL HIGH (ref 65–99)
Glucose-Capillary: 111 mg/dL — ABNORMAL HIGH (ref 65–99)

## 2016-05-09 LAB — BASIC METABOLIC PANEL
Anion gap: 6 (ref 5–15)
BUN: 6 mg/dL (ref 6–20)
CO2: 28 mmol/L (ref 22–32)
Calcium: 8.7 mg/dL — ABNORMAL LOW (ref 8.9–10.3)
Chloride: 104 mmol/L (ref 101–111)
Creatinine, Ser: 0.65 mg/dL (ref 0.44–1.00)
GFR calc Af Amer: >60 mL/min (ref >60)
GFR calc non Af Amer: >60 mL/min (ref >60)
Glucose, Bld: 97 mg/dL (ref 65–99)
Potassium: 3.8 mmol/L (ref 3.5–5.1)
Sodium: 138 mmol/L (ref 135–145)

## 2016-05-09 MED ORDER — INSULIN ASPART 100 UNIT/ML ~~LOC~~ SOLN: 0.0000 [IU] | Freq: Three times a day (TID) | SUBCUTANEOUS | Status: DC

## 2016-05-09 MED ORDER — ZOLPIDEM TARTRATE 5 MG PO TABS
10.0000 mg | ORAL_TABLET | Freq: Every evening | ORAL | Status: DC | PRN
  Administered 2016-05-09: 10 mg via ORAL

## 2016-05-09 MED ORDER — ENOXAPARIN SODIUM 40 MG/0.4ML ~~LOC~~ SOLN
40.0000 mg | SUBCUTANEOUS | Status: DC
  Administered 2016-05-09 – 2016-05-11 (×3): 40 mg via SUBCUTANEOUS
  Filled 2016-05-09 (×3): qty 0.4

## 2016-05-09 MED ORDER — ZOLPIDEM TARTRATE 5 MG PO TABS
5.0000 mg | ORAL_TABLET | Freq: Every evening | ORAL | Status: DC | PRN
  Filled 2016-05-09 (×2): qty 1

## 2016-05-09 NOTE — Op Note (Signed)
NAMPatrice Paradise:  Stacy Contreras, Stacy Contreras                 ACCOUNT NO.:  [id number]  MEDICAL RECORD NO.:  [id number]  LOCATION:  3S12C                        FACILITY:  [facility]  PHYSICIAN:  Salvatore DecentSteven C. Dorris FetchHendrickson, M.D.DATE OF BIRTH:  08/23/1940  DATE OF PROCEDURE:  05/08/2016 DATE OF DISCHARGE:                              OPERATIVE REPORT   PREOPERATIVE DIAGNOSIS:  Interstitial lung disease.  POSTOPERATIVE DIAGNOSIS:  Interstitial lung disease.  PROCEDURE:   Bronchoscopy with biopsies and bronchoalveolar lavage. Right video-assisted thoracoscopy with lung biopsy x 3 (upper, middle, and lower lobes).   Placement of On-Q local anesthetic catheter.  SURGEON:  Salvatore DecentSteven C. Dorris FetchHendrickson, M.D.  ASSISTANT:  Rowe ClackWayne E. Gold, P.A.-C.  ANESTHESIA:  General.  FINDINGS:  On bronchoscopy, normal endobronchial anatomy with no endobronchial lesions to the level of subsegmental bronchi.  Frozen section of the lower lobe biopsy showed interstitial lung disease.  CLINICAL NOTE:  Stacy Contreras is a 75 year old woman with interstitial lung disease.  She has had an extensive workup that has been nondiagnostic and her symptoms recur rapidly every time prednisone is stopped.  Her symptoms have recently worsened, and a CT showed worsening interstitial lung disease.  She was referred for biopsy.  The indications, risks, benefits, and alternatives were discussed in detail with the patient. She did consent to the BRAVE study.  She consented to surgery and wished to proceed.  OPERATIVE NOTE:  Stacy Contreras was brought to the operating room on May 08, 2016.  She had an induction of anesthesia, intravenous antibiotics were administered, a Foley catheter was placed, sequential compression devices were placed on the calves for DVT prophylaxis. Flexible fiberoptic bronchoscopy was performed via the endotracheal tube.  It revealed normal endobronchial anatomy with no endobronchial lesions to the level of subsegmental bronchi.  Per the  BRAVE study protocol, biopsies were obtained from the upper and lower lobe with fluoroscopy guidance.  Bronchoalveolar lavage then was performed in both the upper and lower lobes with installation of 120 mL of saline.  The bronchoscope was removed.  The patient was reintubated with a double- lumen endotracheal tube.  She was placed in a left lateral decubitus position, and the right chest was prepped and draped in usual sterile fashion.  Single lung ventilation of the left lung was initiated and was tolerated well throughout the procedure.  An incision was made in the seventh interspace in the midaxillary line. It was carried through the skin and subcutaneous tissue.  A 5 mm port was inserted.  There was good isolation of the right lung.  A 5 cm working incision was made in the fifth interspace.  No rib spreading was performed during the procedure.  A biopsy was taken of the lower lobe along its inferior margin, this was done with sequential firings of an endoscopic stapler.  The Eth[employer] Echelon stapler with 60 mm gold cartridges was used.  The specimen was removed and sent for frozen section.  While awaiting those results, biopsies were obtained in a similar fashion from the inferior aspect of the upper lobe and the middle lobe.  These were sent for permanent pathology only.  An incision was made posteriorly and an On-Q local anesthetic catheter  was tunneled into a subpleural location.  It was primed with 5 mL of 0.5% Marcaine.  The staple lines were inspected, and there was no bleeding.  The frozen section returned showing interstitial lung disease suspicious for UIP. A 28-French chest tube was placed via the original port incision and directed to the apex.  The right lung was reinflated.  There was good reinflation of all 3 lobes.  The working incision was closed in standard fashion.  The chest tube was placed to suction.  She was placed back in a supine position.  She was extubated  in the operating room and taken to postanesthetic care unit in good condition.     Salvatore Decent Dorris Fetch, M.D.     SCH/MEDQ  D:  05/08/2016  T:  05/09/2016  Job:  [id number]

## 2016-05-09 NOTE — Progress Notes (Signed)
1 Day Post-Op Procedure(s) (LRB): VIDEO ASSISTED THORACOSCOPY (Right) LUNG BIOPSY (Right) VIDEO BRONCHOSCOPY (N/A) Subjective: Some incisional pain but "not too bad" Denies nausea  Objective: Vital signs in last 24 hours: Temp:  [97.5 F (36.4 C)-98.7 F (37.1 C)] 97.7 F (36.5 C) (09/30 0713) Pulse Rate:  [55-72] 55 (09/30 0713) Cardiac Rhythm: Normal sinus rhythm (09/30 0800) Resp:  [16-30] 19 (09/30 0800) BP: (99-143)/(37-63) 102/63 (09/30 0713) SpO2:  [90 %-99 %] 99 % (09/30 0800) Arterial Line BP: (140-168)/(43-58) 168/58 (09/29 2010) Weight:  [151 lb 7.3 oz (68.7 kg)] 151 lb 7.3 oz (68.7 kg) (09/29 1900)  Hemodynamic parameters for last 24 hours:    Intake/Output from previous day: 09/29 0701 - 09/30 0700 In: 2010 [P.O.:480; I.V.:1400; IV Piggyback:50] Out: 830 [Urine:550; Blood:50; Chest Tube:230] Intake/Output this shift: Total I/O In: -  Out: 525 [Urine:525]  General appearance: alert, cooperative and no distress Neurologic: intact Heart: regular rate and rhythm Lungs: crackles at bases o/w clear Abdomen: normal findings: soft, non-tender no air leak  Lab Results:  Recent Labs  05/06/16 1350 05/09/16 0452  WBC 6.3 6.5  HGB 12.8 10.8*  HCT 38.5 33.1*  PLT 258 201   BMET:  Recent Labs  05/06/16 1350 05/09/16 0452  NA 137 138  K 4.3 3.8  CL 105 104  CO2 21* 28  GLUCOSE 113* 97  BUN 10 6  CREATININE 0.74 0.65  CALCIUM 9.2 8.7*    PT/INR:  Recent Labs  05/08/16 1056  LABPROT 13.1  INR 0.99   ABG    Component Value Date/Time   PHART 7.345 (L) 05/09/2016 0422   HCO3 29.0 (H) 05/09/2016 0422   ACIDBASEDEF 0.3 05/06/2016 1351   O2SAT 92.7 05/09/2016 0422   CBG (last 3)   Recent Labs  05/08/16 1953 05/08/16 2303 05/09/16 0602  GLUCAP 107* 122* 89    Assessment/Plan: S/P Procedure(s) (LRB): VIDEO ASSISTED THORACOSCOPY (Right) LUNG BIOPSY (Right) VIDEO BRONCHOSCOPY (N/A) Doing well POD # 1  No air leak- CT to water  seal IS, cough Ambulate- has already been around unit this AM SCD = enoxaparin for DVT prophylaxis Advance diet   LOS: 1 day    Loreli SlotSteven C Ruhan Borak 05/09/2016

## 2016-05-09 NOTE — Progress Notes (Signed)
Patient ambulated approximately 310 feet on 4L oxygen using front wheel walker.  Patient denied SOB or dizziness during ambulation. Patient tolerated well, VS remained stable. Now sitting in chair and tolerating 3L oxygen.

## 2016-05-10 ENCOUNTER — Inpatient Hospital Stay (HOSPITAL_COMMUNITY): Payer: Medicare Other

## 2016-05-10 ENCOUNTER — Encounter (HOSPITAL_COMMUNITY): Payer: Self-pay | Admitting: Thoracic Surgery (Cardiothoracic Vascular Surgery)

## 2016-05-10 LAB — GLUCOSE, CAPILLARY
Glucose-Capillary: 100 mg/dL — ABNORMAL HIGH (ref 65–99)
Glucose-Capillary: 113 mg/dL — ABNORMAL HIGH (ref 65–99)
Glucose-Capillary: 126 mg/dL — ABNORMAL HIGH (ref 65–99)
Glucose-Capillary: 139 mg/dL — ABNORMAL HIGH (ref 65–99)

## 2016-05-10 LAB — COMPREHENSIVE METABOLIC PANEL
ALT: 26 U/L (ref 14–54)
AST: 29 U/L (ref 15–41)
Albumin: 2.8 g/dL — ABNORMAL LOW (ref 3.5–5.0)
Alkaline Phosphatase: 39 U/L (ref 38–126)
Anion gap: 5 (ref 5–15)
BUN: 6 mg/dL (ref 6–20)
CO2: 29 mmol/L (ref 22–32)
Calcium: 8.5 mg/dL — ABNORMAL LOW (ref 8.9–10.3)
Chloride: 103 mmol/L (ref 101–111)
Creatinine, Ser: 0.69 mg/dL (ref 0.44–1.00)
GFR calc Af Amer: >60 mL/min (ref >60)
GFR calc non Af Amer: >60 mL/min (ref >60)
Glucose, Bld: 99 mg/dL (ref 65–99)
Potassium: 3.9 mmol/L (ref 3.5–5.1)
Sodium: 137 mmol/L (ref 135–145)
Total Bilirubin: 0.9 mg/dL (ref 0.3–1.2)
Total Protein: 4.8 g/dL — ABNORMAL LOW (ref 6.5–8.1)

## 2016-05-10 LAB — CBC
HCT: 32.5 % — ABNORMAL LOW (ref 36.0–46.0)
Hemoglobin: 10.5 g/dL — ABNORMAL LOW (ref 12.0–15.0)
MCH: 30.4 pg (ref 26.0–34.0)
MCHC: 32.3 g/dL (ref 30.0–36.0)
MCV: 94.2 fL (ref 78.0–100.0)
Platelets: 188 10*3/uL (ref 150–400)
RBC: 3.45 MIL/uL — ABNORMAL LOW (ref 3.87–5.11)
RDW: 13.2 % (ref 11.5–15.5)
WBC: 5.2 10*3/uL (ref 4.0–10.5)

## 2016-05-10 MED ORDER — ZOLPIDEM TARTRATE 5 MG PO TABS
5.0000 mg | ORAL_TABLET | Freq: Every evening | ORAL | Status: DC | PRN
  Administered 2016-05-10: 5 mg via ORAL
  Filled 2016-05-10: qty 1

## 2016-05-10 NOTE — Progress Notes (Signed)
2 Days Post-Op Procedure(s) (LRB): VIDEO ASSISTED THORACOSCOPY (Right) LUNG BIOPSY (Right) VIDEO BRONCHOSCOPY (N/A) Subjective: RN noted confusion earlier this morning Seems appropriate now and family has not noted any issues over past 30 minutes  Objective: Vital signs in last 24 hours: Temp:  [98 F (36.7 Contreras)-98.3 F (36.8 Contreras)] 98.1 F (36.7 Contreras) (10/01 0726) Pulse Rate:  [54-64] 55 (10/01 0726) Cardiac Rhythm: Sinus bradycardia (10/01 0749) Resp:  [13-24] 20 (10/01 0749) BP: (96-135)/(50-82) 108/64 (10/01 0726) SpO2:  [92 %-100 %] 96 % (10/01 0749)  Hemodynamic parameters for last 24 hours:    Intake/Output from previous day: 09/30 0701 - 10/01 0700 In: 240 [P.O.:240] Out: 3040 [Urine:2800; Chest Tube:240] Intake/Output this shift: Total I/O In: 360 [P.O.:360] Out: -   General appearance: alert, cooperative and no distress Neurologic: intact Heart: regular rate and rhythm Lungs: rales bibasilar no air leak  Lab Results:  Recent Labs  05/09/16 0452 05/10/16 0404  WBC 6.5 5.2  HGB 10.8* 10.5*  HCT 33.1* 32.5*  PLT 201 188   BMET:  Recent Labs  05/09/16 0452 05/10/16 0404  NA 138 137  K 3.8 3.9  CL 104 103  CO2 28 29  GLUCOSE 97 99  BUN 6 6  CREATININE 0.65 0.69  CALCIUM 8.7* 8.5*    PT/INR:  Recent Labs  05/08/16 1056  LABPROT 13.1  INR 0.99   ABG    Component Value Date/Time   PHART 7.345 (L) 05/09/2016 0422   HCO3 29.0 (H) 05/09/2016 0422   ACIDBASEDEF 0.3 05/06/2016 1351   O2SAT 92.7 05/09/2016 0422   CBG (last 3)   Recent Labs  05/09/16 1619 05/09/16 2213 05/10/16 0732  GLUCAP 100* 111* 100*    Assessment/Plan: S/P Procedure(s) (LRB): VIDEO ASSISTED THORACOSCOPY (Right) LUNG BIOPSY (Right) VIDEO BRONCHOSCOPY (N/A) -Confusion this AM may be relate to Ambien and/or pain meds Takes 10 mg at home, will decrease to 5 mg Dc PCA DC chest tube Home in AM if she continues to progress   LOS: 2 days    Stacy Contreras  Arick Mareno 05/10/2016

## 2016-05-10 NOTE — Progress Notes (Signed)
Patient is able to answer all questions correctly but reports she doesn't know why she isn't in the bed. Patient doesn't recall getting up this morning and that staff helped her to sit in the chair. Patient stated that she hasn't felt confused and hasn't had any problems with forgetfulness with sleeping medication at home. Pt was re-oriented, support and encouragement given. Will continue to monitor.l

## 2016-05-11 ENCOUNTER — Inpatient Hospital Stay (HOSPITAL_COMMUNITY): Payer: Medicare Other

## 2016-05-11 LAB — GLUCOSE, CAPILLARY
Glucose-Capillary: 105 mg/dL — ABNORMAL HIGH (ref 65–99)
Glucose-Capillary: 107 mg/dL — ABNORMAL HIGH (ref 65–99)

## 2016-05-11 MED ORDER — TRAMADOL HCL 50 MG PO TABS: 50.0000 mg | ORAL_TABLET | Freq: Four times a day (QID) | ORAL | 0 refills | Status: DC | PRN

## 2016-05-11 NOTE — Progress Notes (Signed)
Doree Fudgeonielle Zimmerman, PA, will address the Tramadol order needed for d/c.

## 2016-05-11 NOTE — Care Management Important Message (Signed)
Important Message  Patient Details  Name: Stacy Contreras MRN: [id number] Date of Birth: January 19, 1941   Medicare Important Message Given:  Yes    Kyla BalzarineShealy, Detavious Rinn Abena 05/11/2016, 12:49 PM

## 2016-05-11 NOTE — Progress Notes (Signed)
Patient walked 300 feet in hallway on room air and maintained a sat of 96% or greater with a good waveform.  Awaiting printed Tramadol script for d/c home.  All d/c instructions have been reviewed with patient.

## 2016-05-11 NOTE — Discharge Summary (Signed)
Physician Discharge Summary  Patient ID: Stacy Contreras MRN: [id number] DOB/AGE: 1940-10-20 75 y.o.  Admit date: 05/08/2016 Discharge date: 05/11/2016  Admission Diagnoses:  Patient Active Problem List   Diagnosis Date Noted  . Interstitial lung disorders (HCC) 05/08/2016  . Dyspnea and respiratory abnormality 02/24/2016  . UTI (lower urinary tract infection) 12/12/2015  . Bronchitis 12/12/2015  . Hypoxia 12/12/2015  . Coronary artery disease 12/12/2015  . Diabetes mellitus without complication (HCC) 12/12/2015  . Hypertension 12/12/2015  . DVT (deep venous thrombosis) (HCC) 12/12/2015  . ILD (interstitial lung disease) (HCC) 12/12/2015  . Acute on chronic respiratory failure with hypoxia (HCC) 12/12/2015  . SOB (shortness of breath) 12/11/2015    Discharge Diagnoses:   Patient Active Problem List   Diagnosis Date Noted  . Interstitial lung disorders (HCC) 05/08/2016  . Dyspnea and respiratory abnormality 02/24/2016  . UTI (lower urinary tract infection) 12/12/2015  . Bronchitis 12/12/2015  . Hypoxia 12/12/2015  . Coronary artery disease 12/12/2015  . Diabetes mellitus without complication (HCC) 12/12/2015  . Hypertension 12/12/2015  . DVT (deep venous thrombosis) (HCC) 12/12/2015  . ILD (interstitial lung disease) (HCC) 12/12/2015  . Acute on chronic respiratory failure with hypoxia (HCC) 12/12/2015  . SOB (shortness of breath) 12/11/2015   Discharged Condition: good  History of Present Illness:  Stacy Contreras is a 75 year old woman with a past medical history significant for coronary artery disease, DVT, breast cancer, diabetes mellitus without complication, hypertension, arthritis, and "asthma." She has had issues with "asthma" for years. Last fall she was having a lot of pain under her left breast associated with shortness of breath. This would wax and wane over time. In March she felt much worse and went to an urgent care. Chest x-ray was done and she was told she had  severe pneumonia. She was treated with antibiotics and prednisone and improved briefly, but her symptoms soon returned.  She saw Dr. Marchelle Gearing in May. She had bronchoscopy which was nondiagnostic. Autoimmune vasculitis workups were negative. She had 2 trials of prednisone each time had some improvement. Her symptoms recurred rapidly each time the prednisone was stopped. Recently her symptoms have been getting worse. She had a CT which showed evidence of worsening interstitial lung disease, and was referred for biopsy.  She was evaluated by Dr. Dorris Fetch who was in agreement the patient should undergo VATS procedure for diagnostic biopsy.  The risks and benefits of the procedure were explained to the patient and she was agreeable to proceed.  Hospital Course:   Ms. Civil presented to [facility] on [facility]08[facility].  She was taken to the operating room and underwent Bronchoscopy with biopsies and BAL, Right VATS with lung biopsy x 3 and placemen of ON- Q anesthetic catheter.  She tolerated the procedure, was extubated and taken to the PACU in stable condition.  The patient developed some minor confusion post operatively.  Her chest tube did not exhibit evidence of an air leak and it was placed to water seal on POD #1.  Her CXR remained stable without pneumothorax.  Her chest tube remained free from air leak and her chest tube was removed on POD #2.  The patient was ambulating independently.  Her pain was well controlled.  She was tolerating a regular diet.  Repeat CXR after chest tube removal was free from pneumothorax.  The patient was medically stable and was discharged home today.     Significant Diagnostic Studies: Pathology: pending  Disposition: 01-Home or Self Care  Discharge Medications:     Medication List    TAKE these medications   acetaminophen 500 MG tablet Commonly known as:  TYLENOL Take 1,000 mg by mouth 2 (two) times daily as needed for headache (pain).   albuterol 108 (90  Base) MCG/ACT inhaler Commonly known as:  PROVENTIL HFA;VENTOLIN HFA Inhale 2 puffs into the lungs every 4 (four) hours as needed for wheezing or shortness of breath.   amLODipine 5 MG tablet Commonly known as:  NORVASC Take 5 mg by mouth at bedtime.   apixaban 5 MG Tabs tablet Commonly known as:  ELIQUIS Take 0.5 tablets (2.5 mg total) by mouth 2 (two) times daily. Hold on Eliquis the day prior to Bronchoscopy What changed:  additional instructions Notes to patient:  NOT given 10/02   aspirin EC 81 MG tablet Take 81 mg by mouth daily.   atenolol 25 MG tablet Commonly known as:  TENORMIN Take 25 mg by mouth 2 (two) times daily.   Black Cohosh 40 MG Caps Take 120 mg by mouth 2 (two) times daily. Notes to patient:  NOT given 10/02   cycloSPORINE 0.05 % ophthalmic emulsion Commonly known as:  RESTASIS Place 1 drop into both eyes 2 (two) times daily.   DEXILANT 60 MG capsule Generic drug:  dexlansoprazole Take 60 mg by mouth at bedtime.   docusate sodium 100 MG capsule Commonly known as:  COLACE Take 200 mg by mouth at bedtime.   FISH OIL PO Take 1,400 mg by mouth daily.   fluticasone 50 MCG/ACT nasal spray Commonly known as:  FLONASE Place 1 spray into both nostrils daily as needed for allergies or rhinitis.   Fluticasone-Salmeterol 250-50 MCG/DOSE Aepb Commonly known as:  ADVAIR Inhale 1 puff into the lungs 2 (two) times daily.   gabapentin 300 MG capsule Commonly known as:  NEURONTIN Take 600 mg by mouth at bedtime.   HYDROcodone-homatropine 5-1.5 MG/5ML syrup Commonly known as:  HYCODAN Take 5 mLs by mouth every 6 (six) hours as needed for cough.   isosorbide mononitrate 30 MG 24 hr tablet Commonly known as:  IMDUR Take 30 mg by mouth at bedtime.   losartan 100 MG tablet Commonly known as:  COZAAR Take 100 mg by mouth at bedtime.   lubiprostone 24 MCG capsule Commonly known as:  AMITIZA Take 24 mcg by mouth 2 (two) times daily with a meal.    metFORMIN 500 MG tablet Commonly known as:  GLUCOPHAGE Take 1,000 mg by mouth 2 (two) times daily with a meal.   nitroGLYCERIN 0.4 MG SL tablet Commonly known as:  NITROSTAT Place 0.4 mg under the tongue every 5 (five) minutes as needed for chest pain.   OXYGEN Inhale 2 L into the lungs as needed (shortness of breath/ low oxygen stats).   PARoxetine 20 MG tablet Commonly known as:  PAXIL Take 10 mg by mouth 2 (two) times daily.   sitaGLIPtin 100 MG tablet Commonly known as:  JANUVIA Take 100 mg by mouth daily with supper.   traMADol 50 MG tablet Commonly known as:  ULTRAM Take 1 tablet (50 mg total) by mouth every 6 (six) hours as needed (mild pain).   Vitamin D3 2000 units capsule Take 2,000 Units by mouth daily.   zolpidem 10 MG tablet Commonly known as:  AMBIEN Take 10 mg by mouth at bedtime.      Follow-up Information    Loreli Slot, MD Follow up on 05/26/2016.   Specialty:  Cardiothoracic Surgery Why:  Appointment is at 10:30, please get CXR at 10:00 [facility] on first floor of our office building Contact information: [address] [place] [address] [phone]           Signed: Lowella DandyBARRETT, ERIN 05/11/2016, 3:25 PM

## 2016-05-11 NOTE — Progress Notes (Signed)
Printed Tramadol script provided.  Patient out for d/c with spouse via wheelchair.

## 2016-05-11 NOTE — Care Management Note (Signed)
Case Management Note  Patient Details  Name: Hinda Lenisnnie L Knies MRN: [id number] Date of Birth: 03/04/1941  Subjective/Objective:  Patient is from home with spouse, pta indep, s/p  Vats, lung bx with bronch.  She has a PCP, has medication coverage and she has transportation at dc.  She is for dc today, no needs.                  Action/Plan:   Expected Discharge Date:                  Expected Discharge Plan:  Home/Self Care  In-House Referral:     Discharge planning Services  CM Consult  Post Acute Care Choice:    Choice offered to:     DME Arranged:    DME Agency:     HH Arranged:    HH Agency:     Status of Service:  Completed, signed off  If discussed at [employer] Length of Stay Meetings, dates discussed:    Additional Comments:  Leone Havenaylor, Heavenleigh Petruzzi Clinton, RN 05/11/2016, 10:09 AM

## 2016-05-11 NOTE — Progress Notes (Signed)
3 Days Post-Op Procedure(s) (LRB): VIDEO ASSISTED THORACOSCOPY (Right) LUNG BIOPSY (Right) VIDEO BRONCHOSCOPY (N/A) Subjective: No complaints  Objective: Vital signs in last 24 hours: Temp:  [98 F (36.7 C)-98.1 F (36.7 C)] 98.1 F (36.7 C) (10/01 2255) Pulse Rate:  [57-67] 66 (10/01 2255) Cardiac Rhythm: Sinus bradycardia (10/02 0729) Resp:  [19-26] 26 (10/01 2255) BP: (102-148)/(48-72) 141/72 (10/01 2255) SpO2:  [92 %-97 %] 94 % (10/01 2255)  Hemodynamic parameters for last 24 hours:    Intake/Output from previous day: 10/01 0701 - 10/02 0700 In: 600 [P.O.:600] Out: 450 [Urine:450] Intake/Output this shift: No intake/output data recorded.  General appearance: alert, cooperative and no distress Neurologic: intact Heart: regular rate and rhythm Lungs: rales bilaterally Wound: clean and dry  Lab Results:  Recent Labs  05/09/16 0452 05/10/16 0404  WBC 6.5 5.2  HGB 10.8* 10.5*  HCT 33.1* 32.5*  PLT 201 188   BMET:  Recent Labs  05/09/16 0452 05/10/16 0404  NA 138 137  K 3.8 3.9  CL 104 103  CO2 28 29  GLUCOSE 97 99  BUN 6 6  CREATININE 0.65 0.69  CALCIUM 8.7* 8.5*    PT/INR:  Recent Labs  05/08/16 1056  LABPROT 13.1  INR 0.99   ABG    Component Value Date/Time   PHART 7.345 (L) 05/09/2016 0422   HCO3 29.0 (H) 05/09/2016 0422   ACIDBASEDEF 0.3 05/06/2016 1351   O2SAT 92.7 05/09/2016 0422   CBG (last 3)   Recent Labs  05/10/16 1122 05/10/16 1721 05/10/16 2103  GLUCAP 113* 126* 139*    Assessment/Plan: S/P Procedure(s) (LRB): VIDEO ASSISTED THORACOSCOPY (Right) LUNG BIOPSY (Right) VIDEO BRONCHOSCOPY (N/A) Plan for discharge: see discharge orders  Doing well Home today Activity instructions given   LOS: 3 days    Loreli SlotSteven C Carel Carrier 05/11/2016

## 2016-05-14 NOTE — Anesthesia Postprocedure Evaluation (Signed)
Anesthesia Post Note  Patient: Stacy Contreras  Procedure(s) Performed: Procedure(s) (LRB): VIDEO ASSISTED THORACOSCOPY (Right) LUNG BIOPSY (Right) VIDEO BRONCHOSCOPY (N/A)  Patient location during evaluation: PACU Anesthesia Type: General Level of consciousness: awake Pain management: pain level controlled Vital Signs Assessment: post-procedure vital signs reviewed and stable Respiratory status: spontaneous breathing Cardiovascular status: stable Postop Assessment: no signs of nausea or vomiting Anesthetic complications: no    Last Vitals:  Vitals:   05/11/16 0810 05/11/16 1208  BP: 112/60 (!) 112/57  Pulse: (!) 58 (!) 58  Resp: 18 20  Temp: 37 C 36.5 C    Last Pain:  Vitals:   05/11/16 1208  TempSrc: Oral  PainSc:                  Alyana Kreiter

## 2016-05-15 ENCOUNTER — Encounter (HOSPITAL_COMMUNITY): Payer: Self-pay

## 2016-05-16 ENCOUNTER — Telehealth: Payer: Self-pay | Admitting: Internal Medicine

## 2016-05-16 NOTE — Telephone Encounter (Signed)
Elise  Pls give AM slot (not PM) to see Hinda LenisAnnie L Albin to discuss lung bx result. How soon can you get her in ?  Thanks  Dr. Kalman ShanMurali Tysen Roesler, M.D., [facility].C.P Pulmonary and Critical Care Medicine Staff Physician [facility] [place] [facility] Pager: [phone], If no answer or between  15:00h - 7:00h: call [phone]  05/16/2016 9:36 AM

## 2016-05-18 NOTE — Telephone Encounter (Signed)
Called and spoke to pt. Appt made with MR on 05/19/16 at 9:30. Pt verbalized understanding and denied any further questions or concerns at this time.

## 2016-05-18 NOTE — Telephone Encounter (Signed)
Yes ok. I have to discuss referral [facility]  THanks  Dr. Kalman ShanMurali Indigo Chaddock, M.D., [facility].C.C.P Pulmonary and Critical Care Medicine Staff Physician [place] [place] [facility] Pager: [phone], If no answer or between  15:00h - 7:00h: call [phone]  05/18/2016 12:43 PM

## 2016-05-18 NOTE — Telephone Encounter (Signed)
Pt can be scheduled as early as 05/19/2016 at 9:30 (15 min slot).   This ok to book?

## 2016-05-19 ENCOUNTER — Ambulatory Visit (INDEPENDENT_AMBULATORY_CARE_PROVIDER_SITE_OTHER): Payer: Medicare Other | Admitting: Internal Medicine

## 2016-05-19 ENCOUNTER — Encounter: Payer: Self-pay | Admitting: Internal Medicine

## 2016-05-19 VITALS — BP 110/62 | HR 63 | Ht 65.0 in | Wt 144.0 lb

## 2016-05-19 DIAGNOSIS — I2510 Atherosclerotic heart disease of native coronary artery without angina pectoris: Secondary | ICD-10-CM

## 2016-05-19 DIAGNOSIS — J849 Interstitial pulmonary disease, unspecified: Secondary | ICD-10-CM

## 2016-05-19 NOTE — Patient Instructions (Signed)
ICD-9-CM ICD-10-CM   1. ILD (interstitial lung disease) (HCC) 515 J84.9     Type of ILD from biopsy 05/08/16 is unclassifable So, I do not know what is causing this and what the outlook is and what the best treatment course  PLAN  - refer [facility] - I will also ask the reseearch company [employer] if their pathologist can give us diagnosis  Followup 2-3 months or sooner if needed

## 2016-05-19 NOTE — Progress Notes (Signed)
Subjective:     Patient ID: Stacy Contreras, female   DOB: 1941-06-15, 75 y.o.   MRN: [id number]  HPI    Follow-up interstitial lung disease  Status post surgical lung biopsy 05/08/2016 on the right side. Biopsies were sent to Dr. Mearl Latin at [place] who personally visualized the slides. 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 Reports says it is a non-UIP pattern. Patient is here with her husband. I discussed these results to her. She is healing from her surgery and is due to see the surgeon next week.  Of note she did participate in [facility] - ILD diagnostic study    has a past medical history of Arthritis; Asthma; Coronary artery disease; Diabetes mellitus without complication (HCC); DVT (deep venous thrombosis) (HCC); GERD (gastroesophageal reflux disease); History of breast cancer; Hypertension; Interstitial lung disease (HCC); Kidney stones; and Shortness of breath dyspnea.   reports that she has never smoked. She has never used smokeless tobacco.  Past Surgical History:  Procedure Laterality Date  . APPENDECTOMY    . CARDIAC CATHETERIZATION    . CHOLECYSTECTOMY    . COLONOSCOPY    . CORONARY ANGIOPLASTY    . ESOPHAGOGASTRODUODENOSCOPY    . EYE SURGERY     cataracts  . hysterectomy    . left knee replacement    . LUNG BIOPSY Right 05/08/2016   Procedure: LUNG BIOPSY;  Surgeon: Loreli Slot, MD;  Location: [facility] OR;  Service: Thoracic;  Laterality: Right;  . right breast lumpectomy    . stents x 2    . VIDEO ASSISTED THORACOSCOPY Right 05/08/2016   Procedure: VIDEO ASSISTED THORACOSCOPY;  Surgeon: Loreli Slot, MD;  Location: [facility] OR;  Service: Thoracic;  Laterality: Right;  Marland Kitchen VIDEO BRONCHOSCOPY Bilateral 12/19/2015   Procedure: VIDEO BRONCHOSCOPY WITHOUT FLUORO;  Surgeon: Kalman Shan, MD;  Location: [facility] ENDOSCOPY;  Service: Endoscopy;  Laterality: Bilateral;  . VIDEO  BRONCHOSCOPY N/A 05/08/2016   Procedure: VIDEO BRONCHOSCOPY;  Surgeon: Loreli Slot, MD;  Location[facility] OR;  Service: Thoracic;  Laterality: N/A;    Allergies  Allergen Reactions  . Codeine Other (See Comments)    Fast heart beat (tolerates hydrocodone)  . Statins Other (See Comments)    Muscle weakness  . Sulfa Antibiotics Rash    Immunization History  Administered Date(s) Administered  . Influenza, High Dose Seasonal PF 04/29/2016  . Influenza-Unspecified 05/11/2015  . Pneumococcal Polysaccharide-23 08/10/2010    Family History  Problem Relation Age of Onset  . Lung disease Mother     ? brown lung  . Colon cancer Father   . Breast cancer Sister   . Breast cancer Daughter      Current Outpatient Prescriptions:  .  acetaminophen (TYLENOL) 500 MG tablet, Take 1,000 mg by mouth 2 (two) times daily as needed for headache (pain)., Disp: , Rfl:  .  albuterol (PROVENTIL HFA;VENTOLIN HFA) 108 (90 Base) MCG/ACT inhaler, Inhale 2 puffs into the lungs every 4 (four) hours as needed for wheezing or shortness of breath., Disp: , Rfl:  .  amLODipine (NORVASC) 5 MG tablet, Take 5 mg by mouth at bedtime. , Disp: , Rfl:  .  apixaban (ELIQUIS) 5 MG TABS tablet, Take 0.5 tablets (2.5 mg total) by mouth 2 (two) times daily. Hold on Eliquis the day prior to Bronchoscopy (Patient taking differently: Take 2.5 mg by mouth 2 (two) times daily. ), Disp: 60 tablet, Rfl:  .  aspirin EC 81 MG tablet, Take 81 mg by mouth daily., Disp: , Rfl:  .  atenolol (TENORMIN) 25 MG tablet, Take 25 mg by mouth 2 (two) times daily., Disp: , Rfl:  .  Black Cohosh 40 MG CAPS, Take 120 mg by mouth 2 (two) times daily., Disp: , Rfl:  .  Cholecalciferol (VITAMIN D3) 2000 units capsule, Take 2,000 Units by mouth daily., Disp: , Rfl:  .  cycloSPORINE (RESTASIS) 0.05 % ophthalmic emulsion, Place 1 drop into both eyes 2 (two) times daily., Disp: , Rfl:  .  dexlansoprazole (DEXILANT) 60 MG capsule, Take 60 mg by mouth at  bedtime. , Disp: , Rfl:  .  docusate sodium (COLACE) 100 MG capsule, Take 200 mg by mouth at bedtime. , Disp: , Rfl:  .  fluticasone (FLONASE) 50 MCG/ACT nasal spray, Place 1 spray into both nostrils daily as needed for allergies or rhinitis., Disp: , Rfl:  .  Fluticasone-Salmeterol (ADVAIR) 250-50 MCG/DOSE AEPB, Inhale 1 puff into the lungs 2 (two) times daily., Disp: , Rfl:  .  gabapentin (NEURONTIN) 300 MG capsule, Take 600 mg by mouth at bedtime. , Disp: , Rfl:  .  HYDROcodone-homatropine (HYCODAN) 5-1.5 MG/5ML syrup, Take 5 mLs by mouth every 6 (six) hours as needed for cough., Disp: 120 mL, Rfl: 0 .  isosorbide mononitrate (IMDUR) 30 MG 24 hr tablet, Take 30 mg by mouth at bedtime. , Disp: , Rfl:  .  losartan (COZAAR) 100 MG tablet, Take 100 mg by mouth at bedtime., Disp: , Rfl:  .  lubiprostone (AMITIZA) 24 MCG capsule, Take 24 mcg by mouth 2 (two) times daily with a meal., Disp: , Rfl:  .  metFORMIN (GLUCOPHAGE) 500 MG tablet, Take 1,000 mg by mouth 2 (two) times daily with a meal., Disp: , Rfl:  .  nitroGLYCERIN (NITROSTAT) 0.4 MG SL tablet, Place 0.4 mg under the tongue every 5 (five) minutes as needed for chest pain., Disp: , Rfl:  .  Omega-3 Fatty Acids (FISH OIL PO), Take 1,400 mg by mouth daily., Disp: , Rfl:  .  OXYGEN, Inhale 2 L into the lungs as needed (shortness of breath/ low oxygen stats)., Disp: , Rfl:  .  PARoxetine (PAXIL) 20 MG tablet, Take 10 mg by mouth 2 (two) times daily. , Disp: , Rfl:  .  sitaGLIPtin (JANUVIA) 100 MG tablet, Take 100 mg by mouth daily with supper. , Disp: , Rfl:  .  traMADol (ULTRAM) 50 MG tablet, Take 1 tablet (50 mg total) by mouth every 6 (six) hours as needed (mild pain)., Disp: 30 tablet, Rfl: 0 .  zolpidem (AMBIEN) 10 MG tablet, Take 10 mg by mouth at bedtime. , Disp: , Rfl:      Review of Systems     Objective:   Physical Exam Vitals:   05/19/16 1002 05/19/16 1003  BP:  110/62  Pulse:  63  SpO2:  93%  Weight: 144 lb (65.3 kg)    Height: 5\' 5"  (1.651 m)     Discussion only visit    Assessment:       ICD-9-CM ICD-10-CM   1. ILD (interstitial lung disease) (HCC) 515 J84.9 Ambulatory referral to Pulmonology       Plan:     Fortunately or unfortunately it is a non-classifiable fibrosis. I personally have not seen her description before in any  of my ILD patient's pathology. Obviously it is uncommon. I do not know the cause of the outlook for the treatment course to  take. I do not want to necessarily committed attempted steroids. We discussed second opinion which I highly recommended. We discussed options of going to [facility] interstitial lung disease clinic versus Dr. Alfonse Spruce at [facility] versus [facility] [place]. We opted and agreed that best to start with [facility]  [facility] clinic Dr. Leona Singleton Morrison's group  As soon as patient let's me know her appointment date I will email the group   (> 50% of this 15 min visit spent in face to face counseling or/and coordination of care)   Dr. Kalman Shan, M.D., [facility].C.P Pulmonary and Critical Care Medicine Staff Physician [facility] [employer] Pulmonary and Critical Care Pager: [phone], If no answer or between  15:00h - 7:00h: call [phone]  05/19/2016 10:43 AM

## 2016-05-20 ENCOUNTER — Telehealth: Payer: Self-pay | Admitting: Internal Medicine

## 2016-05-20 NOTE — Telephone Encounter (Signed)
Pt's appt with [facility] has been scheduled on 11/27, with a pft at 8:00 and will see Dr Ileene Hutchinsonackley at 9:00.   Will send to MR as FYI.

## 2016-05-25 ENCOUNTER — Other Ambulatory Visit: Payer: Self-pay | Admitting: Thoracic Surgery (Cardiothoracic Vascular Surgery)

## 2016-05-25 DIAGNOSIS — J849 Interstitial pulmonary disease, unspecified: Secondary | ICD-10-CM

## 2016-05-26 ENCOUNTER — Ambulatory Visit
Admission: RE | Admit: 2016-05-26 | Discharge: 2016-05-26 | Disposition: A | Discharge status: Home / Self Care | Payer: Medicare Other | Source: Ambulatory Visit | Attending: Thoracic Surgery (Cardiothoracic Vascular Surgery) | Admitting: Thoracic Surgery (Cardiothoracic Vascular Surgery)

## 2016-05-26 ENCOUNTER — Ambulatory Visit (INDEPENDENT_AMBULATORY_CARE_PROVIDER_SITE_OTHER): Payer: Self-pay | Admitting: Thoracic Surgery (Cardiothoracic Vascular Surgery)

## 2016-05-26 VITALS — BP 114/64 | HR 67 | Resp 20 | Ht 65.0 in | Wt 145.0 lb

## 2016-05-26 DIAGNOSIS — J849 Interstitial pulmonary disease, unspecified: Secondary | ICD-10-CM

## 2016-05-26 NOTE — Progress Notes (Signed)
[address]       [place] [address]             [phone]       HPI: Mrs. Krouse returns today for a scheduled postoperative follow-up.  She is a 75 year old woman with interstitial lung disease. She had a right VATS lung biopsy on 05/08/2016. Her postoperative course was unremarkable and she went home on day 3. Her pathology showed an unclassifiable pulmonary fibrosis.   She denies pain. She only took a few pain pills when she first got home. Her breathing is unchanged from her preoperative status. She is anxious to increase her activities.  Past Medical History:  Diagnosis Date  . Arthritis   . Asthma   . Coronary artery disease   . Diabetes mellitus without complication (HCC)   . DVT (deep venous thrombosis) (HCC)   . GERD (gastroesophageal reflux disease)   . History of breast cancer   . Hypertension   . Interstitial lung disease (HCC)   . Kidney stones   . Shortness of breath dyspnea      Current Outpatient Prescriptions  Medication Sig Dispense Refill  . acetaminophen (TYLENOL) 500 MG tablet Take 1,000 mg by mouth 2 (two) times daily as needed for headache (pain).    Marland Kitchen albuterol (PROVENTIL HFA;VENTOLIN HFA) 108 (90 Base) MCG/ACT inhaler Inhale 2 puffs into the lungs every 4 (four) hours as needed for wheezing or shortness of breath.    Marland Kitchen amLODipine (NORVASC) 5 MG tablet Take 5 mg by mouth at bedtime.     Marland Kitchen apixaban (ELIQUIS) 5 MG TABS tablet Take 0.5 tablets (2.5 mg total) by mouth 2 (two) times daily. Hold on Eliquis the day prior to Bronchoscopy (Patient taking differently: Take 2.5 mg by mouth 2 (two) times daily. ) 60 tablet   . aspirin EC 81 MG tablet Take 81 mg by mouth daily.    Marland Kitchen atenolol (TENORMIN) 25 MG tablet Take 25 mg by mouth 2 (two) times daily.    . Black Cohosh 40 MG CAPS Take 120 mg by mouth 2 (two) times daily.    . Cholecalciferol (VITAMIN D3) 2000 units capsule Take 2,000 Units by mouth daily.    . cycloSPORINE (RESTASIS)  0.05 % ophthalmic emulsion Place 1 drop into both eyes 2 (two) times daily.    Marland Kitchen dexlansoprazole (DEXILANT) 60 MG capsule Take 60 mg by mouth at bedtime.     . docusate sodium (COLACE) 100 MG capsule Take 200 mg by mouth at bedtime.     . fluticasone (FLONASE) 50 MCG/ACT nasal spray Place 1 spray into both nostrils daily as needed for allergies or rhinitis.    . Fluticasone-Salmeterol (ADVAIR) 250-50 MCG/DOSE AEPB Inhale 1 puff into the lungs 2 (two) times daily.    Marland Kitchen gabapentin (NEURONTIN) 300 MG capsule Take 600 mg by mouth at bedtime.     . isosorbide mononitrate (IMDUR) 30 MG 24 hr tablet Take 30 mg by mouth at bedtime.     Marland Kitchen losartan (COZAAR) 100 MG tablet Take 100 mg by mouth at bedtime.    Marland Kitchen lubiprostone (AMITIZA) 24 MCG capsule Take 24 mcg by mouth 2 (two) times daily with a meal.    . metFORMIN (GLUCOPHAGE) 500 MG tablet Take 1,000 mg by mouth 2 (two) times daily with a meal.    . nitroGLYCERIN (NITROSTAT) 0.4 MG SL tablet Place 0.4 mg under the tongue every 5 (five) minutes as needed for chest pain.    Marland Kitchen  Omega-3 Fatty Acids (FISH OIL PO) Take 1,400 mg by mouth daily.    . OXYGEN Inhale 2 L into the lungs as needed (shortness of breath/ low oxygen stats).    Marland Kitchen. PARoxetine (PAXIL) 20 MG tablet Take 10 mg by mouth 2 (two) times daily.     . sitaGLIPtin (JANUVIA) 100 MG tablet Take 100 mg by mouth daily with supper.     . zolpidem (AMBIEN) 10 MG tablet Take 10 mg by mouth at bedtime.     Marland Kitchen. HYDROcodone-homatropine (HYCODAN) 5-1.5 MG/5ML syrup Take 5 mLs by mouth every 6 (six) hours as needed for cough. (Patient not taking: Reported on 05/26/2016) 120 mL 0  . traMADol (ULTRAM) 50 MG tablet Take 1 tablet (50 mg total) by mouth every 6 (six) hours as needed (mild pain). (Patient not taking: Reported on 05/26/2016) 30 tablet 0   No current facility-administered medications for this visit.     Physical Exam BP 114/64 (BP Location: Left Arm, Patient Position: Sitting, Cuff Size: Small)   Pulse  67   Resp 20   Ht 5\' 5"  (1.651 m)   Wt 145 lb (65.8 kg)   SpO2 97% Comment: RA  BMI 24.7313 kg/m  75 year old woman in no acute distress Alert and oriented 3 with no focal deficits Incisions well healed Lungs faint crackles at bases, equal bilaterally  Diagnostic Tests:  I personally reviewed her chest x-ray. It shows postoperative changes. There is no pneumothorax.  Impression: 75 year old woman who had a thoracoscopic lung biopsy about 2-1/2 weeks ago. She's doing extremely well at this point in time. She's having minimal discomfort and has not had any adverse effect on her respiratory capacity.  I think she's fine to begin driving. Appropriate precautions for the next couple of weeks were discussed. She should avoid submerging her incision in water, but otherwise her activities are unrestricted.  [facility] have any ideas about treatment for the unclassifiable fibrosis.  Plan: I will be happy to see her back any time in the future if I can be of any further assistance with her care  Loreli SlotSteven C Kayode Petion, MD [facility] [phone]

## 2016-06-22 NOTE — Telephone Encounter (Signed)
Called and spoke to pt. Informed her of the recs per MR. Pt verbalized understanding and denied any further questions or concerns at this time.   

## 2016-06-22 NOTE — Telephone Encounter (Signed)
Let Hinda LenisAnnie L Knauff know that I d/w Dr Rackle11/13/2017 10:18 AM ahead of her visit to see him  Thanks  Dr. Kalman ShanMurali Bianca Vester, M.D., [facility].C.P Pulmonary and Critical Care Medicine Staff Physician [facility] [place] Pulmonary and Critical Care Pager: [phone], If no answer or between  15:00h - 7:00h: call [phone]  06/22/2016 10:18 AM

## 2016-08-06 ENCOUNTER — Telehealth: Payer: Self-pay | Admitting: Internal Medicine

## 2016-08-06 NOTE — Telephone Encounter (Signed)
Our office called to reschedule pt's upcoming appointment with MR for 08/24/16.  I spoke with Pt, who  wanted to know if this apt was necessary, as she has been referred to [facility].  MR please advise. Thanks.

## 2016-08-12 NOTE — Telephone Encounter (Signed)
Patient returning phone call 

## 2016-08-12 NOTE — Telephone Encounter (Signed)
Attempted to contact pt. No answer, no option to leave a message. Will try back.  

## 2016-08-12 NOTE — Telephone Encounter (Signed)
Did she see the doc at [facility] around thanksgiving 2017?  Review of [facility] everywhere I dp not see that she has seen him. Regardless she can cancel the Jan 2018 appt - give her one in April 2018 and dependng on [facility] visit and plan she can cancel that a week before in a similar fashion by calling and checking with us  Thanks  Dr. Kalman ShanMurali Zahra Peffley, M.D., [facility].C.P Pulmonary and Critical Care Medicine Staff Physician [facility] [place] Pulmonary and Critical Care Pager: [phone], If no answer or between  15:00h - 7:00h: call [phone]  08/12/2016 8:55 AM

## 2016-08-12 NOTE — Telephone Encounter (Signed)
lmtcb X2 for pt.  

## 2016-08-12 NOTE — Telephone Encounter (Signed)
lmomtcb x1 

## 2016-08-13 NOTE — Telephone Encounter (Signed)
[phone] pt calling back

## 2016-08-13 NOTE — Telephone Encounter (Signed)
lmtcb x1 for pt. 

## 2016-08-13 NOTE — Telephone Encounter (Signed)
Spoke with pt, aware of MR's recs.  States she will call back as needed for appts as she is in Dr. Mercie EonGilstrap's care.  appt cancelled at pt request.  Nothing further needed.

## 2016-08-24 ENCOUNTER — Ambulatory Visit: Payer: Medicare Other | Admitting: Internal Medicine

## 2017-07-23 IMAGING — CR DG CHEST 2V
2 series · 2 of 2 positions shown · non-contrast
Comparison: CT chest [DATE]

CLINICAL DATA: Preop for right lung biopsy

EXAM:
CHEST  2 VIEW

[w chest pa]
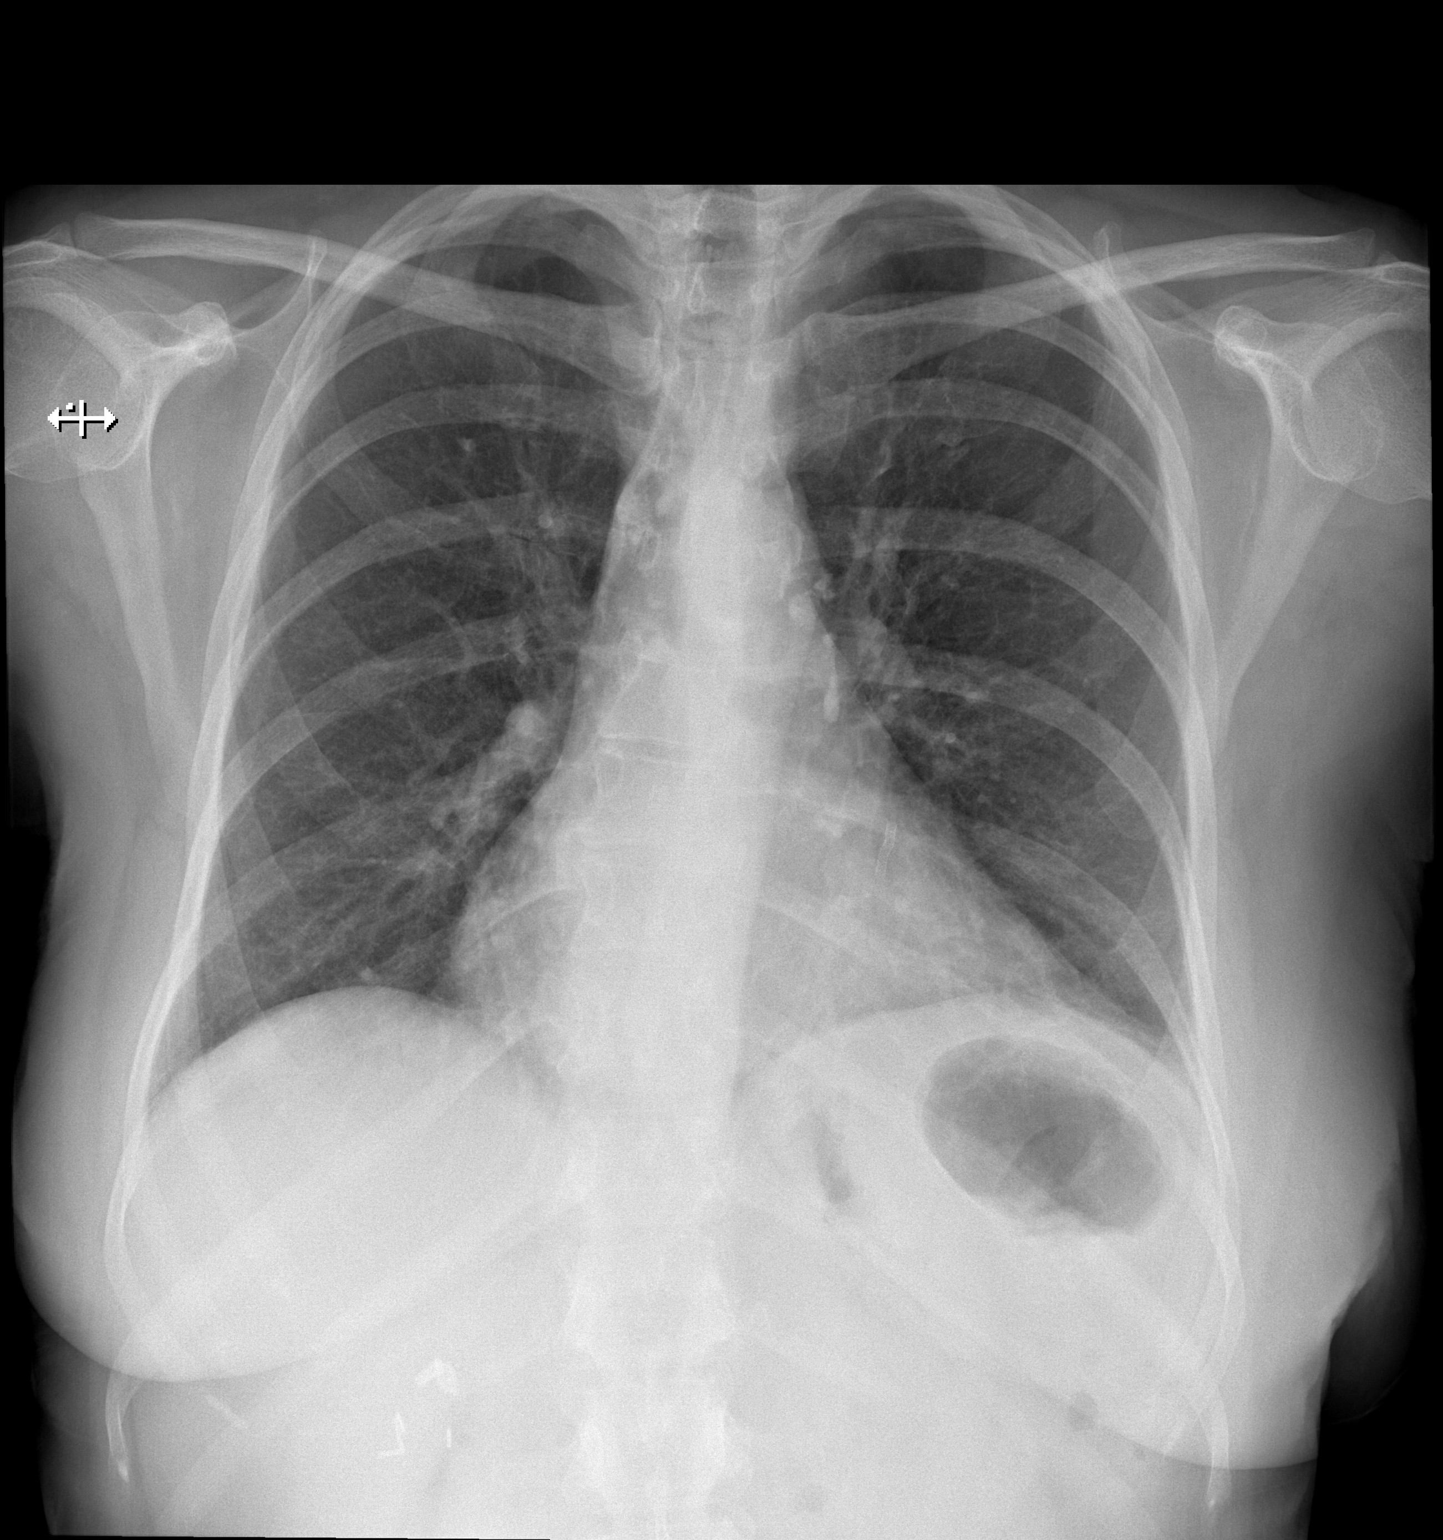

[w chest lat]
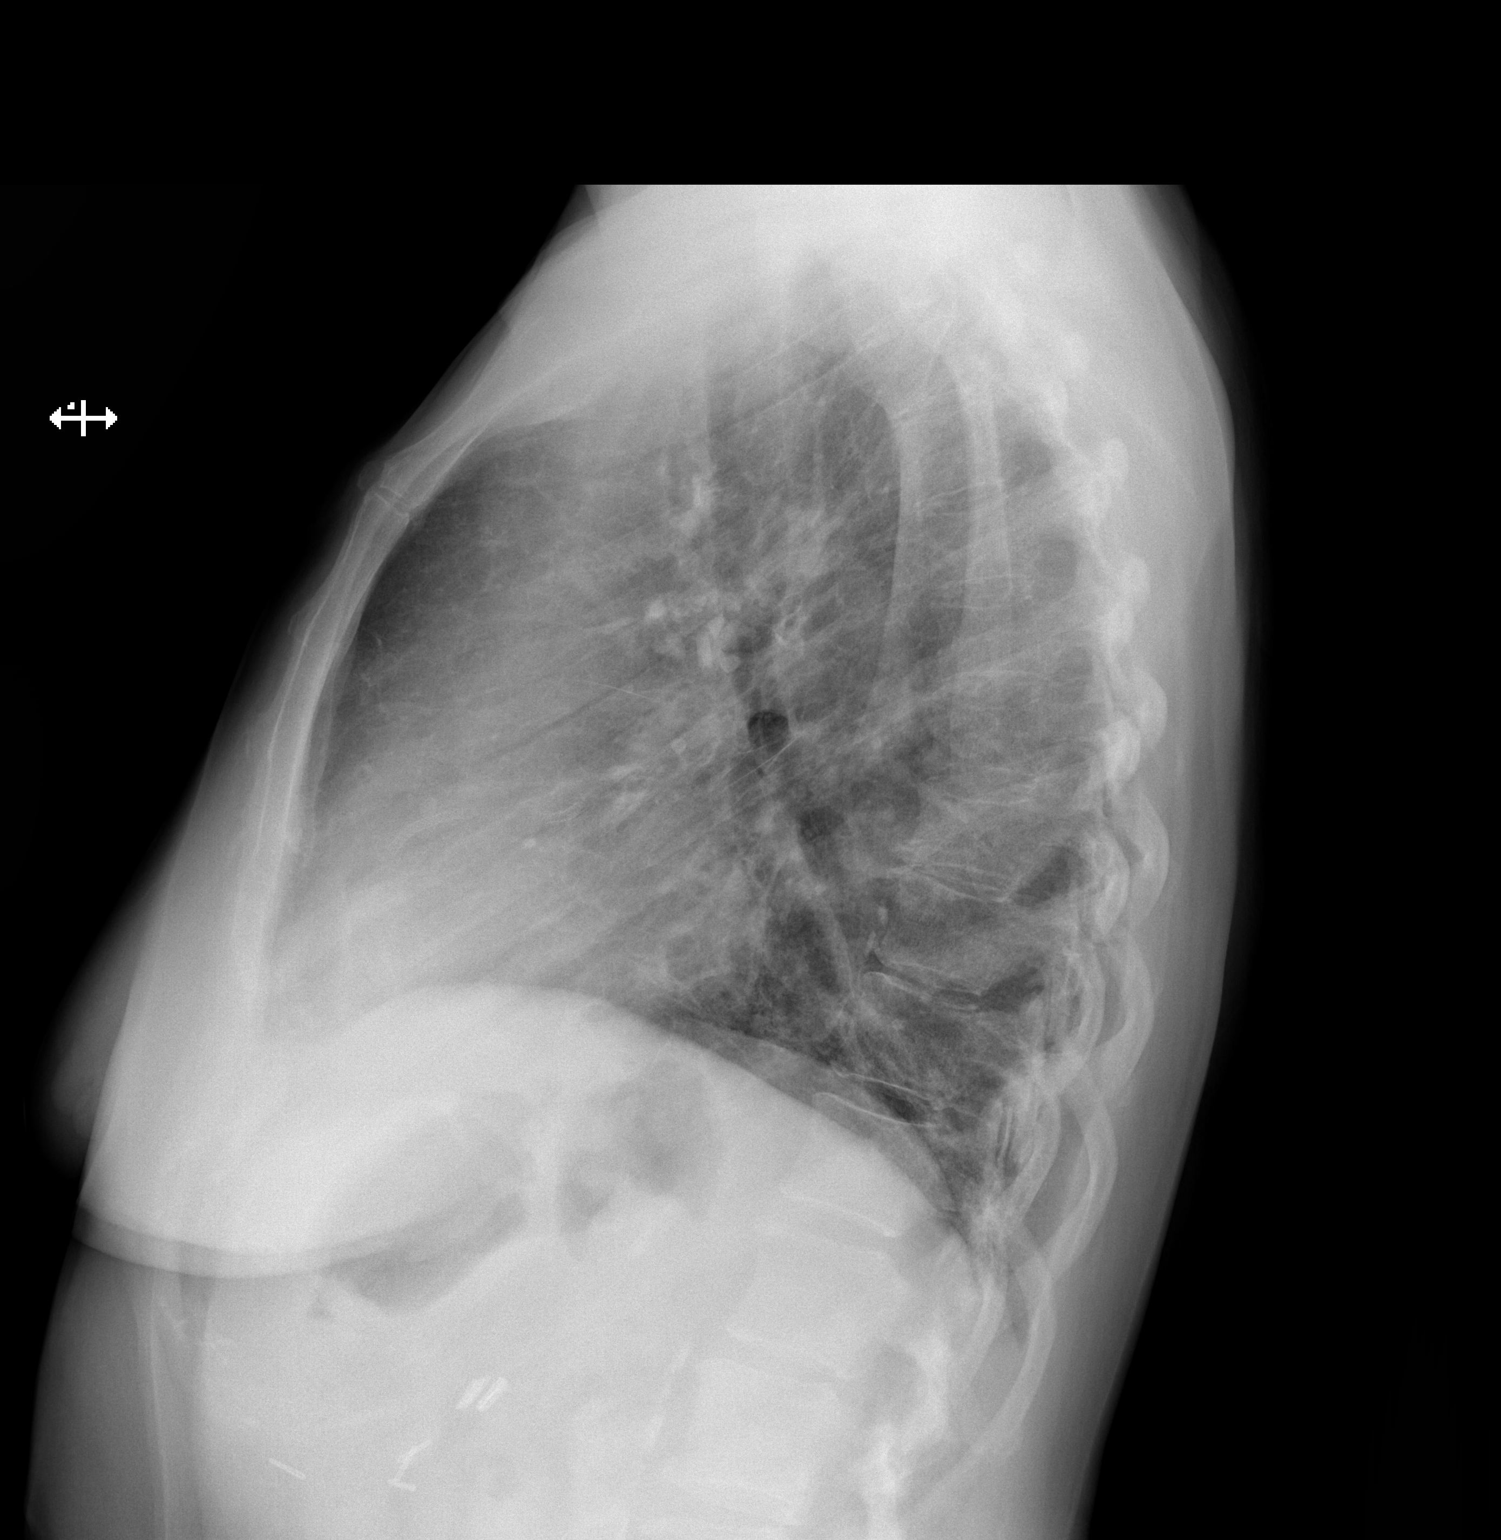

[2 of 2 positions shown; findings below may reference images not displayed]

FINDINGS: Cardiomediastinal silhouette is unremarkable. No acute infiltrate or
pleural effusion. No pulmonary edema. Mild degenerative changes mid
thoracic spine.
IMPRESSION: No active cardiopulmonary disease.

## 2017-07-25 IMAGING — CR DG CHEST 1V PORT
1 series · 1 of 1 positions shown · non-contrast
Comparison: 05/06/2016 and earlier.

CLINICAL DATA: 75-year-old female status post VATS. Initial
encounter.

EXAM:
PORTABLE CHEST 1 VIEW

[AP]
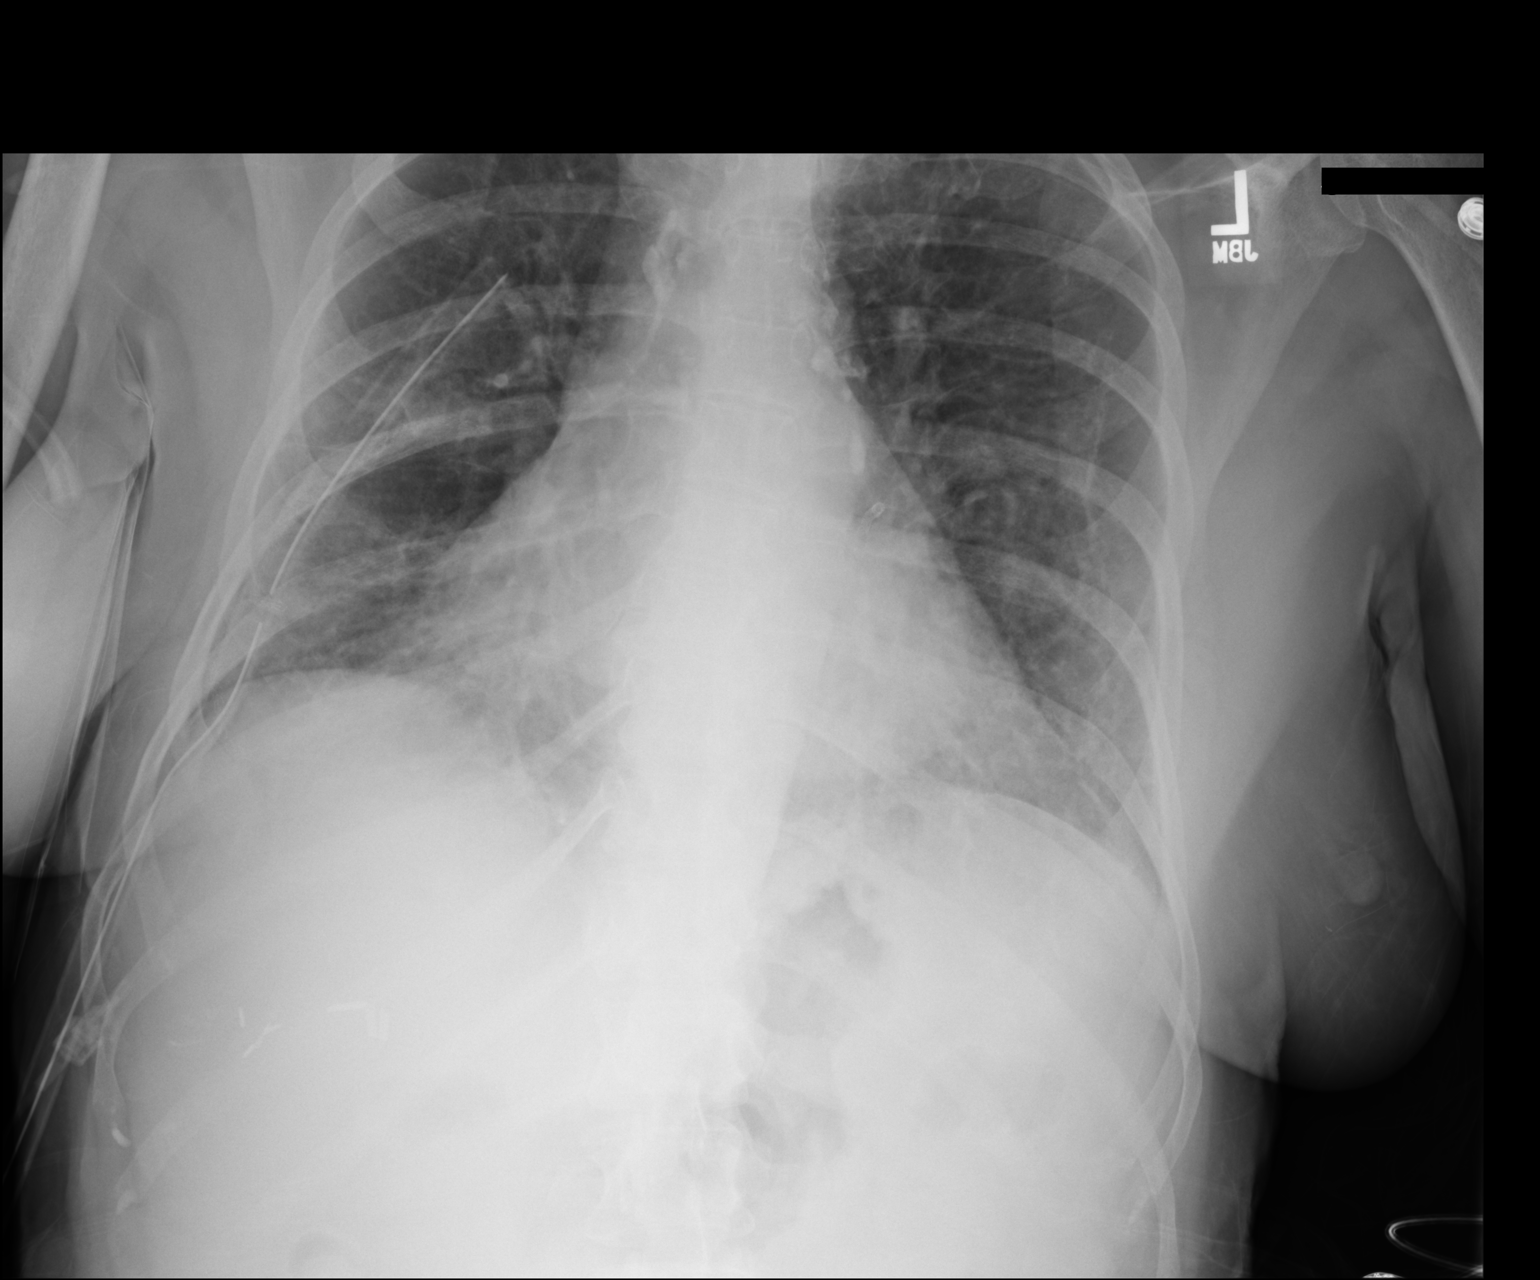

[1 of 1 positions shown; findings below may reference images not displayed]

FINDINGS: Portable AP semi upright view at at 6262 hours. Right chest tube now
in place. No pneumothorax identified. Slightly lower lung volumes.
Mild increased interstitial opacity diffusely. Small calcified
mediastinal lymph nodes again noted. Stable cardiac size and
mediastinal contours. No confluent pulmonary opacity identified.
Stable right upper quadrant surgical clips.
IMPRESSION: 1. Right chest tube in place with no pneumothorax identified.
2. Pulmonary vascular congestion and/or crowding of lung markings.
No overt edema or confluent pulmonary opacity.

## 2019-08-11 DIAGNOSIS — U071 COVID-19: Secondary | ICD-10-CM

## 2019-08-11 HISTORY — DX: COVID-19: U07.1

## 2020-04-23 ENCOUNTER — Emergency Department (HOSPITAL_COMMUNITY): Payer: Medicare Other

## 2020-04-23 ENCOUNTER — Encounter (HOSPITAL_COMMUNITY): Payer: Self-pay | Admitting: Emergency Medicine

## 2020-04-23 ENCOUNTER — Other Ambulatory Visit: Payer: Self-pay

## 2020-04-23 ENCOUNTER — Inpatient Hospital Stay (HOSPITAL_COMMUNITY)
Admission: EM | Admit: 2020-04-23 | Discharge: 2020-04-29 | DRG: 177 | Disposition: A | Discharge status: Home Health | Payer: Medicare Other | Attending: Internal Medicine | Admitting: Internal Medicine

## 2020-04-23 DIAGNOSIS — Z7984 Long term (current) use of oral hypoglycemic drugs: Secondary | ICD-10-CM

## 2020-04-23 DIAGNOSIS — Z9981 Dependence on supplemental oxygen: Secondary | ICD-10-CM

## 2020-04-23 DIAGNOSIS — F419 Anxiety disorder, unspecified: Secondary | ICD-10-CM | POA: Diagnosis present

## 2020-04-23 DIAGNOSIS — J849 Interstitial pulmonary disease, unspecified: Secondary | ICD-10-CM | POA: Diagnosis present

## 2020-04-23 DIAGNOSIS — T380X5A Adverse effect of glucocorticoids and synthetic analogues, initial encounter: Secondary | ICD-10-CM | POA: Diagnosis not present

## 2020-04-23 DIAGNOSIS — I2510 Atherosclerotic heart disease of native coronary artery without angina pectoris: Secondary | ICD-10-CM | POA: Diagnosis present

## 2020-04-23 DIAGNOSIS — E119 Type 2 diabetes mellitus without complications: Secondary | ICD-10-CM

## 2020-04-23 DIAGNOSIS — J1282 Pneumonia due to coronavirus disease 2019: Secondary | ICD-10-CM | POA: Diagnosis present

## 2020-04-23 DIAGNOSIS — Z7982 Long term (current) use of aspirin: Secondary | ICD-10-CM

## 2020-04-23 DIAGNOSIS — Z955 Presence of coronary angioplasty implant and graft: Secondary | ICD-10-CM

## 2020-04-23 DIAGNOSIS — J80 Acute respiratory distress syndrome: Secondary | ICD-10-CM | POA: Diagnosis present

## 2020-04-23 DIAGNOSIS — R0602 Shortness of breath: Secondary | ICD-10-CM | POA: Diagnosis present

## 2020-04-23 DIAGNOSIS — F329 Major depressive disorder, single episode, unspecified: Secondary | ICD-10-CM | POA: Diagnosis present

## 2020-04-23 DIAGNOSIS — Z96652 Presence of left artificial knee joint: Secondary | ICD-10-CM | POA: Diagnosis present

## 2020-04-23 DIAGNOSIS — Z7901 Long term (current) use of anticoagulants: Secondary | ICD-10-CM

## 2020-04-23 DIAGNOSIS — S8002XA Contusion of left knee, initial encounter: Secondary | ICD-10-CM | POA: Diagnosis present

## 2020-04-23 DIAGNOSIS — Z800 Family history of malignant neoplasm of digestive organs: Secondary | ICD-10-CM

## 2020-04-23 DIAGNOSIS — I10 Essential (primary) hypertension: Secondary | ICD-10-CM | POA: Diagnosis present

## 2020-04-23 DIAGNOSIS — Z87442 Personal history of urinary calculi: Secondary | ICD-10-CM

## 2020-04-23 DIAGNOSIS — Z86711 Personal history of pulmonary embolism: Secondary | ICD-10-CM

## 2020-04-23 DIAGNOSIS — E1165 Type 2 diabetes mellitus with hyperglycemia: Secondary | ICD-10-CM | POA: Diagnosis not present

## 2020-04-23 DIAGNOSIS — J45909 Unspecified asthma, uncomplicated: Secondary | ICD-10-CM | POA: Diagnosis present

## 2020-04-23 DIAGNOSIS — U071 COVID-19: Secondary | ICD-10-CM | POA: Diagnosis not present

## 2020-04-23 DIAGNOSIS — I82409 Acute embolism and thrombosis of unspecified deep veins of unspecified lower extremity: Secondary | ICD-10-CM | POA: Diagnosis present

## 2020-04-23 DIAGNOSIS — S51811A Laceration without foreign body of right forearm, initial encounter: Secondary | ICD-10-CM | POA: Diagnosis present

## 2020-04-23 DIAGNOSIS — W19XXXA Unspecified fall, initial encounter: Secondary | ICD-10-CM | POA: Diagnosis present

## 2020-04-23 DIAGNOSIS — Z853 Personal history of malignant neoplasm of breast: Secondary | ICD-10-CM

## 2020-04-23 DIAGNOSIS — K219 Gastro-esophageal reflux disease without esophagitis: Secondary | ICD-10-CM | POA: Diagnosis present

## 2020-04-23 DIAGNOSIS — I424 Endocardial fibroelastosis: Secondary | ICD-10-CM | POA: Diagnosis present

## 2020-04-23 DIAGNOSIS — Z90710 Acquired absence of both cervix and uterus: Secondary | ICD-10-CM

## 2020-04-23 DIAGNOSIS — R0902 Hypoxemia: Secondary | ICD-10-CM

## 2020-04-23 DIAGNOSIS — Z803 Family history of malignant neoplasm of breast: Secondary | ICD-10-CM

## 2020-04-23 DIAGNOSIS — Z888 Allergy status to other drugs, medicaments and biological substances status: Secondary | ICD-10-CM

## 2020-04-23 DIAGNOSIS — Z885 Allergy status to narcotic agent status: Secondary | ICD-10-CM

## 2020-04-23 DIAGNOSIS — S61511A Laceration without foreign body of right wrist, initial encounter: Secondary | ICD-10-CM | POA: Diagnosis present

## 2020-04-23 DIAGNOSIS — Z882 Allergy status to sulfonamides status: Secondary | ICD-10-CM

## 2020-04-23 DIAGNOSIS — Z86718 Personal history of other venous thrombosis and embolism: Secondary | ICD-10-CM

## 2020-04-23 DIAGNOSIS — E1142 Type 2 diabetes mellitus with diabetic polyneuropathy: Secondary | ICD-10-CM | POA: Diagnosis present

## 2020-04-23 DIAGNOSIS — Z79899 Other long term (current) drug therapy: Secondary | ICD-10-CM

## 2020-04-23 LAB — COMPREHENSIVE METABOLIC PANEL
ALT: 25 U/L (ref 0–44)
AST: 28 U/L (ref 15–41)
Albumin: 3.6 g/dL (ref 3.5–5.0)
Alkaline Phosphatase: 49 U/L (ref 38–126)
Anion gap: 9 (ref 5–15)
BUN: 11 mg/dL (ref 8–23)
CO2: 24 mmol/L (ref 22–32)
Calcium: 8.5 mg/dL — ABNORMAL LOW (ref 8.9–10.3)
Chloride: 98 mmol/L (ref 98–111)
Creatinine, Ser: 0.99 mg/dL (ref 0.44–1.00)
GFR calc Af Amer: >60 mL/min (ref >60)
GFR calc non Af Amer: 55 mL/min — ABNORMAL LOW (ref >60)
Glucose, Bld: 216 mg/dL — ABNORMAL HIGH (ref 70–99)
Potassium: 4.2 mmol/L (ref 3.5–5.1)
Sodium: 131 mmol/L — ABNORMAL LOW (ref 135–145)
Total Bilirubin: 0.6 mg/dL (ref 0.3–1.2)
Total Protein: 7.1 g/dL (ref 6.5–8.1)

## 2020-04-23 LAB — LACTIC ACID, PLASMA: Lactic Acid, Venous: 1.4 mmol/L (ref 0.5–1.9)

## 2020-04-23 LAB — CBC WITH DIFFERENTIAL/PLATELET
Abs Immature Granulocytes: 0.03 10*3/uL (ref 0.00–0.07)
Basophils Absolute: 0 10*3/uL (ref 0.0–0.1)
Basophils Relative: 0 %
Eosinophils Absolute: 0 10*3/uL (ref 0.0–0.5)
Eosinophils Relative: 0 %
HCT: 36.9 % (ref 36.0–46.0)
Hemoglobin: 12.2 g/dL (ref 12.0–15.0)
Immature Granulocytes: 0 %
Lymphocytes Relative: 4 %
Lymphs Abs: 0.3 10*3/uL — ABNORMAL LOW (ref 0.7–4.0)
MCH: 31 pg (ref 26.0–34.0)
MCHC: 33.1 g/dL (ref 30.0–36.0)
MCV: 93.9 fL (ref 80.0–100.0)
Monocytes Absolute: 0.2 10*3/uL (ref 0.1–1.0)
Monocytes Relative: 3 %
Neutro Abs: 6.9 10*3/uL (ref 1.7–7.7)
Neutrophils Relative %: 93 %
Platelets: 225 10*3/uL (ref 150–400)
RBC: 3.93 MIL/uL (ref 3.87–5.11)
RDW: 12.9 % (ref 11.5–15.5)
WBC: 7.5 10*3/uL (ref 4.0–10.5)
nRBC: 0 % (ref 0.0–0.2)

## 2020-04-23 LAB — LACTATE DEHYDROGENASE: LDH: 201 U/L — ABNORMAL HIGH (ref 98–192)

## 2020-04-23 MED ORDER — AEROCHAMBER Z-STAT PLUS/MEDIUM MISC
1.0000 | Freq: Once | Status: AC
  Administered 2020-04-23: 1
  Filled 2020-04-23: qty 1

## 2020-04-23 MED ORDER — SODIUM CHLORIDE 0.9 % IV SOLN
200.0000 mg | Freq: Once | INTRAVENOUS | Status: AC
  Administered 2020-04-23: 200 mg via INTRAVENOUS
  Filled 2020-04-23: qty 200

## 2020-04-23 MED ORDER — SODIUM CHLORIDE 0.9 % IV SOLN
100.0000 mg | Freq: Every day | INTRAVENOUS | Status: AC
  Administered 2020-04-24 – 2020-04-27 (×4): 100 mg via INTRAVENOUS
  Filled 2020-04-23 (×4): qty 20

## 2020-04-23 MED ORDER — ALBUTEROL SULFATE HFA 108 (90 BASE) MCG/ACT IN AERS
2.0000 | INHALATION_SPRAY | Freq: Once | RESPIRATORY_TRACT | Status: AC
  Administered 2020-04-23: 2 via RESPIRATORY_TRACT
  Filled 2020-04-23: qty 6.7

## 2020-04-23 MED ORDER — DEXAMETHASONE SODIUM PHOSPHATE 10 MG/ML IJ SOLN
6.0000 mg | Freq: Once | INTRAMUSCULAR | Status: AC
  Administered 2020-04-23: 6 mg via INTRAVENOUS
  Filled 2020-04-23: qty 1

## 2020-04-23 NOTE — ED Provider Notes (Addendum)
[facility]-EMERGENCY DEPT Provider Note   CSN: [id number] Arrival date & time: 04/23/20  1926     History Chief Complaint  Patient presents with  . covid positive  . Generalized Body Aches    Stacy Contreras is a 79 y.o. female with a history of interstitial lung disease, CAD, DM, DVT on Eliquis, & asthma who presents to the ED with complaints of worsening dyspnea over the past 4-5 days.  Patient states that she has been feeling poorly for the past 4-5 days with generalized fatigue/weakness, body aches, fever, chills, cough with yellow phlegm sputum, & dyspnea. Dyspnea is worse with exertion, no alleviating factors. Recently seen at [facility] S/p syncopal episode last week, a couple days later started feeling bad, went back for COVID testing which resulted positive.  She is fully vaccinated.  She denies recurrent syncope, chest pain, abdominal pain, vomiting, or diarrhea.  Patient has not missed any doses of her anticoagulation.  HPI     Past Medical History:  Diagnosis Date  . Arthritis   . Asthma   . Coronary artery disease   . Diabetes mellitus without complication (HCC)   . DVT (deep venous thrombosis) (HCC)   . GERD (gastroesophageal reflux disease)   . History of breast cancer   . Hypertension   . Interstitial lung disease (HCC)   . Kidney stones   . Shortness of breath dyspnea     Patient Active Problem List   Diagnosis Date Noted  . Interstitial lung disorders (HCC) 05/08/2016  . Dyspnea and respiratory abnormality 02/24/2016  . UTI (lower urinary tract infection) 12/12/2015  . Bronchitis 12/12/2015  . Hypoxia 12/12/2015  . Coronary artery disease 12/12/2015  . Diabetes mellitus without complication (HCC) 12/12/2015  . Hypertension 12/12/2015  . DVT (deep venous thrombosis) (HCC) 12/12/2015  . ILD (interstitial lung disease) (HCC) 12/12/2015  . Acute on chronic respiratory failure with hypoxia (HCC) 12/12/2015  . SOB (shortness of breath)  12/11/2015    Past Surgical History:  Procedure Laterality Date  . APPENDECTOMY    . CARDIAC CATHETERIZATION    . CHOLECYSTECTOMY    . COLONOSCOPY    . CORONARY ANGIOPLASTY    . ESOPHAGOGASTRODUODENOSCOPY    . EYE SURGERY     cataracts  . hysterectomy    . left knee replacement    . LUNG BIOPSY Right 05/08/2016   Procedure: LUNG BIOPSY;  Surgeon: Loreli Slot, MD;  Location: [facility] OR;  Service: Thoracic;  Laterality: Right;  . right breast lumpectomy    . stents x 2    . VIDEO ASSISTED THORACOSCOPY Right 05/08/2016   Procedure: VIDEO ASSISTED THORACOSCOPY;  Surgeon: Loreli Slot, MD;  Location[facility] OR;  Service: Thoracic;  Laterality: Right;  Marland Kitchen VIDEO BRONCHOSCOPY Bilateral 12/19/2015   Procedure: VIDEO BRONCHOSCOPY WITHOUT FLUORO;  Surgeon: Kalman Shan, MD;  Location: [facility] ENDOSCOPY;  Service: Endoscopy;  Laterality: Bilateral;  . VIDEO BRONCHOSCOPY N/A 05/08/2016   Procedure: VIDEO BRONCHOSCOPY;  Surgeon: Loreli Slot, MD;  Location: [facility] OR;  Service: Thoracic;  Laterality: N/A;     OB History   No obstetric history on file.     Family History  Problem Relation Age of Onset  . Lung disease Mother        ? brown lung  . Colon cancer Father   . Breast cancer Sister   . Breast cancer Daughter     Social History   Tobacco Use  . Smoking status: Never Smoker  .  Smokeless tobacco: Never Used  Substance Use Topics  . Alcohol use: Yes    Alcohol/week: 0.0 standard drinks    Comment: occasional  . Drug use: No    Home Medications Prior to Admission medications   Medication Sig Start Date End Date Taking? Authorizing Provider  acetaminophen (TYLENOL) 500 MG tablet Take 1,000 mg by mouth 2 (two) times daily as needed for headache (pain).    [provider]  albuterol (PROVENTIL HFA;VENTOLIN HFA) 108 (90 Base) MCG/ACT inhaler Inhale 2 puffs into the lungs every 4 (four) hours as needed for wheezing or shortness of breath.    [provider]  amLODipine (NORVASC) 5 MG tablet Take 5 mg by mouth at bedtime.     [provider]  apixaban (ELIQUIS) 5 MG TABS tablet Take 0.5 tablets (2.5 mg total) by mouth 2 (two) times daily. Hold on Eliquis the day prior to Bronchoscopy Patient taking differently: Take 2.5 mg by mouth 2 (two) times daily.  12/13/15   Zannie Cove, MD  aspirin EC 81 MG tablet Take 81 mg by mouth daily.    [provider]  atenolol (TENORMIN) 25 MG tablet Take 25 mg by mouth 2 (two) times daily.    [provider]  Black Cohosh 40 MG CAPS Take 120 mg by mouth 2 (two) times daily.    [provider]  Cholecalciferol (VITAMIN D3) 2000 units capsule Take 2,000 Units by mouth daily.    [provider]  cycloSPORINE (RESTASIS) 0.05 % ophthalmic emulsion Place 1 drop into both eyes 2 (two) times daily.    [provider]  dexlansoprazole (DEXILANT) 60 MG capsule Take 60 mg by mouth at bedtime.     [provider]  docusate sodium (COLACE) 100 MG capsule Take 200 mg by mouth at bedtime.     [provider]  fluticasone (FLONASE) 50 MCG/ACT nasal spray Place 1 spray into both nostrils daily as needed for allergies or rhinitis.    [provider]  Fluticasone-Salmeterol (ADVAIR) 250-50 MCG/DOSE AEPB Inhale 1 puff into the lungs 2 (two) times daily.    [provider]  gabapentin (NEURONTIN) 300 MG capsule Take 600 mg by mouth at bedtime.     [provider]  HYDROcodone-homatropine (HYCODAN) 5-1.5 MG/5ML syrup Take 5 mLs by mouth every 6 (six) hours as needed for cough. Patient not taking: Reported on 05/26/2016 12/13/15   Zannie Cove, MD  isosorbide mononitrate (IMDUR) 30 MG 24 hr tablet Take 30 mg by mouth at bedtime.     [provider]  losartan (COZAAR) 100 MG tablet Take 100 mg by mouth at bedtime.    [provider]  lubiprostone (AMITIZA) 24 MCG capsule Take 24 mcg by mouth 2 (two) times  daily with a meal.    [provider]  metFORMIN (GLUCOPHAGE) 500 MG tablet Take 1,000 mg by mouth 2 (two) times daily with a meal.    [provider]  nitroGLYCERIN (NITROSTAT) 0.4 MG SL tablet Place 0.4 mg under the tongue every 5 (five) minutes as needed for chest pain.    [provider]  Omega-3 Fatty Acids (FISH OIL PO) Take 1,400 mg by mouth daily.    [provider]  OXYGEN Inhale 2 L into the lungs as needed (shortness of breath/ low oxygen stats).    [provider]  PARoxetine (PAXIL) 20 MG tablet Take 10 mg by mouth 2 (two) times daily.     [provider]  sitaGLIPtin (JANUVIA) 100 MG tablet Take 100 mg by mouth daily with supper.     [provider]  traMADol (ULTRAM) 50 MG tablet Take 1 tablet (50 mg total) by mouth every 6 (six) hours as needed (mild pain). Patient not taking: Reported on 05/26/2016 05/11/16   Ardelle Balls, PA-C  zolpidem (AMBIEN) 10 MG tablet Take 10 mg by mouth at bedtime.     [provider]    Allergies    Codeine, Statins, and Sulfa antibiotics  Review of Systems   Review of Systems  Constitutional: Positive for chills, fatigue and fever.  Respiratory: Positive for cough and shortness of breath.   Cardiovascular: Negative for chest pain and leg swelling.  Gastrointestinal: Negative for abdominal pain, diarrhea and vomiting.  Genitourinary: Negative for dysuria.  Musculoskeletal: Positive for myalgias.  Neurological: Negative for syncope (since last ED visit in [place]).  All other systems reviewed and are negative.   Physical Exam Updated Vital Signs BP (!) 176/74 (BP Location: Left Arm)   Pulse 68   Temp 98.4 F (36.9 C) (Oral)   Resp (!) 22   Ht 5\' 5"  (1.651 m)   Wt 68 kg   SpO2 (!) 83%   BMI 24.96 kg/m   Physical Exam Vitals and nursing note reviewed.  Constitutional:      General: She is not in acute distress.    Appearance: She is well-developed. She  is not toxic-appearing.  HENT:     Head: Normocephalic and atraumatic.  Eyes:     General:        Right eye: No discharge.        Left eye: No discharge.     Conjunctiva/sclera: Conjunctivae normal.  Cardiovascular:     Rate and Rhythm: Normal rate and regular rhythm.  Pulmonary:     Effort: No respiratory distress.     Breath sounds: Rhonchi (scattered) present. No wheezing or rales.     Comments: Tachypnea. SPO2 desaturation to upper 80s on RA, 83% in triage, saturating well on 1L via [place].  Abdominal:     General: There is no distension.     Palpations: Abdomen is soft.     Tenderness: There is no abdominal tenderness.  Musculoskeletal:     Cervical back: Neck supple.     Right lower leg: No edema.     Left lower leg: No edema.     Comments: Right wrist is bandaged, patient reports a laceration.  Has been doing well.  Skin:    General: Skin is warm and dry.     Findings: No rash.  Neurological:     Mental Status: She is alert.     Comments: Clear speech.   Psychiatric:        Behavior: Behavior normal.    ED Results / Procedures / Treatments   Labs (all labs ordered are listed, but only abnormal results are displayed) Labs Reviewed  SARS CORONAVIRUS 2 BY RT PCR (HOSPITAL ORDER, PERFORMED IN [facility])  CULTURE, BLOOD (ROUTINE X 2)  CULTURE, BLOOD (ROUTINE X 2)  LACTIC ACID, PLASMA  LACTIC ACID, PLASMA  CBC WITH DIFFERENTIAL/PLATELET  COMPREHENSIVE METABOLIC PANEL  D-DIMER, QUANTITATIVE (NOT AT [facility])  PROCALCITONIN  LACTATE DEHYDROGENASE  FERRITIN  TRIGLYCERIDES  FIBRINOGEN  C-REACTIVE PROTEIN    EKG None  Radiology DG Chest 2 View  Result Date: 04/23/2020 CLINICAL DATA:  Shortness of breath, COVID positive, history of interstitial lung disease. History of breast cancer.  Diabetes. History of DVT. History of asthma. EXAM: CHEST - 2 VIEW COMPARISON:  Chest x-ray 05/26/2016. CT chest 01/20/2016 FINDINGS: The heart size and mediastinal contours  are within normal limits. Aortic arch calcifications. Coronary artery stent in similar position. Redemonstration of right mid and lower lung zone pulmonary staples. Interval development of bilateral lower lung zone patchy airspace opacities. Persistent biapical pleural/pulmonary scarring. Suggestion of a right upper lobe granuloma. No pulmonary edema. No pleural effusion. No pneumothorax. No acute osseous abnormality. Surgical clips overlie the upper abdomen. IMPRESSION: Bilateral lower lung zone pulmonary findings consistent with known COVID 19 infection. Electronically Signed   By: Tish Frederickson M.D.   On: 04/23/2020 20:21    Procedures .Critical Care Performed by: Cherly Anderson, PA-C Authorized by: Cherly Anderson, PA-C    CRITICAL CARE Performed by: Harvie Heck   Total critical care time: 30 minutes  Critical care time was exclusive of separately billable procedures and treating other patients.  Critical care was necessary to treat or prevent imminent or life-threatening deterioration.  Critical care was time spent personally by me on the following activities: development of treatment plan with patient and/or surrogate as well as nursing, discussions with consultants, evaluation of patient's response to treatment, examination of patient, obtaining history from patient or surrogate, ordering and performing treatments and interventions, ordering and review of laboratory studies, ordering and review of radiographic studies, pulse oximetry and re-evaluation of patient's condition.    (including critical care time)  Medications Ordered in ED Medications  remdesivir 200 mg in sodium chloride 0.9% 250 mL IVPB (200 mg Intravenous New Bag/Given (Non-Interop) 04/23/20 2343)    Followed by  remdesivir 100 mg in sodium chloride 0.9 % 100 mL IVPB (has no administration in time range)  dexamethasone (DECADRON) injection 6 mg (6 mg Intravenous Given 04/23/20 2343)    albuterol (VENTOLIN HFA) 108 (90 Base) MCG/ACT inhaler 2 puff (2 puffs Inhalation Given 04/23/20 2343)  aerochamber Z-Stat Plus/medium 1 each (1 each Other Given 04/23/20 2344)    ED Course  I have reviewed the triage vital signs and the nursing notes.  Pertinent labs & imaging results that were available during my care of the patient were reviewed by me and considered in my medical decision making (see chart for details).    MDM Rules/Calculators/A&P                         Patient presents to the ED with complaints of dyspnea in setting of COVID 19.  She is currently on day 5 of symptoms.  She is hypoxic into the 80s on room air requiring 1 L via nasal cannula at rest.  We will start Decadron and remdesivir and provided inhaler.  Additional history obtained:  Additional history per nursing note review. Previous records obtained and reviewed.   Imaging Studies ordered:  CXR ordered per triage, I independently visualized and interpreted imaging which showed Bilateral lower lung zone pulmonary findings consistent with known COVID 19 infection.   Labs: I have ordered & interpreted labs including:  CBC: No significant derangement.   23:53: Patient care signed out to [employer] PA-C at change of shift pending remaining labs & likely admission.   Portions of this note were generated with Scientist, clinical (histocompatibility and immunogenetics). Dictation errors may occur despite best attempts at proofreading.  Final Clinical Impression(s) / ED Diagnoses Final diagnoses:  COVID-19    Rx / DC Orders ED Discharge Orders    None  Cherly Andersonetrucelli, Taeshawn Helfman R, PA-C 04/23/20 2354    Cherly Andersonetrucelli, Stratton [place]ock R, PA-C 04/24/20 0042    Maia PlanLong, Joshua G, MD 04/24/20 1044

## 2020-04-23 NOTE — ED Triage Notes (Signed)
Patient states she went to [facility] due to her being sick a few days ago. Patient states that she is covid positive. Patient states that she if feeling bad. Patient put on 2L [place] O2-87%.

## 2020-04-24 DIAGNOSIS — W19XXXA Unspecified fall, initial encounter: Secondary | ICD-10-CM | POA: Diagnosis present

## 2020-04-24 DIAGNOSIS — J80 Acute respiratory distress syndrome: Secondary | ICD-10-CM | POA: Diagnosis present

## 2020-04-24 DIAGNOSIS — Z9981 Dependence on supplemental oxygen: Secondary | ICD-10-CM | POA: Diagnosis not present

## 2020-04-24 DIAGNOSIS — J849 Interstitial pulmonary disease, unspecified: Secondary | ICD-10-CM

## 2020-04-24 DIAGNOSIS — I10 Essential (primary) hypertension: Secondary | ICD-10-CM | POA: Diagnosis present

## 2020-04-24 DIAGNOSIS — I2510 Atherosclerotic heart disease of native coronary artery without angina pectoris: Secondary | ICD-10-CM | POA: Diagnosis present

## 2020-04-24 DIAGNOSIS — Z7984 Long term (current) use of oral hypoglycemic drugs: Secondary | ICD-10-CM | POA: Diagnosis not present

## 2020-04-24 DIAGNOSIS — T380X5A Adverse effect of glucocorticoids and synthetic analogues, initial encounter: Secondary | ICD-10-CM | POA: Diagnosis not present

## 2020-04-24 DIAGNOSIS — K219 Gastro-esophageal reflux disease without esophagitis: Secondary | ICD-10-CM | POA: Diagnosis present

## 2020-04-24 DIAGNOSIS — I424 Endocardial fibroelastosis: Secondary | ICD-10-CM | POA: Diagnosis present

## 2020-04-24 DIAGNOSIS — J1282 Pneumonia due to coronavirus disease 2019: Secondary | ICD-10-CM

## 2020-04-24 DIAGNOSIS — Z853 Personal history of malignant neoplasm of breast: Secondary | ICD-10-CM | POA: Diagnosis not present

## 2020-04-24 DIAGNOSIS — Z96652 Presence of left artificial knee joint: Secondary | ICD-10-CM | POA: Diagnosis present

## 2020-04-24 DIAGNOSIS — J45909 Unspecified asthma, uncomplicated: Secondary | ICD-10-CM | POA: Diagnosis present

## 2020-04-24 DIAGNOSIS — E1142 Type 2 diabetes mellitus with diabetic polyneuropathy: Secondary | ICD-10-CM | POA: Diagnosis present

## 2020-04-24 DIAGNOSIS — Z86718 Personal history of other venous thrombosis and embolism: Secondary | ICD-10-CM | POA: Diagnosis not present

## 2020-04-24 DIAGNOSIS — Z7901 Long term (current) use of anticoagulants: Secondary | ICD-10-CM | POA: Diagnosis not present

## 2020-04-24 DIAGNOSIS — S61511A Laceration without foreign body of right wrist, initial encounter: Secondary | ICD-10-CM | POA: Diagnosis present

## 2020-04-24 DIAGNOSIS — Z888 Allergy status to other drugs, medicaments and biological substances status: Secondary | ICD-10-CM | POA: Diagnosis not present

## 2020-04-24 DIAGNOSIS — Z955 Presence of coronary angioplasty implant and graft: Secondary | ICD-10-CM | POA: Diagnosis not present

## 2020-04-24 DIAGNOSIS — U071 COVID-19: Secondary | ICD-10-CM | POA: Diagnosis present

## 2020-04-24 DIAGNOSIS — F329 Major depressive disorder, single episode, unspecified: Secondary | ICD-10-CM | POA: Diagnosis present

## 2020-04-24 DIAGNOSIS — E1165 Type 2 diabetes mellitus with hyperglycemia: Secondary | ICD-10-CM | POA: Diagnosis not present

## 2020-04-24 DIAGNOSIS — F419 Anxiety disorder, unspecified: Secondary | ICD-10-CM | POA: Diagnosis present

## 2020-04-24 DIAGNOSIS — Z882 Allergy status to sulfonamides status: Secondary | ICD-10-CM | POA: Diagnosis not present

## 2020-04-24 DIAGNOSIS — Z885 Allergy status to narcotic agent status: Secondary | ICD-10-CM | POA: Diagnosis not present

## 2020-04-24 LAB — COMPREHENSIVE METABOLIC PANEL
ALT: 26 U/L (ref 0–44)
AST: 32 U/L (ref 15–41)
Albumin: 3.6 g/dL (ref 3.5–5.0)
Alkaline Phosphatase: 52 U/L (ref 38–126)
Anion gap: 10 (ref 5–15)
BUN: 9 mg/dL (ref 8–23)
CO2: 22 mmol/L (ref 22–32)
Calcium: 8.3 mg/dL — ABNORMAL LOW (ref 8.9–10.3)
Chloride: 98 mmol/L (ref 98–111)
Creatinine, Ser: 0.81 mg/dL (ref 0.44–1.00)
GFR calc Af Amer: >60 mL/min (ref >60)
GFR calc non Af Amer: >60 mL/min (ref >60)
Glucose, Bld: 229 mg/dL — ABNORMAL HIGH (ref 70–99)
Potassium: 4 mmol/L (ref 3.5–5.1)
Sodium: 130 mmol/L — ABNORMAL LOW (ref 135–145)
Total Bilirubin: 0.5 mg/dL (ref 0.3–1.2)
Total Protein: 7.4 g/dL (ref 6.5–8.1)

## 2020-04-24 LAB — CBG MONITORING, ED
Glucose-Capillary: 188 mg/dL — ABNORMAL HIGH (ref 70–99)
Glucose-Capillary: 131 mg/dL — ABNORMAL HIGH (ref 70–99)
Glucose-Capillary: 113 mg/dL — ABNORMAL HIGH (ref 70–99)
Glucose-Capillary: 130 mg/dL — ABNORMAL HIGH (ref 70–99)

## 2020-04-24 LAB — FIBRINOGEN: Fibrinogen: 596 mg/dL — ABNORMAL HIGH (ref 210–475)

## 2020-04-24 LAB — PROCALCITONIN: Procalcitonin: <0.1 ng/mL

## 2020-04-24 LAB — FERRITIN: Ferritin: 229 ng/mL (ref 11–307)

## 2020-04-24 LAB — PROTIME-INR
INR: 1 (ref 0.8–1.2)
Prothrombin Time: 13 seconds (ref 11.4–15.2)

## 2020-04-24 LAB — CBC
HCT: 35.6 % — ABNORMAL LOW (ref 36.0–46.0)
Hemoglobin: 12.3 g/dL (ref 12.0–15.0)
MCH: 31.8 pg (ref 26.0–34.0)
MCHC: 34.6 g/dL (ref 30.0–36.0)
MCV: 92 fL (ref 80.0–100.0)
Platelets: 245 10*3/uL (ref 150–400)
RBC: 3.87 MIL/uL (ref 3.87–5.11)
RDW: 12.9 % (ref 11.5–15.5)
WBC: 9.1 10*3/uL (ref 4.0–10.5)
nRBC: 0 % (ref 0.0–0.2)

## 2020-04-24 LAB — TRIGLYCERIDES: Triglycerides: 90 mg/dL (ref <150)

## 2020-04-24 LAB — HEMOGLOBIN A1C
Hgb A1c MFr Bld: 6.7 % — ABNORMAL HIGH (ref 4.8–5.6)
Mean Plasma Glucose: 145.59 mg/dL

## 2020-04-24 LAB — D-DIMER, QUANTITATIVE: D-Dimer, Quant: 0.41 ug/mL-FEU (ref 0.00–0.50)

## 2020-04-24 LAB — SARS CORONAVIRUS 2 BY RT PCR (HOSPITAL ORDER, PERFORMED IN ~~LOC~~ HOSPITAL LAB): SARS Coronavirus 2: POSITIVE — AB

## 2020-04-24 LAB — C-REACTIVE PROTEIN: CRP: 16.8 mg/dL — ABNORMAL HIGH (ref <1.0)

## 2020-04-24 MED ORDER — DOCUSATE SODIUM 100 MG PO CAPS
200.0000 mg | ORAL_CAPSULE | Freq: Every day | ORAL | Status: DC
  Administered 2020-04-24 – 2020-04-28 (×4): 200 mg via ORAL
  Filled 2020-04-24 (×4): qty 2

## 2020-04-24 MED ORDER — LOSARTAN POTASSIUM 50 MG PO TABS
100.0000 mg | ORAL_TABLET | Freq: Every day | ORAL | Status: DC
  Administered 2020-04-24 – 2020-04-28 (×5): 100 mg via ORAL
  Filled 2020-04-24 (×6): qty 2

## 2020-04-24 MED ORDER — ZOLPIDEM TARTRATE 5 MG PO TABS
5.0000 mg | ORAL_TABLET | Freq: Every day | ORAL | Status: DC
  Administered 2020-04-24 – 2020-04-28 (×5): 5 mg via ORAL
  Filled 2020-04-24 (×5): qty 1

## 2020-04-24 MED ORDER — INSULIN ASPART 100 UNIT/ML ~~LOC~~ SOLN
0.0000 [IU] | Freq: Three times a day (TID) | SUBCUTANEOUS | Status: DC
  Administered 2020-04-24: 3 [IU] via SUBCUTANEOUS
  Administered 2020-04-24: 2 [IU] via SUBCUTANEOUS
  Administered 2020-04-25: 3 [IU] via SUBCUTANEOUS
  Administered 2020-04-25: 2 [IU] via SUBCUTANEOUS
  Administered 2020-04-26: 3 [IU] via SUBCUTANEOUS
  Administered 2020-04-26: 2 [IU] via SUBCUTANEOUS
  Administered 2020-04-26 – 2020-04-28 (×5): 5 [IU] via SUBCUTANEOUS
  Administered 2020-04-28: 2 [IU] via SUBCUTANEOUS
  Administered 2020-04-29: 3 [IU] via SUBCUTANEOUS
  Filled 2020-04-24: qty 0.15

## 2020-04-24 MED ORDER — ATENOLOL 25 MG PO TABS
25.0000 mg | ORAL_TABLET | Freq: Two times a day (BID) | ORAL | Status: DC
  Administered 2020-04-24 – 2020-04-25 (×5): 25 mg via ORAL
  Filled 2020-04-24 (×6): qty 1

## 2020-04-24 MED ORDER — GUAIFENESIN ER 600 MG PO TB12
600.0000 mg | ORAL_TABLET | Freq: Two times a day (BID) | ORAL | Status: DC
  Administered 2020-04-24 – 2020-04-29 (×12): 600 mg via ORAL
  Filled 2020-04-24 (×12): qty 1

## 2020-04-24 MED ORDER — ALBUTEROL SULFATE HFA 108 (90 BASE) MCG/ACT IN AERS
2.0000 | INHALATION_SPRAY | Freq: Four times a day (QID) | RESPIRATORY_TRACT | Status: DC
  Administered 2020-04-24 – 2020-04-29 (×19): 2 via RESPIRATORY_TRACT
  Filled 2020-04-24 (×4): qty 6.7

## 2020-04-24 MED ORDER — ISOSORBIDE MONONITRATE ER 30 MG PO TB24
30.0000 mg | ORAL_TABLET | Freq: Every day | ORAL | Status: DC
  Administered 2020-04-24 – 2020-04-25 (×2): 30 mg via ORAL
  Filled 2020-04-24 (×3): qty 1

## 2020-04-24 MED ORDER — ACETAMINOPHEN 325 MG PO TABS
650.0000 mg | ORAL_TABLET | Freq: Four times a day (QID) | ORAL | Status: DC | PRN
  Administered 2020-04-27 – 2020-04-28 (×2): 650 mg via ORAL
  Filled 2020-04-24 (×2): qty 2

## 2020-04-24 MED ORDER — VITAMIN D 25 MCG (1000 UNIT) PO TABS
1000.0000 [IU] | ORAL_TABLET | Freq: Every day | ORAL | Status: DC
  Administered 2020-04-24 – 2020-04-29 (×6): 1000 [IU] via ORAL
  Filled 2020-04-24 (×7): qty 1

## 2020-04-24 MED ORDER — TRAMADOL HCL 50 MG PO TABS: 50.0000 mg | ORAL_TABLET | Freq: Four times a day (QID) | ORAL | Status: DC | PRN

## 2020-04-24 MED ORDER — ZINC SULFATE 220 (50 ZN) MG PO CAPS
220.0000 mg | ORAL_CAPSULE | Freq: Every day | ORAL | Status: DC
  Administered 2020-04-24 – 2020-04-29 (×6): 220 mg via ORAL
  Filled 2020-04-24 (×6): qty 1

## 2020-04-24 MED ORDER — GABAPENTIN 300 MG PO CAPS
600.0000 mg | ORAL_CAPSULE | Freq: Every day | ORAL | Status: DC
  Administered 2020-04-24 – 2020-04-28 (×5): 600 mg via ORAL
  Filled 2020-04-24 (×5): qty 2

## 2020-04-24 MED ORDER — SENNOSIDES-DOCUSATE SODIUM 8.6-50 MG PO TABS: 1.0000 | ORAL_TABLET | Freq: Every evening | ORAL | Status: DC | PRN

## 2020-04-24 MED ORDER — PAROXETINE HCL 10 MG PO TABS
10.0000 mg | ORAL_TABLET | Freq: Two times a day (BID) | ORAL | Status: DC
  Administered 2020-04-24 – 2020-04-29 (×12): 10 mg via ORAL
  Filled 2020-04-24 (×13): qty 1

## 2020-04-24 MED ORDER — IPRATROPIUM BROMIDE 0.02 % IN SOLN
0.5000 mg | Freq: Four times a day (QID) | RESPIRATORY_TRACT | Status: DC
  Filled 2020-04-24: qty 2.5

## 2020-04-24 MED ORDER — IPRATROPIUM BROMIDE HFA 17 MCG/ACT IN AERS
2.0000 | INHALATION_SPRAY | Freq: Four times a day (QID) | RESPIRATORY_TRACT | Status: DC
  Administered 2020-04-24 – 2020-04-29 (×20): 2 via RESPIRATORY_TRACT
  Filled 2020-04-24: qty 12.9

## 2020-04-24 MED ORDER — DEXAMETHASONE SODIUM PHOSPHATE 10 MG/ML IJ SOLN
6.0000 mg | INTRAMUSCULAR | Status: DC
  Administered 2020-04-25: 6 mg via INTRAVENOUS
  Filled 2020-04-24: qty 1

## 2020-04-24 MED ORDER — AMLODIPINE BESYLATE 5 MG PO TABS
5.0000 mg | ORAL_TABLET | Freq: Every day | ORAL | Status: DC
  Administered 2020-04-24 – 2020-04-25 (×2): 5 mg via ORAL
  Filled 2020-04-24 (×2): qty 1

## 2020-04-24 MED ORDER — ASCORBIC ACID 500 MG PO TABS
500.0000 mg | ORAL_TABLET | Freq: Every day | ORAL | Status: DC
  Administered 2020-04-24 – 2020-04-29 (×6): 500 mg via ORAL
  Filled 2020-04-24 (×6): qty 1

## 2020-04-24 MED ORDER — ACETAMINOPHEN 650 MG RE SUPP: 650.0000 mg | Freq: Four times a day (QID) | RECTAL | Status: DC | PRN

## 2020-04-24 MED ORDER — IPRATROPIUM BROMIDE HFA 17 MCG/ACT IN AERS
2.0000 | INHALATION_SPRAY | RESPIRATORY_TRACT | Status: DC
  Administered 2020-04-24: 2 via RESPIRATORY_TRACT
  Filled 2020-04-24: qty 12.9

## 2020-04-24 MED ORDER — PANTOPRAZOLE SODIUM 40 MG PO TBEC
40.0000 mg | DELAYED_RELEASE_TABLET | Freq: Every day | ORAL | Status: DC
  Administered 2020-04-25 – 2020-04-29 (×5): 40 mg via ORAL
  Filled 2020-04-24 (×5): qty 1

## 2020-04-24 MED ORDER — APIXABAN 2.5 MG PO TABS
2.5000 mg | ORAL_TABLET | Freq: Two times a day (BID) | ORAL | Status: DC
  Administered 2020-04-24 – 2020-04-29 (×12): 2.5 mg via ORAL
  Filled 2020-04-24 (×13): qty 1

## 2020-04-24 MED ORDER — HYDROCODONE-ACETAMINOPHEN 5-325 MG PO TABS
1.0000 | ORAL_TABLET | ORAL | Status: DC | PRN
  Administered 2020-04-24: 1 via ORAL
  Filled 2020-04-24: qty 1

## 2020-04-24 NOTE — Progress Notes (Signed)
HPI: Stacy Contreras is a 79 y.o. 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  Patient reports generalized malaise, body aches followed by fever 103.6 chills and mostly dry cough.  Bought a home kit to check for COVID and was positive.  Next day went to [facility] to confirm and test was affirmative.   She reports worsening dyspnea with persistent cough and wheezing. Increased O2 requirement to 2-3LNC. Called PCP who advised to come to ED.   No CP, palpitation, orthopnea or PND.   She is fully vaccinated with Moderna.   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  No h/o smoking, etoh or street drugs.    ED Course: 140/111, pulse 87, RR 22-26, SPO2 83-92% requiring 2 L nasal cannula T-max 98.4  -CXR with lower lobe opacities consistent with Covid infection. -EKG with NSR at 70, normal axis, no significant ST-T wave changes.  Intervals within normal limits.  -Labs with -CRP 16.8 -Lactic acid 1.4 -D-dimer 0.41 -Procalcitonin less than 0.10 -LDH 201 -Ferritin 229 -Fibrinogen 596 -Covid PCR positive -WBC 7.5, Hb 12.2, platelets 225 -Sodium 131, potassium 4.2, BUN 11, creatinine 0.99 -Albumin 3.6, AST 28, ALT 25, ALP 49.  04/24/20: Seen and examined at bedside in ED.  Reports persistent nonproductive cough.  Breathing is improving.  Only 1 to 2 L oxygen supplementation at baseline currently requiring 3 L.  Independently reviewed chest x-ray done on admission which shows mild bilateral pulmonary infiltrates suggestive of COVID-19 viral pneumonia.  She was started on COVID-19 directed therapies, IV Decadron and remdesivir.  Pulmonary consulted and following.  Appreciate specialists assistance.  Please refer to H&P dictated by my partner Dr.  Vallery Ridge on 04/24/2020 for further details of the assessment and plan.

## 2020-04-24 NOTE — Consult Note (Signed)
NAME:  Stacy Contreras, MRN:  [id number], DOB:  04-May-1941, LOS: 0 ADMISSION DATE:  04/23/2020, CONSULTATION DATE:  04/24/20 REFERRING MD: Dr. Margo Aye , CHIEF COMPLAINT:  Weakness, fatigue  Brief History   79 y/o F, with 2L O2 depending ILD, who presented to [facility] on 9/14 with reports of weakness and fatigue for ~ 7-10 days.  Known COVID positive as outpatient.    History of present illness   79 y/o F wo presented to [facility] on 9/14 with reports of feeling poorly for 7-10 days, weakness and fatigue.  She indicates she felt so bad that at one point she got up to go to the kitchen and collapsed.  She was seen in the [place] ER at that time and required sutures for a wrist laceration.  She was not tested for COVID at that time. She noted increasing shortness of breath, limited activity tolerance and felt as if she needed to increase her O2 from baseline 2L.  She checked her saturations at home and had sats down to 84% intermittently with quick recovery.  She called her primary for the above symptoms and was given a prednisone taper.  Had fever to 103 at home. She continued to feel bad and [facility] and [facility]. Her husband is positive as well.  She reports she has completed her Moderna vaccinations x2 in 09/2019 and 10/2019. (card seen in ER).    PCCM called for pulmonary evaluation.    Past Medical History  ILD  Chronic Hypoxic Respiratory Failure   Significant Hospital Events   9/14 admit  Consults:    Procedures:    Significant Diagnostic Tests:  9/14 CXR >> mild-moderate lower patchy opacities   Micro Data:  COVID 9/14 >> positive  BCx2 9/14 >>   Antimicrobials:  Remdesivir 9/14 >>    Interim history/subjective:  Pt reports feeling tired but ok.  Wishes she had a book to read.   On 3L O2   Objective   Blood pressure (!) 146/79, pulse 72, temperature 98.6 F (37 C), resp. rate 20, height 5\' 5"  (1.651 m), weight 68 kg, SpO2 99 %.        Intake/Output  Summary (Last 24 hours) at 04/24/2020 1025 Last data filed at 04/24/2020 0020 Gross per 24 hour  Intake 250 ml  Output --  Net 250 ml   Filed Weights   04/23/20 2226  Weight: 68 kg    Examination: General: pleasant elderly adult female lying in bed in NAD HEENT: MM pink/moist, [place] O2 3L, anicteric, wearing glasses Neuro: AAOx4, speech clear, MAE CV: s1s2 rrr, no m/r/g PULM: non-labored on [place], lungs bilaterally with faint basilar crackles, speaking full sentences, no work of breathing  GI: soft, bsx4 active  Extremities: warm/dry, no edema  Skin: no rashes or lesions  Resolved Hospital Problem list      Assessment & Plan:   Acute on Chronic Hypoxic Respiratory Failure in setting of COVID 19  Vaccinated patient, minimal bibasilar infiltrates, no significant increase in O2 needs (pt on 3L from 2L at baseline) -wean O2 for sats >85%  -prone positioning if able  -follow intermittent CXR -continue decadron, remdesivir -if escalating O2 needs consider Baricitinib  -mobilize as able - OOB, ambulate in room   ILD -hold home cellcept -will likely need new baseline CT once recovered from acute illness    Best practice:  Diet: per primary  Pain/Anxiety/Delirium protocol (if indicated): n/a  VAP protocol (if indicated):  n/a  DVT prophylaxis: per primary  GI prophylaxis: PPI Glucose control: per primary  Mobility: as tolerated  Code Status: Full Code  Family Communication: Patient updated on plan of care  Disposition: per [facility]   Labs   CBC: Recent Labs  Lab 04/23/20 2313 04/24/20 0316  WBC 7.5 9.1  NEUTROABS 6.9  --   HGB 12.2 12.3  HCT 36.9 35.6*  MCV 93.9 92.0  PLT 225 245    Basic Metabolic Panel: Recent Labs  Lab 04/23/20 2313 04/24/20 0316  NA 131* 130*  K 4.2 4.0  CL 98 98  CO2 24 22  GLUCOSE 216* 229*  BUN 11 9  CREATININE 0.99 0.81  CALCIUM 8.5* 8.3*   GFR: Estimated Creatinine Clearance: 52.3 mL/min (by C-G formula based on SCr of 0.81  mg/dL). Recent Labs  Lab 04/23/20 2313 04/24/20 0316  PROCALCITON <0.10  --   WBC 7.5 9.1  LATICACIDVEN 1.4  --     Liver Function Tests: Recent Labs  Lab 04/23/20 2313 04/24/20 0316  AST 28 32  ALT 25 26  ALKPHOS 49 52  BILITOT 0.6 0.5  PROT 7.1 7.4  ALBUMIN 3.6 3.6   No results for input(s): LIPASE, AMYLASE in the last 168 hours. No results for input(s): AMMONIA in the last 168 hours.  ABG    Component Value Date/Time   PHART 7.345 (L) 05/09/2016 0422   PCO2ART 54.7 (H) 05/09/2016 0422   PO2ART 71.2 (L) 05/09/2016 0422   HCO3 29.0 (H) 05/09/2016 0422   ACIDBASEDEF 0.3 05/06/2016 1351   O2SAT 92.7 05/09/2016 0422     Coagulation Profile: Recent Labs  Lab 04/24/20 0316  INR 1.0    Cardiac Enzymes: No results for input(s): CKTOTAL, CKMB, CKMBINDEX, TROPONINI in the last 168 hours.  HbA1C: Hgb A1c MFr Bld  Date/Time Value Ref Range Status  04/24/2020 03:16 AM 6.7 (H) 4.8 - 5.6 % Final    Comment:    (NOTE) Pre diabetes:          5.7%-6.4%  Diabetes:              >6.4%  Glycemic control for   <7.0% adults with diabetes   05/06/2016 01:50 PM 5.7 (H) 4.8 - 5.6 % Final    Comment:    (NOTE)         Pre-diabetes: 5.7 - 6.4         Diabetes: >6.4         Glycemic control for adults with diabetes: <7.0     CBG: Recent Labs  Lab 04/24/20 0835  GLUCAP 188*    Review of Systems: Positives in [place]   Gen: Denies fever, chills, weight change, fatigue, night sweats HEENT: Denies blurred vision, double vision, hearing loss, tinnitus, sinus congestion, rhinorrhea, sore throat, neck stiffness, dysphagia PULM: Denies shortness of breath, cough, sputum production, hemoptysis, wheezing CV: Denies chest pain, edema, orthopnea, paroxysmal nocturnal dyspnea, palpitations GI: Denies abdominal pain, nausea, vomiting, diarrhea, hematochezia, melena, constipation, change in bowel habits GU: Denies dysuria, hematuria, polyuria, oliguria, urethral  discharge Endocrine: Denies hot or cold intolerance, polyuria, polyphagia or appetite change Derm: Denies rash, dry skin, scaling or peeling skin change Heme: Denies easy bruising, bleeding, bleeding gums Neuro: Denies headache, numbness, weakness, slurred speech, loss of memory or consciousness   Past Medical History  She,  has a past medical history of Arthritis, Asthma, Coronary artery disease, Diabetes mellitus without complication (HCC), DVT (deep venous thrombosis) (HCC), GERD (gastroesophageal reflux disease), History  of breast cancer, Hypertension, Interstitial lung disease (HCC), Kidney stones, and Shortness of breath dyspnea.   Surgical History    Past Surgical History:  Procedure Laterality Date  . APPENDECTOMY    . CARDIAC CATHETERIZATION    . CHOLECYSTECTOMY    . COLONOSCOPY    . CORONARY ANGIOPLASTY    . ESOPHAGOGASTRODUODENOSCOPY    . EYE SURGERY     cataracts  . hysterectomy    . left knee replacement    . LUNG BIOPSY Right 05/08/2016   Procedure: LUNG BIOPSY;  Surgeon: Loreli Slot, MD;  Location: [facility] OR;  Service: Thoracic;  Laterality: Right;  . right breast lumpectomy    . stents x 2    . VIDEO ASSISTED THORACOSCOPY Right 05/08/2016   Procedure: VIDEO ASSISTED THORACOSCOPY;  Surgeon: Loreli Slot, MD;  Location: [facility] OR;  Service: Thoracic;  Laterality: Right;  Marland Kitchen VIDEO BRONCHOSCOPY Bilateral 12/19/2015   Procedure: VIDEO BRONCHOSCOPY WITHOUT FLUORO;  Surgeon: Kalman Shan, MD;  Location: [facility];  Service: Endoscopy;  Laterality: Bilateral;  . VIDEO BRONCHOSCOPY N/A 05/08/2016   Procedure: VIDEO BRONCHOSCOPY;  Surgeon: Loreli Slot, MD;  Location[facility] OR;  Service: Thoracic;  Laterality: N/A;     Social History   reports that she has never smoked. She has never used smokeless tobacco. She reports current alcohol use. She reports that she does not use drugs.   Family History   Her family history includes Breast cancer in her daughter  and sister; Colon cancer in her father; Lung disease in her mother.   Allergies Allergies  Allergen Reactions  . Codeine Other (See Comments)    Fast heart beat (tolerates hydrocodone)  . Statins Other (See Comments)    Muscle weakness  . Sulfa Antibiotics Rash     Home Medications  Prior to Admission medications   Medication Sig Start Date End Date Taking? Authorizing Provider  acetaminophen (TYLENOL) 500 MG tablet Take 1,000 mg by mouth 2 (two) times daily as needed for headache (pain).   Yes [provider]  albuterol (PROVENTIL HFA;VENTOLIN HFA) 108 (90 Base) MCG/ACT inhaler Inhale 2 puffs into the lungs every 4 (four) hours as needed for wheezing or shortness of breath.   Yes [provider]  amLODipine (NORVASC) 5 MG tablet Take 5 mg by mouth at bedtime.    Yes [provider]  apixaban (ELIQUIS) 5 MG TABS tablet Take 0.5 tablets (2.5 mg total) by mouth 2 (two) times daily. Hold on Eliquis the day prior to Bronchoscopy Patient taking differently: Take 2.5 mg by mouth 2 (two) times daily.  12/13/15  Yes Zannie Cove, MD  atenolol (TENORMIN) 25 MG tablet Take 25 mg by mouth 2 (two) times daily.   Yes [provider]  Cholecalciferol (VITAMIN D3) 2000 units capsule Take 2,000 Units by mouth daily.   Yes [provider]  cycloSPORINE (RESTASIS) 0.05 % ophthalmic emulsion Place 1 drop into both eyes 2 (two) times daily.   Yes [provider]  docusate sodium (COLACE) 100 MG capsule Take 200 mg by mouth at bedtime.    Yes [provider]  fluticasone (FLONASE) 50 MCG/ACT nasal spray Place 1 spray into both nostrils daily as needed for allergies or rhinitis.   Yes [provider]  Fluticasone-Salmeterol (ADVAIR) 250-50 MCG/DOSE AEPB Inhale 1 puff into the lungs 2 (two) times daily.   Yes [provider]  gabapentin (NEURONTIN) 300 MG capsule Take 600 mg by mouth at bedtime.  Yes [provider]   isosorbide mononitrate (IMDUR) 30 MG 24 hr tablet Take 30 mg by mouth at bedtime.    Yes [provider]  losartan (COZAAR) 100 MG tablet Take 100 mg by mouth at bedtime.   Yes [provider]  metFORMIN (GLUCOPHAGE) 500 MG tablet Take 1,000 mg by mouth 2 (two) times daily with a meal.   Yes [provider]  mycophenolate (CELLCEPT) 500 MG tablet Take 1,000 mg by mouth 2 (two) times daily. 04/11/20  Yes [provider]  nitroGLYCERIN (NITROSTAT) 0.4 MG SL tablet Place 0.4 mg under the tongue every 5 (five) minutes as needed for chest pain.   Yes [provider]  Omega-3 Fatty Acids (FISH OIL PO) Take 1,400 mg by mouth daily.   Yes [provider]  PARoxetine (PAXIL) 20 MG tablet Take 10 mg by mouth 2 (two) times daily.    Yes [provider]  sitaGLIPtin (JANUVIA) 100 MG tablet Take 100 mg by mouth daily with supper.    Yes [provider]  zolpidem (AMBIEN) 10 MG tablet Take 5 mg by mouth at bedtime.    Yes [provider]  HYDROcodone-homatropine (HYCODAN) 5-1.5 MG/5ML syrup Take 5 mLs by mouth every 6 (six) hours as needed for cough. Patient not taking: Reported on 05/26/2016 12/13/15   Zannie CoveJoseph, Preetha, MD  OXYGEN Inhale 2 L into the lungs as needed (shortness of breath/ low oxygen stats).    [provider]  traMADol (ULTRAM) 50 MG tablet Take 1 tablet (50 mg total) by mouth every 6 (six) hours as needed (mild pain). Patient not taking: Reported on 05/26/2016 05/11/16   Ardelle BallsZimmerman, Donielle M, PA-C     Critical care time: n/a    Canary BrimBrandi Talise Sligh, MSN, NP-C [facility] 04/24/2020, 10:25 AM   Please see [email or web address] for pager details.

## 2020-04-24 NOTE — H&P (Addendum)
History and Physical    Stacy Contreras IRC:[id number] DOB: 11/08/1940 DOA: 04/23/2020  PCP: Earney Mallet, MD   Patient coming from: Home  I have personally briefly reviewed patient's old medical records in [place]  Chief Complaint:   HPI: Stacy Contreras is a 79 y.o. 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  Patient reports generalized malaise, body aches followed by fever 103.6 chills and mostly dry cough.  Bought a home kit to check for COVID and was positive.  [facility] was [facility].   She reports worsening dyspnea with persistent cough and wheezing. Increased O2 requirement to 2-3LNC. Called PCP who advised to come to ED.   No CP, palpitation, orthopnea or PND.   She is fully vaccinated with Moderna.   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  No h/o smoking, etoh or street drugs.     ED Course: 140/111, pulse 87, RR 22-26, SPO2 83-92% requiring 2 L nasal cannula T-max 98.4  -CXR with lower lobe opacities consistent with Covid infection. -EKG with NSR at 70, normal axis, no significant ST-T wave changes.  Intervals within normal limits.  -Labs with -CRP 16.8 -Lactic acid 1.4 -D-dimer 0.41 -Procalcitonin less than 0.10 -LDH 201 -Ferritin 229 -Fibrinogen 596 -Covid PCR positive -WBC 7.5, Hb 12.2, platelets 225 -Sodium 131, potassium 4.2, BUN 11, creatinine 0.99 -Albumin 3.6, AST 28, ALT 25, ALP 49   Review of Systems: As per HPI otherwise 10 point review of systems negative.  Review of Systems  Constitutional: Positive for chills, fever and malaise/fatigue. Negative for diaphoresis and weight loss.  HENT: Negative.   Eyes: Negative.   Respiratory: Positive for cough  and shortness of breath. Negative for hemoptysis, sputum production and wheezing.   Cardiovascular: Negative.   Gastrointestinal: Negative.   Genitourinary: Negative.   Musculoskeletal: Positive for myalgias.  Skin: Negative.   Neurological: Negative.   Endo/Heme/Allergies: Negative.   Psychiatric/Behavioral: Negative.     Past Medical History:  Diagnosis Date  . Arthritis   . Asthma   . Coronary artery disease   . Diabetes mellitus without complication ([place])   . DVT (deep venous thrombosis) ([place])   . GERD (gastroesophageal reflux disease)   . History of breast cancer   . Hypertension   . Interstitial lung disease ([place])   . Kidney stones   . Shortness of breath dyspnea     Past Surgical History:  Procedure Laterality Date  . APPENDECTOMY    . CARDIAC CATHETERIZATION    . CHOLECYSTECTOMY    . COLONOSCOPY    . CORONARY ANGIOPLASTY    . ESOPHAGOGASTRODUODENOSCOPY    . EYE SURGERY     cataracts  . hysterectomy    . left knee replacement    . LUNG BIOPSY Right 05/08/2016   Procedure: LUNG BIOPSY;  Surgeon: Melrose Nakayama, MD;  Location: [facility];  Service: Thoracic;  Laterality: Right;  . right breast lumpectomy    . stents x 2    . VIDEO ASSISTED THORACOSCOPY Right 05/08/2016   Procedure: VIDEO ASSISTED THORACOSCOPY;  Surgeon: Melrose Nakayama, MD;  Location: [facility];  Service: Thoracic;  Laterality: Right;  Marland Kitchen VIDEO BRONCHOSCOPY  Bilateral 12/19/2015   Procedure: VIDEO BRONCHOSCOPY WITHOUT FLUORO;  Surgeon: Brand Males, MD;  Location: [facility];  Service: Endoscopy;  Laterality: Bilateral;  . VIDEO BRONCHOSCOPY N/A 05/08/2016   Procedure: VIDEO BRONCHOSCOPY;  Surgeon: Melrose Nakayama, MD;  Location: [facility];  Service: Thoracic;  Laterality: N/A;     reports that she has never smoked. She has never used smokeless tobacco. She reports current alcohol use. She reports that she does not use drugs.  Allergies  Allergen Reactions  . Codeine Other (See Comments)      Fast heart beat (tolerates hydrocodone)  . Statins Other (See Comments)    Muscle weakness  . Sulfa Antibiotics Rash    Family History  Problem Relation Age of Onset  . Lung disease Mother        ? brown lung  . Colon cancer Father   . Breast cancer Sister   . Breast cancer Daughter      Prior to Admission medications   Medication Sig Start Date End Date Taking? Authorizing Provider  acetaminophen (TYLENOL) 500 MG tablet Take 1,000 mg by mouth 2 (two) times daily as needed for headache (pain).    [provider]  albuterol (PROVENTIL HFA;VENTOLIN HFA) 108 (90 Base) MCG/ACT inhaler Inhale 2 puffs into the lungs every 4 (four) hours as needed for wheezing or shortness of breath.    [provider]  amLODipine (NORVASC) 5 MG tablet Take 5 mg by mouth at bedtime.     [provider]  apixaban (ELIQUIS) 5 MG TABS tablet Take 0.5 tablets (2.5 mg total) by mouth 2 (two) times daily. Hold on Eliquis the day prior to Bronchoscopy Patient taking differently: Take 2.5 mg by mouth 2 (two) times daily.  12/13/15   Domenic Polite, MD  aspirin EC 81 MG tablet Take 81 mg by mouth daily.    [provider]  atenolol (TENORMIN) 25 MG tablet Take 25 mg by mouth 2 (two) times daily.    [provider]  Black Cohosh 40 MG CAPS Take 120 mg by mouth 2 (two) times daily.    [provider]  Cholecalciferol (VITAMIN D3) 2000 units capsule Take 2,000 Units by mouth daily.    [provider]  cycloSPORINE (RESTASIS) 0.05 % ophthalmic emulsion Place 1 drop into both eyes 2 (two) times daily.    [provider]  dexlansoprazole (DEXILANT) 60 MG capsule Take 60 mg by mouth at bedtime.     [provider]  docusate sodium (COLACE) 100 MG capsule Take 200 mg by mouth at bedtime.     [provider]  fluticasone (FLONASE) 50 MCG/ACT nasal spray Place 1 spray into both nostrils daily as needed for allergies or rhinitis.     [provider]  Fluticasone-Salmeterol (ADVAIR) 250-50 MCG/DOSE AEPB Inhale 1 puff into the lungs 2 (two) times daily.    [provider]  gabapentin (NEURONTIN) 300 MG capsule Take 600 mg by mouth at bedtime.     [provider]  HYDROcodone-homatropine (HYCODAN) 5-1.5 MG/5ML syrup Take 5 mLs by mouth every 6 (six) hours as needed for cough. Patient not taking: Reported on 05/26/2016 12/13/15   Domenic Polite, MD  isosorbide mononitrate (IMDUR) 30 MG 24 hr tablet Take 30 mg by mouth at bedtime.     [provider]  losartan (COZAAR) 100 MG tablet Take 100 mg by mouth at bedtime.    [provider]  lubiprostone (AMITIZA) 24 MCG capsule Take 24 mcg  by mouth 2 (two) times daily with a meal.    [provider]  metFORMIN (GLUCOPHAGE) 500 MG tablet Take 1,000 mg by mouth 2 (two) times daily with a meal.    [provider]  nitroGLYCERIN (NITROSTAT) 0.4 MG SL tablet Place 0.4 mg under the tongue every 5 (five) minutes as needed for chest pain.    [provider]  Omega-3 Fatty Acids (FISH OIL PO) Take 1,400 mg by mouth daily.    [provider]  OXYGEN Inhale 2 L into the lungs as needed (shortness of breath/ low oxygen stats).    [provider]  PARoxetine (PAXIL) 20 MG tablet Take 10 mg by mouth 2 (two) times daily.     [provider]  sitaGLIPtin (JANUVIA) 100 MG tablet Take 100 mg by mouth daily with supper.     [provider]  traMADol (ULTRAM) 50 MG tablet Take 1 tablet (50 mg total) by mouth every 6 (six) hours as needed (mild pain). Patient not taking: Reported on 05/26/2016 05/11/16   Nani Skillern, PA-C  zolpidem (AMBIEN) 10 MG tablet Take 10 mg by mouth at bedtime.     [provider]    Physical Exam: Vitals:   04/23/20 2300 04/23/20 2315 04/23/20 2336 04/24/20 0015  BP: (!) 150/69 (!) 151/61 (!) 108/56 (!) 149/61  Pulse: 69 69 69 83  Resp: (!) 26 (!) 24 20  (!) 26  Temp:      TempSrc:      SpO2: 97% 99% 98% 95%  Weight:      Height:        Constitutional: NAD, calm, comfortable Vitals:   04/23/20 2300 04/23/20 2315 04/23/20 2336 04/24/20 0015  BP: (!) 150/69 (!) 151/61 (!) 108/56 (!) 149/61  Pulse: 69 69 69 83  Resp: (!) 26 (!) 24 20 (!) 26  Temp:      TempSrc:      SpO2: 97% 99% 98% 95%  Weight:      Height:       Eyes: PERRL, lids and conjunctivae normal ENMT: Mucous membranes are moist. Posterior pharynx clear of any exudate or lesions.Normal dentition.  Neck: normal, supple, no masses, no thyromegaly Respiratory: clear to auscultation bilaterally, no wheezing, no crackles. Normal respiratory effort. No accessory muscle use.  Cardiovascular: Regular rate and rhythm, no murmurs / rubs / gallops. No extremity edema. 2+ pedal pulses. No carotid bruits.  Abdomen: no tenderness, no masses palpated. No hepatosplenomegaly. Bowel sounds positive.  Musculoskeletal: no clubbing / cyanosis. No joint deformity upper and lower extremities. Good ROM, no contractures. Normal muscle tone.  Skin: no rashes, lesions, ulcers. No induration Neurologic: CN 2-12 grossly intact. Sensation intact, DTR normal. Strength 5/5 in all 4.  Psychiatric: Normal judgment and insight. Alert and oriented x 3. Normal mood.   Labs on Admission: I have personally reviewed following labs and imaging studies  CBC: Recent Labs  Lab 04/23/20 2313  WBC 7.5  NEUTROABS 6.9  HGB 12.2  HCT 36.9  MCV 93.9  PLT 237   Basic Metabolic Panel: Recent Labs  Lab 04/23/20 2313  NA 131*  K 4.2  CL 98  CO2 24  GLUCOSE 216*  BUN 11  CREATININE 0.99  CALCIUM 8.5*   GFR: Estimated Creatinine Clearance: 42.8 mL/min (by C-G formula based on SCr of 0.99 mg/dL). Liver Function Tests: Recent Labs  Lab 04/23/20 2313  AST 28  ALT 25  ALKPHOS 49  BILITOT 0.6  PROT  7.1  ALBUMIN 3.6   No results for input(s): LIPASE, AMYLASE in the last 168 hours. No results for  input(s): AMMONIA in the last 168 hours. Coagulation Profile: No results for input(s): INR, PROTIME in the last 168 hours. Cardiac Enzymes: No results for input(s): CKTOTAL, CKMB, CKMBINDEX, TROPONINI in the last 168 hours. BNP (last 3 results) No results for input(s): PROBNP in the last 8760 hours. HbA1C: No results for input(s): HGBA1C in the last 72 hours. CBG: No results for input(s): GLUCAP in the last 168 hours. Lipid Profile: Recent Labs    04/23/20 2313  TRIG 90   Thyroid Function Tests: No results for input(s): TSH, T4TOTAL, FREET4, T3FREE, THYROIDAB in the last 72 hours. Anemia Panel: Recent Labs    04/23/20 2313  FERRITIN 229   Urine analysis:    Component Value Date/Time   COLORURINE YELLOW 05/06/2016 1340   APPEARANCEUR CLOUDY (A) 05/06/2016 1340   LABSPEC 1.017 05/06/2016 1340   PHURINE 5.5 05/06/2016 1340   GLUCOSEU NEGATIVE 05/06/2016 1340   HGBUR NEGATIVE 05/06/2016 1340   BILIRUBINUR NEGATIVE 05/06/2016 1340   KETONESUR NEGATIVE 05/06/2016 1340   PROTEINUR NEGATIVE 05/06/2016 1340   NITRITE NEGATIVE 05/06/2016 1340   LEUKOCYTESUR NEGATIVE 05/06/2016 1340    Radiological Exams on Admission: DG Chest 2 View  Result Date: 04/23/2020 CLINICAL DATA:  Shortness of breath, COVID positive, history of interstitial lung disease. History of breast cancer. Diabetes. History of DVT. History of asthma. EXAM: CHEST - 2 VIEW COMPARISON:  Chest x-ray 05/26/2016. CT chest 01/20/2016 FINDINGS: The heart size and mediastinal contours are within normal limits. Aortic arch calcifications. Coronary artery stent in similar position. Redemonstration of right mid and lower lung zone pulmonary staples. Interval development of bilateral lower lung zone patchy airspace opacities. Persistent biapical pleural/pulmonary scarring. Suggestion of a right upper lobe granuloma. No pulmonary edema. No pleural effusion. No pneumothorax. No acute osseous abnormality. Surgical clips overlie the  upper abdomen. IMPRESSION: Bilateral lower lung zone pulmonary findings consistent with known COVID 19 infection. Electronically Signed   By: Iven Finn M.D.   On: 04/23/2020 20:21    EKG: Independently reviewed. NSR @ 70 bom, normal axis, no ST T changes.    Assessment/Plan Active Problems:   SOB (shortness of breath)   Coronary artery disease   Diabetes mellitus without complication (HCC)   Hypertension   DVT (deep venous thrombosis) (HCC)   ILD (interstitial lung disease) (HCC)   Interstitial lung disorders (HCC)   Acute respiratory distress syndrome (ARDS) due to COVID-19 virus ([place])   Pneumonia due to COVID-19 virus  #COVID PNA with hypoxic respiratory failure -Continue with remdesivir and Decadron. -Based on inflammatory markers she does not qualify for Actemra at this time.  -I have withheld her CellCept that she has been taking as a trial for her interstitial lung disease with autoimmune component.  She will be on Decadron. Pulmonary consult pending.   #HTN -Continue with Norvasc #DVT -Continue with outpatient Eliquis.  #CAD -Continue with ASA, atenolol, Imdur  #DM -Hold Metformin and Januvia.   DVT prophylaxis: Patient on Eliquis for history of DVT.    Code Status: Full code   Family Communication: None.    Disposition Plan: Home.    Consults called: None. Admission status: Inpatient on telemetry.   Garvin Ellena MD [facility]   If 7PM-7AM, please contact night-coverage [email or web address] Password [facility]  04/24/2020, 12:33 AM

## 2020-04-24 NOTE — ED Notes (Signed)
Breakfast tray was given to the patient and is currently eating.

## 2020-04-24 NOTE — ED Provider Notes (Signed)
Patient is a 79 year old female, positive for Covid, hypoxic on arrival to the emergency department to the mid to low 80s. Her O2 sat drops with exertion. She does however maintain greater than 90% O2 sat with 1 L nasal cannula. She has been started on remdesivir and Decadron.  Appreciate Hospitalist, Dr. Earney Mallet, for admitting the patient.   Roxy Horseman, PA-C 04/24/20 0041    Shon Baton, MD 04/24/20 [phone]

## 2020-04-25 LAB — COMPREHENSIVE METABOLIC PANEL
ALT: 21 U/L (ref 0–44)
AST: 25 U/L (ref 15–41)
Albumin: 3 g/dL — ABNORMAL LOW (ref 3.5–5.0)
Alkaline Phosphatase: 46 U/L (ref 38–126)
Anion gap: 10 (ref 5–15)
BUN: 14 mg/dL (ref 8–23)
CO2: 24 mmol/L (ref 22–32)
Calcium: 7.8 mg/dL — ABNORMAL LOW (ref 8.9–10.3)
Chloride: 101 mmol/L (ref 98–111)
Creatinine, Ser: 0.84 mg/dL (ref 0.44–1.00)
GFR calc Af Amer: >60 mL/min (ref >60)
GFR calc non Af Amer: >60 mL/min (ref >60)
Glucose, Bld: 153 mg/dL — ABNORMAL HIGH (ref 70–99)
Potassium: 3.5 mmol/L (ref 3.5–5.1)
Sodium: 135 mmol/L (ref 135–145)
Total Bilirubin: 0.6 mg/dL (ref 0.3–1.2)
Total Protein: 6.2 g/dL — ABNORMAL LOW (ref 6.5–8.1)

## 2020-04-25 LAB — CBC
HCT: 35.3 % — ABNORMAL LOW (ref 36.0–46.0)
Hemoglobin: 11.7 g/dL — ABNORMAL LOW (ref 12.0–15.0)
MCH: 31 pg (ref 26.0–34.0)
MCHC: 33.1 g/dL (ref 30.0–36.0)
MCV: 93.4 fL (ref 80.0–100.0)
Platelets: 269 10*3/uL (ref 150–400)
RBC: 3.78 MIL/uL — ABNORMAL LOW (ref 3.87–5.11)
RDW: 13.1 % (ref 11.5–15.5)
WBC: 7.6 10*3/uL (ref 4.0–10.5)
nRBC: 0 % (ref 0.0–0.2)

## 2020-04-25 LAB — PROTIME-INR
INR: 1.3 — ABNORMAL HIGH (ref 0.8–1.2)
Prothrombin Time: 16 seconds — ABNORMAL HIGH (ref 11.4–15.2)

## 2020-04-25 LAB — GLUCOSE, CAPILLARY: Glucose-Capillary: 261 mg/dL — ABNORMAL HIGH (ref 70–99)

## 2020-04-25 LAB — CBG MONITORING, ED
Glucose-Capillary: 135 mg/dL — ABNORMAL HIGH (ref 70–99)
Glucose-Capillary: 173 mg/dL — ABNORMAL HIGH (ref 70–99)
Glucose-Capillary: 205 mg/dL — ABNORMAL HIGH (ref 70–99)

## 2020-04-25 MED ORDER — SULFAMETHOXAZOLE-TRIMETHOPRIM 800-160 MG PO TABS
1.0000 | ORAL_TABLET | ORAL | Status: DC
  Administered 2020-04-26: 1 via ORAL
  Filled 2020-04-25: qty 1

## 2020-04-25 MED ORDER — METHYLPREDNISOLONE SODIUM SUCC 125 MG IJ SOLR
60.0000 mg | Freq: Two times a day (BID) | INTRAMUSCULAR | Status: DC
  Administered 2020-04-25 – 2020-04-28 (×7): 60 mg via INTRAVENOUS
  Filled 2020-04-25 (×7): qty 2

## 2020-04-25 MED ORDER — BARICITINIB 2 MG PO TABS
4.0000 mg | ORAL_TABLET | Freq: Every day | ORAL | Status: DC
  Administered 2020-04-25 – 2020-04-29 (×5): 4 mg via ORAL
  Filled 2020-04-25 (×5): qty 2

## 2020-04-25 MED ORDER — POTASSIUM CHLORIDE CRYS ER 20 MEQ PO TBCR
40.0000 meq | EXTENDED_RELEASE_TABLET | Freq: Once | ORAL | Status: AC
  Administered 2020-04-25: 40 meq via ORAL
  Filled 2020-04-25: qty 2

## 2020-04-25 MED ORDER — FUROSEMIDE 10 MG/ML IJ SOLN
20.0000 mg | Freq: Two times a day (BID) | INTRAMUSCULAR | Status: AC
  Administered 2020-04-25 (×2): 20 mg via INTRAVENOUS
  Filled 2020-04-25 (×2): qty 4

## 2020-04-25 NOTE — ED Notes (Addendum)
Rounded on pt to assist with toileting and comfort measures. Pt appears to be confused at this time. Pt able to state name, dob, and day of the week, but is disoriented to the year and place. Pt currently thinks that it is the year 2019 and that she is at home. This nurse attempted to contact pts primary contact, her husband, for more insight on pts baseline but no answer at this time. Will try again. Informed charge

## 2020-04-25 NOTE — Progress Notes (Addendum)
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  Patient reports generalized malaise, body aches followed by fever103.6chills andmostly drycough.  Bought a home kit to check for COVID and was positive.  [facility].  She reportsworseningdyspneawith persistent cough and wheezing. Increased O2 requirement to 2-3LNC. Called PCP who advised to come to ED.  No CP, palpitation, orthopnea or PND.  She is fully vaccinatedwith Moderna.   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  No h/o smoking, etoh or street drugs.   ED Course: 140/111, pulse 87, RR 22-26, SPO2 83-92% requiring 2 L nasal cannula T-max 98.4  -CXR with lower lobe opacities consistent with Covid infection. -EKG with NSR at 70, normal axis, no significant ST-T wave changes. Intervals within normal limits.  -Labs with -CRP 16.8 -Lactic acid 1.4 -D-dimer 0.41 -Procalcitonin less than 0.10 -LDH 201 -Ferritin 229 -Fibrinogen 596 -Covid PCR positive -WBC 7.5, Hb 12.2, platelets 225 -Sodium 131, potassium 4.2, BUN 11, creatinine 0.99 -Albumin 3.6, AST 28, ALT 25, ALP 49.  04/24/20: Seen and examined at bedside in ED.  Reports persistent nonproductive cough.  Breathing is improving.  Only 1 to 2 L oxygen supplementation at baseline currently requiring 3 L.  Independently reviewed chest x-ray done on admission which shows mild bilateral pulmonary infiltrates suggestive of COVID-19 viral pneumonia.  She was started on COVID-19 directed therapies, IV  Decadron and remdesivir.  Pulmonary consulted and following.  Appreciate specialists assistance.   04/25/20: Seen and examined at bedside.  Reports worsening dyspnea and persistent dry cough.  Oxygen requirement increased to 5 L from 3 L.  She is alert oriented x3.  Added baricitinib with patient's own consent.  Switched Decadron to IV Solu-Medrol.   Assessment/Plan: Active Problems:   SOB (shortness of breath)   Coronary artery disease   Diabetes mellitus without complication (HCC)   Hypertension   DVT (deep venous thrombosis) (HCC)   ILD (interstitial lung disease) (HCC)   Interstitial lung disorders (HCC)   Acute respiratory distress syndrome (ARDS) due to COVID-19 virus ([place])   Pneumonia due to COVID-19 virus   COVID-19 viral pneumonia Positive COVID-19 test on 2020-04-23. Started on COVID-19 directed therapies, remdesivir day #3/5 Added baricitinib with patient's own consent.  Switched Decadron to IV Solu-Medrol. Vitamin D3, zinc, zinc Bronchodilators, albuterol inhaler 2 puffs every 6 hours, Atrovent inhaler 2 puffs every 6 hours Antitussives Incentive spirometer, flutter valve, mobilize as tolerated Pronation or side laying Monitor inflammatory markers daily Maintain O2 saturation greater than 92%.  Acute on chronic hypoxic respiratory failure secondary to COVID-19 viral pneumonia On 1 to 2 L nasal cannula at baseline. Currently requiring 5 L to maintain O2 saturation greater than 92% Add 2 doses of IV Lasix 20 mg twice daily x2. Continue to closely monitor  Interstitial lung disease/pleuropulmonary fibroelastosis Followed by pulmonary outpatient Pulmonary consulted and following.  Appreciate assistance. Pulmonary recommended to continue to hold CellCept due to risks of further infections. She is on Bactrim prophylactically Monday Wednesday Friday, continue.  Type 2 diabetes with hyperglycemia, likely exacerbated by steroids Hemoglobin A1c 6.7 on 2020-01-23 Hold off  oral hypoglycemics Continue insulin sliding scale  History of DVT/PE Continue Eliquis  Essential hypertension BP stable Continue home antihypertensives  GERD Table Continue PPI  Chronic anxiety/depression Stable continue Paxil  Polyneuropathy Stable continue gabapentin     Code Status: Full code  Family Communication: None at bedside  Disposition Plan: Home   Consultants:  Pulmonary  Procedures:  None  Antimicrobials:  None  DVT prophylaxis: Eliquis  Status is: Inpatient    Dispo:  Patient From: Home  Planned Disposition: Home  Expected discharge date: 04/26/20  Medically stable for discharge: No, ongoing management of COVID-19 viral pneumonia.         Objective: Vitals:   04/25/20 1241 04/25/20 1245 04/25/20 1330 04/25/20 1708  BP: 107/84 (!) 133/51 (!) 134/51 (!) 129/59  Pulse: 65 67 63 63  Resp: 16   (!) 24  Temp:      TempSrc:      SpO2: 94% 93% 95% 95%  Weight:      Height:       No intake or output data in the 24 hours ending 04/25/20 1717 Filed Weights   04/23/20 2226  Weight: 68 kg    Exam:  . General: 79 y.o. year-old female well developed well nourished in no acute distress.  Alert and oriented x3. . Cardiovascular: Regular rate and rhythm with no rubs or gallops.  No thyromegaly or JVD noted.   Marland Kitchen Respiratory: Rales at bases.  Mild wheezing noted.  Poor inspiratory effort.  Abdomen: Soft nontender nondistended with normal bowel sounds x4 quadrants. . Musculoskeletal: Trace lower extremity edema bilaterally. Marland Kitchen Psychiatry: Mood is appropriate for condition and setting   Data Reviewed: CBC: Recent Labs  Lab 04/23/20 2313 04/24/20 0316 04/25/20 0300  WBC 7.5 9.1 7.6  NEUTROABS 6.9  --   --   HGB 12.2 12.3 11.7*  HCT 36.9 35.6* 35.3*  MCV 93.9 92.0 93.4  PLT 225 245 945   Basic Metabolic Panel: Recent Labs  Lab 04/23/20 2313 04/24/20 0316 04/25/20 0300  NA 131* 130* 135  K 4.2 4.0 3.5  CL 98 98 101    CO2 24 22 24   GLUCOSE 216* 229* 153*  BUN 11 9 14   CREATININE 0.99 0.81 0.84  CALCIUM 8.5* 8.3* 7.8*   GFR: Estimated Creatinine Clearance: 50.5 mL/min (by C-G formula based on SCr of 0.84 mg/dL). Liver Function Tests: Recent Labs  Lab 04/23/20 2313 04/24/20 0316 04/25/20 0300  AST 28 32 25  ALT 25 26 21   ALKPHOS 49 52 46  BILITOT 0.6 0.5 0.6  PROT 7.1 7.4 6.2*  ALBUMIN 3.6 3.6 3.0*   No results for input(s): LIPASE, AMYLASE in the last 168 hours. No results for input(s): AMMONIA in the last 168 hours. Coagulation Profile: Recent Labs  Lab 04/24/20 0316 04/25/20 0300  INR 1.0 1.3*   Cardiac Enzymes: No results for input(s): CKTOTAL, CKMB, CKMBINDEX, TROPONINI in the last 168 hours. BNP (last 3 results) No results for input(s): PROBNP in the last 8760 hours. HbA1C: Recent Labs    04/24/20 0316  HGBA1C 6.7*   CBG: Recent Labs  Lab 04/24/20 1750 04/24/20 2237 04/25/20 0743 04/25/20 1348 04/25/20 1707  GLUCAP 113* 130* 135* 173* 205*   Lipid Profile: Recent Labs    04/23/20 2313  TRIG 90   Thyroid Function Tests: No results for input(s): TSH, T4TOTAL, FREET4, T3FREE, THYROIDAB in the last 72 hours. Anemia Panel: Recent Labs    04/23/20 2313  FERRITIN 229   Urine  analysis:    Component Value Date/Time   COLORURINE YELLOW 05/06/2016 1340   APPEARANCEUR CLOUDY (A) 05/06/2016 1340   LABSPEC 1.017 05/06/2016 1340   PHURINE 5.5 05/06/2016 1340   GLUCOSEU NEGATIVE 05/06/2016 1340   HGBUR NEGATIVE 05/06/2016 1340   BILIRUBINUR NEGATIVE 05/06/2016 1340   KETONESUR NEGATIVE 05/06/2016 1340   PROTEINUR NEGATIVE 05/06/2016 1340   NITRITE NEGATIVE 05/06/2016 1340   LEUKOCYTESUR NEGATIVE 05/06/2016 1340   Sepsis Labs: @LABRCNTIP (procalcitonin:4,lacticidven:4)  ) Recent Results (from the past 240 hour(s))  SARS Coronavirus 2 by RT PCR (hospital order, performed in [place] hospital lab) Nasopharyngeal Nasopharyngeal Swab     Status: Abnormal    Collection Time: 04/23/20 11:00 PM   Specimen: Nasopharyngeal Swab  Result Value Ref Range Status   SARS Coronavirus 2 POSITIVE (A) NEGATIVE Final    Comment: RESULT CALLED TO, READ BACK BY AND VERIFIED WITH: RN J SMITH AT 0010 04/24/20 CRUICKSHANK A (NOTE) SARS-CoV-2 target nucleic acids are DETECTED  SARS-CoV-2 RNA is generally detectable in upper respiratory specimens  during the acute phase of infection.  Positive results are indicative  of the presence of the identified virus, but do not rule out bacterial infection or co-infection with other pathogens not detected by the test.  Clinical correlation with patient history and  other diagnostic information is necessary to determine patient infection status.  The expected result is negative.  Fact Sheet for Patients:   StrictlyIdeas.no   Fact Sheet for Healthcare Providers:   BankingDealers.co.za    This test is not yet approved or cleared by the [place] FDA and  has been authorized for detection and/or diagnosis of SARS-CoV-2 by FDA under an Emergency Use Authorization (EUA).  This EUA will remain in effect (meaning t his test can be used) for the duration of  the COVID-19 declaration under Section 564(b)(1) of the Act, 21 U.S.C. section 360-bbb-3(b)(1), unless the authorization is terminated or revoked sooner.  Performed at [facility], [place] [address], [place], [place] [address]   Blood Culture (routine x 2)     Status: None (Preliminary result)   Collection Time: 04/23/20 11:13 PM   Specimen: BLOOD  Result Value Ref Range Status   Specimen Description   Final    BLOOD BLOOD LEFT FOREARM Performed at [facility] [address], [place], [place] [address]    Special Requests   Final    BOTTLES DRAWN AEROBIC AND ANAEROBIC Blood Culture adequate volume Performed at [facility] [address] [place],  [place] [address]    Culture   Final    NO GROWTH 1 DAY Performed at [facility], [place] [address] [place], [place] [address]    Report Status PENDING  Incomplete  Blood Culture (routine x 2)     Status: None (Preliminary result)   Collection Time: 04/23/20 11:13 PM   Specimen: BLOOD  Result Value Ref Range Status   Specimen Description   Final    BLOOD RIGHT ANTECUBITAL Performed at [facility] [address] [place], [place] [address]    Special Requests   Final    BOTTLES DRAWN AEROBIC AND ANAEROBIC Blood Culture adequate volume Performed at [facility] [address], [place], [place] [address]    Culture   Final    NO GROWTH 1 DAY Performed at [facility], [place] [address] [place], [place] [address]    Report Status PENDING  Incomplete  Studies: No results found.  Scheduled Meds: . albuterol  2 puff Inhalation Q6H  . amLODipine  5 mg Oral QHS  . apixaban  2.5 mg Oral BID  . vitamin C  500 mg Oral Daily  . atenolol  25 mg Oral BID  . baricitinib  4 mg Oral Daily  . cholecalciferol  1,000 Units Oral Daily  . docusate sodium  200 mg Oral QHS  . furosemide  20 mg Intravenous BID  . gabapentin  600 mg Oral QHS  . guaiFENesin  600 mg Oral BID  . insulin aspart  0-15 Units Subcutaneous TID WC  . ipratropium  2 puff Inhalation Q6H  . isosorbide mononitrate  30 mg Oral QHS  . losartan  100 mg Oral QHS  . methylPREDNISolone (SOLU-MEDROL) injection  60 mg Intravenous Q12H  . pantoprazole  40 mg Oral Q1200  . PARoxetine  10 mg Oral BID  . zinc sulfate  220 mg Oral Daily  . zolpidem  5 mg Oral QHS    Continuous Infusions: . remdesivir 100 mg in NS 100 mL Stopped (04/25/20 1349)     LOS: 1 day     Kayleen Memos, MD [facility] Pager [phone]  If 7PM-7AM, please contact night-coverage [email or web address] Password [facility] 04/25/2020, 5:17 PM

## 2020-04-26 LAB — COMPREHENSIVE METABOLIC PANEL
ALT: 21 U/L (ref 0–44)
AST: 24 U/L (ref 15–41)
Albumin: 3.3 g/dL — ABNORMAL LOW (ref 3.5–5.0)
Alkaline Phosphatase: 49 U/L (ref 38–126)
Anion gap: 11 (ref 5–15)
BUN: 19 mg/dL (ref 8–23)
CO2: 23 mmol/L (ref 22–32)
Calcium: 8.4 mg/dL — ABNORMAL LOW (ref 8.9–10.3)
Chloride: 98 mmol/L (ref 98–111)
Creatinine, Ser: 0.76 mg/dL (ref 0.44–1.00)
GFR calc Af Amer: >60 mL/min (ref >60)
GFR calc non Af Amer: >60 mL/min (ref >60)
Glucose, Bld: 230 mg/dL — ABNORMAL HIGH (ref 70–99)
Potassium: 4.5 mmol/L (ref 3.5–5.1)
Sodium: 132 mmol/L — ABNORMAL LOW (ref 135–145)
Total Bilirubin: 0.4 mg/dL (ref 0.3–1.2)
Total Protein: 6.8 g/dL (ref 6.5–8.1)

## 2020-04-26 LAB — GLUCOSE, CAPILLARY
Glucose-Capillary: 222 mg/dL — ABNORMAL HIGH (ref 70–99)
Glucose-Capillary: 157 mg/dL — ABNORMAL HIGH (ref 70–99)
Glucose-Capillary: 149 mg/dL — ABNORMAL HIGH (ref 70–99)
Glucose-Capillary: 184 mg/dL — ABNORMAL HIGH (ref 70–99)

## 2020-04-26 LAB — CBC
HCT: 35.4 % — ABNORMAL LOW (ref 36.0–46.0)
Hemoglobin: 11.8 g/dL — ABNORMAL LOW (ref 12.0–15.0)
MCH: 31.1 pg (ref 26.0–34.0)
MCHC: 33.3 g/dL (ref 30.0–36.0)
MCV: 93.2 fL (ref 80.0–100.0)
Platelets: 313 10*3/uL (ref 150–400)
RBC: 3.8 MIL/uL — ABNORMAL LOW (ref 3.87–5.11)
RDW: 12.8 % (ref 11.5–15.5)
WBC: 5 10*3/uL (ref 4.0–10.5)
nRBC: 0 % (ref 0.0–0.2)

## 2020-04-26 LAB — PROTIME-INR
INR: 1.4 — ABNORMAL HIGH (ref 0.8–1.2)
Prothrombin Time: 16.4 seconds — ABNORMAL HIGH (ref 11.4–15.2)

## 2020-04-26 MED ORDER — INSULIN ASPART 100 UNIT/ML ~~LOC~~ SOLN
2.0000 [IU] | Freq: Three times a day (TID) | SUBCUTANEOUS | Status: DC
  Administered 2020-04-26 – 2020-04-29 (×7): 2 [IU] via SUBCUTANEOUS

## 2020-04-26 MED ORDER — ATENOLOL 25 MG PO TABS
12.5000 mg | ORAL_TABLET | Freq: Two times a day (BID) | ORAL | Status: DC
  Administered 2020-04-26 – 2020-04-29 (×5): 12.5 mg via ORAL
  Filled 2020-04-26 (×6): qty 1

## 2020-04-26 MED ORDER — ISOSORBIDE MONONITRATE ER 30 MG PO TB24
15.0000 mg | ORAL_TABLET | Freq: Every day | ORAL | Status: DC
  Administered 2020-04-26 – 2020-04-28 (×3): 15 mg via ORAL
  Filled 2020-04-26 (×3): qty 1

## 2020-04-26 NOTE — Progress Notes (Signed)
Yellow mews Patient triggered yellow mews due to blood pressure 98/59 and RR 22. Patient is A&O x4, she is in no distress. Notified charge nurse Loman Chroman, RN. Will continue to monitor.   T 97.6 BP 98/59 MAP 60 RR 22 SPo2 93% on HFNC at 7L.

## 2020-04-26 NOTE — Progress Notes (Signed)
Inpatient Diabetes Program Recommendations  AACE/ADA: New Consensus Statement on Inpatient Glycemic Control (2015)  Target Ranges:  Prepandial:   less than 140 mg/dL      Peak postprandial:   less than 180 mg/dL (1-2 hours)      Critically ill patients:  140 - 180 mg/dL   Lab Results  Component Value Date   GLUCAP 157 (H) 04/26/2020   HGBA1C 6.7 (H) 04/24/2020    Review of Glycemic Control Results for Stacy Contreras, Stacy Contreras (MRN [id number]) as of 04/26/2020 11:55  Ref. Range 04/25/2020 13:48 04/25/2020 17:07 04/25/2020 21:08 04/26/2020 07:47 04/26/2020 11:34  Glucose-Capillary Latest Ref Range: 70 - 99 mg/dL 771 (H) 165 (H) 790 (H) 222 (H) 157 (H)   Diabetes history: DM 2 Outpatient Diabetes medications: Metformin 1000 mg bid, Januvia 100 mg Daily Current orders for Inpatient glycemic control:  Novolog 0-15 units tid  Solumedrol 60 mg Q12 hours  Inpatient Diabetes Program Recommendations:    -  Add Novolog hs scale -  Consider Novolog 2 units tid meal coverage if eating >50% of meals.  Thanks,  Christena Deem RN, MSN, BC-ADM Inpatient Diabetes Coordinator Team Pager [phone] (8a-5p)

## 2020-04-26 NOTE — Evaluation (Addendum)
Physical Therapy Evaluation Patient Details Name: Stacy Contreras MRN: [id number] DOB: October 22, 1940 Today's Date: 04/26/2020   History of Present Illness  79 y.o. female with HTN, DM type II,  DVT/PE on Eliquis, ILD/asthma, on 1-2 LNC at baseline,  breast cancer in remission, CAD s/p PCI to LAD who presented with dyspnea x 5-7days, generalize malaise, body aches, fever, chills, dry cough and covid + per home test and [facility] test.  Clinical Impression  Pt admitted with above diagnosis. Pt with good family support, independent with mobility and on 1L O2 at home PTA. Pt desat quickly with mobility while on 9L, desat to 85% with ~60 sec static standing and mild dyspnea so returned to sitting and cued for pursed lip breathing with SpO2 88-90% after 5 minutes seated rest; RN notified. Therapist educated pt on sidelying and proning when able which pt endorses. Pt endorses compliance with spirometry performance. Pt motivated to get back to PLOF and good understanding of slowly increasing activity tolerance. Pt seated EOB with SpO2 88-90% at EOS and RN in room. Pt currently with functional limitations due to the deficits listed below (see PT Problem List). Pt will benefit from skilled PT to increase their independence and safety with mobility to allow discharge to the venue listed below.       Follow Up Recommendations Home health PT;Supervision - Intermittent    Equipment Recommendations  3in1 (PT)    Recommendations for Other Services       Precautions / Restrictions Precautions Precautions: Fall Precaution Comments: monitor O2 Restrictions Weight Bearing Restrictions: No      Mobility  Bed Mobility Overal bed mobility: Needs Assistance Bed Mobility: Supine to Sit  Supine to sit: Supervision  General bed mobility comments: slow, labored movement to come to sitting EOB  Transfers Overall transfer level: Needs assistance Equipment used: None Transfers: Sit to/from Stand Sit to Stand: Min  guard  General transfer comment: slow to rise with hip extension last, BLE braced on bed, BUE assisting to power up  Ambulation/Gait  General Gait Details: not attempted- desat with static standing  Stairs            Wheelchair Mobility    Modified Rankin (Stroke Patients Only)       Balance Overall balance assessment: Needs assistance Sitting-balance support: Feet supported;No upper extremity supported Sitting balance-Leahy Scale: Good Sitting balance - Comments: seated EOB   Standing balance support: During functional activity;No upper extremity supported Standing balance-Leahy Scale: Fair Standing balance comment: static standing without UE support        Pertinent Vitals/Pain Pain Assessment: No/denies pain    Home Living Family/patient expects to be discharged to:: Private residence Living Arrangements: Spouse/significant other Available Help at Discharge: Family;Friend(s);Available 24 hours/day Type of Home: House Home Access: Stairs to enter Entrance Stairs-Rails: Right;Left (uses R side) Entrance Stairs-Number of Steps: 8 total (4 then 4) Home Layout: Laundry or work area in basement (in the process of moving laundry up to main floor) Home Equipment: Gilmer Mor - single point;Shower seat - built in;Other (comment) (1L O2 baseline)      Prior Function Level of Independence: Independent         Comments: Pt reports independent with bathing and dressing, ambulates community distances without AD, pt denies recent falls. 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 Pt reports weekly house cleaner and spouse completes laundry.     Hand Dominance        Extremity/Trunk Assessment   Upper Extremity Assessment  Upper Extremity Assessment: Overall WFL for tasks assessed    Lower Extremity Assessment Lower Extremity Assessment: Generalized weakness (AROM WNL, strength grossly 3+/5 throughout, denies numbness/tingling)    Cervical / Trunk  Assessment Cervical / Trunk Assessment: Normal  Communication   Communication: No difficulties  Cognition Arousal/Alertness: Awake/alert Behavior During Therapy: WFL for tasks assessed/performed Overall Cognitive Status: Within Functional Limits for tasks assessed  General Comments: pt A&O x4      General Comments General comments (skin integrity, edema, etc.): on 9L with SpO2 91% supine, desat to 85% with 1 min static standing and mild dyspnea, returned to sitting EOB with SpO2 88-90% and RN notified    Exercises     Assessment/Plan    PT Assessment Patient needs continued PT services  PT Problem List Decreased strength;Decreased activity tolerance;Decreased balance;Cardiopulmonary status limiting activity       PT Treatment Interventions DME instruction;Gait training;Functional mobility training;Therapeutic activities;Therapeutic exercise;Balance training;Neuromuscular re-education;Patient/family education    PT Goals (Current goals can be found in the Care Plan section)  Acute Rehab PT Goals Patient Stated Goal: return home to spouse PT Goal Formulation: With patient Time For Goal Achievement: 05/10/20 Potential to Achieve Goals: Good    Frequency Min 3X/week   Barriers to discharge        Co-evaluation               AM-PAC PT "6 Clicks" Mobility  Outcome Measure Help needed turning from your back to your side while in a flat bed without using bedrails?: None Help needed moving from lying on your back to sitting on the side of a flat bed without using bedrails?: None Help needed moving to and from a bed to a chair (including a wheelchair)?: A Little Help needed standing up from a chair using your arms (e.g., wheelchair or bedside chair)?: A Little Help needed to walk in hospital room?: A Lot Help needed climbing 3-5 steps with a railing? : A Lot 6 Click Score: 18    End of Session Equipment Utilized During Treatment: Oxygen Activity Tolerance: Patient  limited by fatigue Patient left: in bed;with call bell/phone within reach;with nursing/sitter in room Nurse Communication: Mobility status;Other (comment) (O2) PT Visit Diagnosis: Unsteadiness on feet (R26.81);Other abnormalities of gait and mobility (R26.89);Muscle weakness (generalized) (M62.81)    Time: 1355-1420 PT Time Calculation (min) (ACUTE ONLY): 25 min   Charges:   PT Evaluation $PT Eval Moderate Complexity: 1 Mod PT Treatments $Self Care/Home Management: 8-22         Tori Issachar Broady PT, DPT 04/26/20, 3:31 PM

## 2020-04-26 NOTE — Plan of Care (Signed)
°  Problem: Coping: °Goal: Level of anxiety will decrease °Outcome: Progressing °  °

## 2020-04-26 NOTE — Care Management Important Message (Signed)
Important Message  Patient Details IM Letter placed in [facility] on door for RN to present to the Patient Name: Stacy Contreras MRN: [id number] Date of Birth: 04-09-1941   Medicare Important Message Given:  Yes     Caren Macadam 04/26/2020, 9:49 AM

## 2020-04-26 NOTE — Progress Notes (Addendum)
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  Patient reports generalized malaise, body aches followed by fever103.6chills andmostly drycough.  Bought a home kit to check for COVID and was positive.  [facility] and test was [facility].  Reportsworseningdyspneawith persistent cough and wheezing. Increased O2 requirement to 2-3LNC. Called PCP who advised to come to the ED. No CP, palpitation, orthopnea or PND. She is fully vaccinatedwith Moderna.   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  No h/o smoking, etoh or street drugs.   ED Course: 140/111, pulse 87, RR 22-26, SPO2 83-92% requiring 2 L nasal cannula T-max 98.4  -CXR with lower lobe opacities consistent with Covid infection. -EKG with NSR at 70, normal axis, no significant ST-T wave changes. Intervals within normal limits.  -Labs with -CRP 16.8 -Lactic acid 1.4 -D-dimer 0.41 -Procalcitonin less than 0.10 -LDH 201 -Ferritin 229 -Fibrinogen 596 -Covid PCR positive -WBC 7.5, Hb 12.2, platelets 225 -Sodium 131, potassium 4.2, BUN 11, creatinine 0.99 -Albumin 3.6, AST 28, ALT 25, ALP 49.  Seen by pulmonary, advised to continue to hold off Cellcept.  Currently on covid 19 directed therapies, Remdesivir, Baricitinib, IV solumedrol, bronchodilators, pulmonary toilet, and O2 supplementation as needed.  04/26/20: Seen and examined.  Reports feeling very weak.  She has sutures in her right wrists that are due to be removed today.  Curb sided via secure chat with ortho hand surgery to  assist.    Assessment/Plan: Active Problems:   SOB (shortness of breath)   Coronary artery disease   Diabetes mellitus without complication (HCC)   Hypertension   DVT (deep venous thrombosis) (HCC)   ILD (interstitial lung disease) (HCC)   Interstitial lung disorders (HCC)   Acute respiratory distress syndrome (ARDS) due to COVID-19 virus ([place])   Pneumonia due to COVID-19 virus   COVID-19 viral pneumonia Positive COVID-19 test on 2020-04-23. Started on COVID-19 directed therapies, remdesivir day #4/5 Added baricitinib with patient's own consent.  Switched Decadron to IV Solu-Medrol on 04/25/2020. Vitamin D3, zinc, zinc Bronchodilators, albuterol inhaler 2 puffs every 6 hours, Atrovent inhaler 2 puffs every 6 hours Antitussives Incentive spirometer, flutter valve, mobilize as tolerated Pronation or side laying Monitor inflammatory markers daily Maintain O2 saturation greater than 92%. Currently requiring 7 L, oxygen saturation 94%.  Acute on chronic hypoxic respiratory failure secondary to COVID-19 viral pneumonia On 1 to 2 L nasal cannula at baseline. Currently requiring 5 L to maintain O2 saturation greater than 92% Added 2 doses of IV Lasix 20 mg twice daily x2. Continue to closely monitor  Essential HTN, BP has been soft Cut down dose of atenolol and Imdur due to soft Bps. Hold home norvasc Continue to monitor vital signs  Interstitial lung disease/pleuropulmonary fibroelastosis Followed by pulmonary outpatient Pulmonary consulted and following.  Appreciate assistance. Pulmonary recommended to continue to hold CellCept due to risks of further infections. She is on Bactrim prophylactically Monday Wednesday Friday, continue.  Type 2 diabetes with hyperglycemia, likely exacerbated by steroids Hemoglobin A1c 6.7 on 2020-01-23 Hold off oral hypoglycemics Continue insulin sliding scale  Right wrist with stitches, POA Stitches due to be  removed today Ortho hand  surgery contacted via secure chat to assist  History of DVT/PE Continue Eliquis  Essential hypertension BP stable Continue home antihypertensives  GERD Table Continue PPI  Chronic anxiety/depression Stable continue Paxil  Polyneuropathy Stable continue gabapentin  Physical debility PT to assess Continue PT OT with assistance and fall precautions Out of bed to chair every shift.     Code Status: Full code  Family Communication: None at bedside  Disposition Plan: Home   Consultants:  Pulmonary  Procedures:  None  Antimicrobials:  None  DVT prophylaxis: Eliquis  Status is: Inpatient    Dispo:  Patient From: Home  Planned Disposition: Home with [facility]  Expected discharge date: 04/27/20  Medically stable for discharge: No, ongoing management of COVID-19 viral pneumonia.         Objective: Vitals:   04/26/20 0300 04/26/20 0537 04/26/20 0911 04/26/20 1137  BP: (!) 123/59 (!) 103/49 (!) 98/59 (!) 122/50  Pulse: 65 60 60 63  Resp:  19 (!) 22 (!) 22  Temp:  98 F (36.7 C) 97.6 F (36.4 C) 98.3 F (36.8 C)  TempSrc:   Oral Oral  SpO2:  90% 93% 94%  Weight:      Height:        Intake/Output Summary (Last 24 hours) at 04/26/2020 1223 Last data filed at 04/26/2020 7867 Gross per 24 hour  Intake 436 ml  Output --  Net 436 ml   Filed Weights   04/23/20 2226  Weight: 68 kg    Exam:  . General: 79 y.o. year-old female pleasant well-developed well-nourished, weak appearing, no acute distress.  Alert oriented x3.   . Cardiovascular: Regular rate and rhythm no rubs or gallops.   Marland Kitchen Respiratory: Mild rales at bases no wheezing noted.   .  Abdomen: Soft nontender normal bowel sounds present.   . Musculoskeletal: Trace lower extremity edema bilaterally. Marland Kitchen Psychiatry: Mood is appropriate for condition and setting.   Data Reviewed: CBC: Recent Labs  Lab 04/23/20 2313 04/24/20 0316 04/25/20 0300 04/26/20 0350  WBC 7.5 9.1 7.6  5.0  NEUTROABS 6.9  --   --   --   HGB 12.2 12.3 11.7* 11.8*  HCT 36.9 35.6* 35.3* 35.4*  MCV 93.9 92.0 93.4 93.2  PLT 225 245 269 672   Basic Metabolic Panel: Recent Labs  Lab 04/23/20 2313 04/24/20 0316 04/25/20 0300 04/26/20 0350  NA 131* 130* 135 132*  K 4.2 4.0 3.5 4.5  CL 98 98 101 98  CO2 24 22 24 23   GLUCOSE 216* 229* 153* 230*  BUN 11 9 14 19   CREATININE 0.99 0.81 0.84 0.76  CALCIUM 8.5* 8.3* 7.8* 8.4*   GFR: Estimated Creatinine Clearance: 53 mL/min (by C-G formula based on SCr of 0.76 mg/dL). Liver Function Tests: Recent Labs  Lab 04/23/20 2313 04/24/20 0316 04/25/20 0300 04/26/20 0350  AST 28 32 25 24  ALT 25 26 21 21   ALKPHOS 49 52 46 49  BILITOT 0.6 0.5 0.6 0.4  PROT 7.1 7.4 6.2* 6.8  ALBUMIN 3.6 3.6 3.0* 3.3*   No results for input(s): LIPASE, AMYLASE in the last 168 hours. No results for input(s): AMMONIA in the last 168 hours. Coagulation Profile: Recent Labs  Lab 04/24/20 0316 04/25/20 0300 04/26/20 0350  INR 1.0 1.3* 1.4*   Cardiac Enzymes: No results for input(s): CKTOTAL, CKMB, CKMBINDEX, TROPONINI in the last 168 hours. BNP (last 3 results) No results for input(s): PROBNP in the last 8760  hours. HbA1C: Recent Labs    04/24/20 0316  HGBA1C 6.7*   CBG: Recent Labs  Lab 04/25/20 1348 04/25/20 1707 04/25/20 2108 04/26/20 0747 04/26/20 1134  GLUCAP 173* 205* 261* 222* 157*   Lipid Profile: Recent Labs    04/23/20 2313  TRIG 90   Thyroid Function Tests: No results for input(s): TSH, T4TOTAL, FREET4, T3FREE, THYROIDAB in the last 72 hours. Anemia Panel: Recent Labs    04/23/20 2313  FERRITIN 229   Urine analysis:    Component Value Date/Time   COLORURINE YELLOW 05/06/2016 1340   APPEARANCEUR CLOUDY (A) 05/06/2016 1340   LABSPEC 1.017 05/06/2016 1340   PHURINE 5.5 05/06/2016 1340   GLUCOSEU NEGATIVE 05/06/2016 1340   Ducor 05/06/2016 1340   [place] 05/06/2016 1340   KETONESUR NEGATIVE  05/06/2016 1340   PROTEINUR NEGATIVE 05/06/2016 1340   NITRITE NEGATIVE 05/06/2016 1340   LEUKOCYTESUR NEGATIVE 05/06/2016 1340   Sepsis Labs: @LABRCNTIP (procalcitonin:4,lacticidven:4)  ) Recent Results (from the past 240 hour(s))  SARS Coronavirus 2 by RT PCR (hospital order, performed in [place] hospital lab) Nasopharyngeal Nasopharyngeal Swab     Status: Abnormal   Collection Time: 04/23/20 11:00 PM   Specimen: Nasopharyngeal Swab  Result Value Ref Range Status   SARS Coronavirus 2 POSITIVE (A) NEGATIVE Final    Comment: RESULT CALLED TO, READ BACK BY AND VERIFIED WITH: RN J SMITH AT 0010 04/24/20 CRUICKSHANK A (NOTE) SARS-CoV-2 target nucleic acids are DETECTED  SARS-CoV-2 RNA is generally detectable in upper respiratory specimens  during the acute phase of infection.  Positive results are indicative  of the presence of the identified virus, but do not rule out bacterial infection or co-infection with other pathogens not detected by the test.  Clinical correlation with patient history and  other diagnostic information is necessary to determine patient infection status.  The expected result is negative.  Fact Sheet for Patients:   StrictlyIdeas.no   Fact Sheet for Healthcare Providers:   BankingDealers.co.za    This test is not yet approved or cleared by the [place] FDA and  has been authorized for detection and/or diagnosis of SARS-CoV-2 by FDA under an Emergency Use Authorization (EUA).  This EUA will remain in effect (meaning t his test can be used) for the duration of  the COVID-19 declaration under Section 564(b)(1) of the Act, 21 U.S.C. section 360-bbb-3(b)(1), unless the authorization is terminated or revoked sooner.  Performed at [facility], [place] [address] [place], [place] [address]   Blood Culture (routine x 2)     Status: None (Preliminary result)   Collection Time: 04/23/20 11:13  PM   Specimen: BLOOD  Result Value Ref Range Status   Specimen Description   Final    BLOOD BLOOD LEFT FOREARM Performed at [facility] [address] [place], [place] [address]    Special Requests   Final    BOTTLES DRAWN AEROBIC AND ANAEROBIC Blood Culture adequate volume Performed at [facility] [address], [place], [place] [address]    Culture   Final    NO GROWTH 2 DAYS Performed at [facility] [address] [place], [place] [address]    Report Status PENDING  Incomplete  Blood Culture (routine x 2)     Status: None (Preliminary result)   Collection Time: 04/23/20 11:13 PM   Specimen: BLOOD  Result Value Ref Range Status   Specimen Description   Final    BLOOD  RIGHT ANTECUBITAL Performed at [facility] [address] [place], [place] [address]    Special Requests   Final    BOTTLES DRAWN AEROBIC AND ANAEROBIC Blood Culture adequate volume Performed at [facility] [address], [place], [place] [address]    Culture   Final    NO GROWTH 2 DAYS Performed at [facility] [address] [place], [place] [address]    Report Status PENDING  Incomplete      Studies: No results found.  Scheduled Meds: . albuterol  2 puff Inhalation Q6H  . apixaban  2.5 mg Oral BID  . vitamin C  500 mg Oral Daily  . atenolol  12.5 mg Oral BID  . baricitinib  4 mg Oral Daily  . cholecalciferol  1,000 Units Oral Daily  . docusate sodium  200 mg Oral QHS  . gabapentin  600 mg Oral QHS  . guaiFENesin  600 mg Oral BID  . insulin aspart  0-15 Units Subcutaneous TID WC  . insulin aspart  2 Units Subcutaneous TID WC  . ipratropium  2 puff Inhalation Q6H  . isosorbide mononitrate  15 mg Oral QHS  . losartan  100 mg Oral QHS  . methylPREDNISolone (SOLU-MEDROL) injection  60 mg Intravenous Q12H  . pantoprazole  40 mg Oral Q1200  . PARoxetine  10 mg Oral BID  .  sulfamethoxazole-trimethoprim  1 tablet Oral Once per day on Mon Wed Fri  . zinc sulfate  220 mg Oral Daily  . zolpidem  5 mg Oral QHS    Continuous Infusions: . remdesivir 100 mg in NS 100 mL 100 mg (04/26/20 1217)     LOS: 2 days     Kayleen Memos, MD [facility] Pager [phone]  If 7PM-7AM, please contact night-coverage [email or web address] Password [facility] 04/26/2020, 12:23 PM

## 2020-04-27 LAB — COMPREHENSIVE METABOLIC PANEL
ALT: 32 U/L (ref 0–44)
AST: 33 U/L (ref 15–41)
Albumin: 3.7 g/dL (ref 3.5–5.0)
Alkaline Phosphatase: 52 U/L (ref 38–126)
Anion gap: 14 (ref 5–15)
BUN: 24 mg/dL — ABNORMAL HIGH (ref 8–23)
CO2: 24 mmol/L (ref 22–32)
Calcium: 9.3 mg/dL (ref 8.9–10.3)
Chloride: 91 mmol/L — ABNORMAL LOW (ref 98–111)
Creatinine, Ser: 0.85 mg/dL (ref 0.44–1.00)
GFR calc Af Amer: >60 mL/min (ref >60)
GFR calc non Af Amer: >60 mL/min (ref >60)
Glucose, Bld: 152 mg/dL — ABNORMAL HIGH (ref 70–99)
Potassium: 4.4 mmol/L (ref 3.5–5.1)
Sodium: 129 mmol/L — ABNORMAL LOW (ref 135–145)
Total Bilirubin: 0.7 mg/dL (ref 0.3–1.2)
Total Protein: 7.3 g/dL (ref 6.5–8.1)

## 2020-04-27 LAB — CBC WITH DIFFERENTIAL/PLATELET
Abs Immature Granulocytes: 0.16 10*3/uL — ABNORMAL HIGH (ref 0.00–0.07)
Basophils Absolute: 0 10*3/uL (ref 0.0–0.1)
Basophils Relative: 0 %
Eosinophils Absolute: 0 10*3/uL (ref 0.0–0.5)
Eosinophils Relative: 0 %
HCT: 37.9 % (ref 36.0–46.0)
Hemoglobin: 13.1 g/dL (ref 12.0–15.0)
Immature Granulocytes: 2 %
Lymphocytes Relative: 11 %
Lymphs Abs: 0.9 10*3/uL (ref 0.7–4.0)
MCH: 31.2 pg (ref 26.0–34.0)
MCHC: 34.6 g/dL (ref 30.0–36.0)
MCV: 90.2 fL (ref 80.0–100.0)
Monocytes Absolute: 0.7 10*3/uL (ref 0.1–1.0)
Monocytes Relative: 8 %
Neutro Abs: 7 10*3/uL (ref 1.7–7.7)
Neutrophils Relative %: 79 %
Platelets: 456 10*3/uL — ABNORMAL HIGH (ref 150–400)
RBC: 4.2 MIL/uL (ref 3.87–5.11)
RDW: 12.1 % (ref 11.5–15.5)
WBC: 8.9 10*3/uL (ref 4.0–10.5)
nRBC: 0 % (ref 0.0–0.2)

## 2020-04-27 LAB — GLUCOSE, CAPILLARY
Glucose-Capillary: 200 mg/dL — ABNORMAL HIGH (ref 70–99)
Glucose-Capillary: 189 mg/dL — ABNORMAL HIGH (ref 70–99)
Glucose-Capillary: 111 mg/dL — ABNORMAL HIGH (ref 70–99)
Glucose-Capillary: 173 mg/dL — ABNORMAL HIGH (ref 70–99)

## 2020-04-27 LAB — LACTATE DEHYDROGENASE: LDH: 258 U/L — ABNORMAL HIGH (ref 98–192)

## 2020-04-27 LAB — FERRITIN: Ferritin: 369 ng/mL — ABNORMAL HIGH (ref 11–307)

## 2020-04-27 LAB — C-REACTIVE PROTEIN: CRP: 1.8 mg/dL — ABNORMAL HIGH (ref <1.0)

## 2020-04-27 MED ORDER — FUROSEMIDE 10 MG/ML IJ SOLN
20.0000 mg | Freq: Two times a day (BID) | INTRAMUSCULAR | Status: AC
  Administered 2020-04-27 – 2020-04-29 (×4): 20 mg via INTRAVENOUS
  Filled 2020-04-27 (×4): qty 2

## 2020-04-27 MED ORDER — AZITHROMYCIN 250 MG PO TABS
250.0000 mg | ORAL_TABLET | Freq: Every day | ORAL | Status: DC
  Administered 2020-04-28 – 2020-04-29 (×2): 250 mg via ORAL
  Filled 2020-04-27 (×2): qty 1

## 2020-04-27 MED ORDER — AZITHROMYCIN 250 MG PO TABS
500.0000 mg | ORAL_TABLET | Freq: Every day | ORAL | Status: AC
  Administered 2020-04-27: 500 mg via ORAL
  Filled 2020-04-27: qty 2

## 2020-04-27 MED ORDER — FUROSEMIDE 10 MG/ML IJ SOLN: 20.0000 mg | Freq: Two times a day (BID) | INTRAMUSCULAR | Status: DC

## 2020-04-27 NOTE — Progress Notes (Signed)
-Physical Therapy Treatment Patient Details Name: Stacy Contreras MRN: [id number] DOB: 05-Aug-1941 Today's Date: 04/27/2020    History of Present Illness 79 y.o. female with HTN, DM type II,  DVT/PE on Eliquis, ILD/asthma, on 1-2 LNC at baseline,  breast cancer in remission, CAD s/p PCI to LAD who presented with dyspnea x 5-7days, generalize malaise, body aches, fever, chills, dry cough and covid + per home test and [facility] test.    PT Comments    The patient received on 7 L HFNC, SPO2 955. While ambulating in room   X 80' on 8 L HFNC, Spo2 remaining in low to mid 80's , also while in Bathroom  washing up. Patient was active for at least 30" and reports feeling good about being able to mobilize. Patient  Will benefit from multiple  walss in room with staff.  Patient has theraband  For UE's and is using IS and Flutter regularly. SPO2 back to ~ 905 on  7  After resting./  Follow Up Recommendations  Home health PT;Supervision - Intermittent     Equipment Recommendations  3in1 (PT)    Recommendations for Other Services  OT     Precautions / Restrictions Precautions Precaution Comments: monitor O2 Restrictions Weight Bearing Restrictions: No    Mobility  Bed Mobility Overal bed mobility: Modified Independent                Transfers Overall transfer level: Needs assistance Equipment used: Rolling walker (2 wheeled) Transfers: Sit to/from Stand Sit to Stand: Min guard         General transfer comment: uses UE to stand from bed and toilet  Ambulation/Gait Ambulation/Gait assistance: Min guard Gait Distance (Feet): 80 Feet Assistive device: Rolling walker (2 wheeled) Gait Pattern/deviations: Step-through pattern Gait velocity: decr   General Gait Details: then 20 x 2 no RW.   Stairs             Wheelchair Mobility    Modified Rankin (Stroke Patients Only)       Balance   Sitting-balance support: Feet supported;No upper extremity supported Sitting  balance-Leahy Scale: Good     Standing balance support: During functional activity;Single extremity supported Standing balance-Leahy Scale: Good Standing balance comment: stood in BR and washed up                            Cognition Arousal/Alertness: Awake/alert Behavior During Therapy: WFL for tasks assessed/performed                                          Exercises Other Exercises Other Exercises: beige Tb for UE abd/abd, shld flex and extn  x 20    General Comments        Pertinent Vitals/Pain Pain Assessment: No/denies pain    Home Living                      Prior Function            PT Goals (current goals can now be found in the care plan section) Progress towards PT goals: Progressing toward goals    Frequency    Min 3X/week      PT Plan Current plan remains appropriate    Co-evaluation              AM-PAC PT "  6 Clicks" Mobility   Outcome Measure  Help needed turning from your back to your side while in a flat bed without using bedrails?: None Help needed moving from lying on your back to sitting on the side of a flat bed without using bedrails?: None Help needed moving to and from a bed to a chair (including a wheelchair)?: A Little Help needed standing up from a chair using your arms (e.g., wheelchair or bedside chair)?: A Little Help needed to walk in hospital room?: A Little Help needed climbing 3-5 steps with a railing? : A Little 6 Click Score: 20    End of Session Equipment Utilized During Treatment: Oxygen Activity Tolerance: Patient tolerated treatment well Patient left: in chair;with call bell/phone within reach Nurse Communication: Mobility status;Other (comment) PT Visit Diagnosis: Unsteadiness on feet (R26.81);Other abnormalities of gait and mobility (R26.89);Muscle weakness (generalized) (M62.81)     Time: 1610-9604 PT Time Calculation (min) (ACUTE ONLY): 52 min  Charges:  $Gait  Training: 8-22 mins $Therapeutic Exercise: 8-22 mins $Self Care/Home Management: 8-22                     Blanchard Kelch PT Acute Rehabilitation Services Pager [phone] Office [phone]    Rada Hay 04/27/2020, 1:53 PM

## 2020-04-27 NOTE — Progress Notes (Addendum)
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  Patient reports generalized malaise, body aches followed by fever103.6chills andmostly drycough.  Bought a home kit to check for COVID and was positive.  Next day went [facility] to confirm and test was affirmative.  Reportsworseningdyspneawith persistent cough and wheezing. Increased O2 requirement to 2-3LNC. Called PCP who advised to come to the ED. No CP, palpitation, orthopnea or PND. She is fully vaccinatedwith Moderna.   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  No h/o smoking, etoh or street drugs.   ED Course: 140/111, pulse 87, RR 22-26, SPO2 83-92% requiring 2 L nasal cannula T-max 98.4  -CXR with lower lobe opacities consistent with Covid infection. -EKG with NSR at 70, normal axis, no significant ST-T wave changes. Intervals within normal limits.  -Labs with -CRP 16.8 -Lactic acid 1.4 -D-dimer 0.41 -Procalcitonin less than 0.10 -LDH 201 -Ferritin 229 -Fibrinogen 596 -Covid PCR positive -WBC 7.5, Hb 12.2, platelets 225 -Sodium 131, potassium 4.2, BUN 11, creatinine 0.99 -Albumin 3.6, AST 28, ALT 25, ALP 49.  Seen by pulmonary, advised to continue to hold off Cellcept.  Currently on covid 19 directed therapies, Remdesivir, Baricitinib, IV solumedrol, bronchodilators, pulmonary toilet, and O2 supplementation as needed.  04/27/20: Seen and examined.  Cough is better but still significantly hypoxic and requiring 7L to maintain sats> 92%.  She has sutures in her right wrists that are due to be removed.  They  were placed in the ED at [facility] after a fall.  Curb sided via secure chat with ortho hand surgery to assist.    Assessment/Plan: Active Problems:   SOB (shortness of breath)   Coronary artery disease   Diabetes mellitus without complication (HCC)   Hypertension   DVT (deep venous thrombosis) (HCC)   ILD (interstitial lung disease) (HCC)   Interstitial lung disorders (HCC)   Acute respiratory distress syndrome (ARDS) due to COVID-19 virus ([place])   Pneumonia due to COVID-19 virus   COVID-19 viral pneumonia Positive COVID-19 test on 2020-04-23. Started on COVID-19 directed therapies, remdesivir day #5/5 Added baricitinib with patient's own consent.  Switched Decadron to IV Solu-Medrol on 04/25/2020. Vitamin D3, zinc, zinc Bronchodilators, albuterol inhaler 2 puffs every 6 hours, Atrovent inhaler 2 puffs every 6 hours Added Zpack for its anti-inflammatory properties Antitussives Incentive spirometer, flutter valve, mobilize as tolerated Pronation or side laying Monitor inflammatory markers daily Maintain O2 saturation greater than 92%. Currently requiring 7 L, oxygen saturation 94%.  Persistent Acute on chronic hypoxic respiratory failure secondary to COVID-19 viral pneumonia On 1 to 2 L nasal cannula PRN at baseline. Currently requiring 7 L to maintain O2 saturation greater than 92% Added 2 doses of IV Lasix 20 mg twice daily x2, repeat X 4 DOSES. Obtain CXR in the AM on 04/28/20 Continue to closely monitor  Essential HTN, BP has been soft Cut down dose of atenolol and Imdur due to soft Bps. Received Iv lasix intermittently Hold home norvasc Continue to monitor vital signs closely  Interstitial lung disease/pleuropulmonary fibroelastosis Followed by pulmonary outpatient Pulmonary consulted and following.  Appreciate assistance. Pulmonary recommended to continue to hold CellCept due to risks  of further infections. She is on Bactrim prophylactically Monday Wednesday Friday,  continue.  Type 2 diabetes with hyperglycemia, likely exacerbated by steroids Hemoglobin A1c 6.7 on 2020-01-23 Hold off oral hypoglycemics Continue insulin sliding scale  Right wrist with stitches, POA Stitches due to be removed 04/26/20 Ortho hand surgery contacted via secure chat to assist  History of DVT/PE Continue Eliquis  Essential hypertension BP stable Continue home antihypertensives  GERD Table Continue PPI  Chronic anxiety/depression Stable continue Paxil  Polyneuropathy Stable continue gabapentin  Physical debility PT to assess Continue PT OT with assistance and fall precautions Out of bed to chair every shift.     Code Status: Full code  Family Communication: None at bedside  Disposition Plan: Home   Consultants:  Pulmonary  Procedures:  None  Antimicrobials:  None  DVT prophylaxis: Eliquis  Status is: Inpatient    Dispo:  Patient From: Home  Planned Disposition: Home with [facility]  Expected discharge date: 04/28/20  Medically stable for discharge: No, ongoing management of COVID-19 viral pneumonia.         Objective: Vitals:   04/26/20 2111 04/27/20 0419 04/27/20 0906 04/27/20 1317  BP: 134/60 (!) 105/54  134/64  Pulse: 68 61  (!) 58  Resp: (!) 22 (!) 21  18  Temp: 98.2 F (36.8 C) 98.2 F (36.8 C)  98.3 F (36.8 C)  TempSrc: Oral     SpO2: 90% 95% 94% 98%  Weight:      Height:        Intake/Output Summary (Last 24 hours) at 04/27/2020 1455 Last data filed at 04/27/2020 1254 Gross per 24 hour  Intake 1060 ml  Output 450 ml  Net 610 ml   Filed Weights   04/23/20 2226  Weight: 68 kg    Exam:  . General: 79 y.o. year-old female pleasant in no acute distress.  Alert and oriented x3.   . Cardiovascular: Regular rate and rhythm no rubs or gallops.   Marland Kitchen Respiratory: Mild rales at bases no wheezing noted. .  Abdomen: Soft Nontender Normal Bowel Sounds Present.   . Musculoskeletal: Trace lower  extremity edema bilaterally. Marland Kitchen Psychiatry: Mood is appropriate for condition and setting.  Data Reviewed: CBC: Recent Labs  Lab 04/23/20 2313 04/24/20 0316 04/25/20 0300 04/26/20 0350  WBC 7.5 9.1 7.6 5.0  NEUTROABS 6.9  --   --   --   HGB 12.2 12.3 11.7* 11.8*  HCT 36.9 35.6* 35.3* 35.4*  MCV 93.9 92.0 93.4 93.2  PLT 225 245 269 825   Basic Metabolic Panel: Recent Labs  Lab 04/23/20 2313 04/24/20 0316 04/25/20 0300 04/26/20 0350  NA 131* 130* 135 132*  K 4.2 4.0 3.5 4.5  CL 98 98 101 98  CO2 24 22 24 23   GLUCOSE 216* 229* 153* 230*  BUN 11 9 14 19   CREATININE 0.99 0.81 0.84 0.76  CALCIUM 8.5* 8.3* 7.8* 8.4*   GFR: Estimated Creatinine Clearance: 53 mL/min (by C-G formula based on SCr of 0.76 mg/dL). Liver Function Tests: Recent Labs  Lab 04/23/20 2313 04/24/20 0316 04/25/20 0300 04/26/20 0350  AST 28 32 25 24  ALT 25 26 21 21   ALKPHOS 49 52 46 49  BILITOT 0.6 0.5 0.6 0.4  PROT 7.1 7.4 6.2* 6.8  ALBUMIN 3.6 3.6 3.0* 3.3*   No results for input(s): LIPASE, AMYLASE in the last 168 hours. No results for input(s): AMMONIA in the last 168 hours. Coagulation Profile: Recent Labs  Lab 04/24/20 0316  04/25/20 0300 04/26/20 0350  INR 1.0 1.3* 1.4*   Cardiac Enzymes: No results for input(s): CKTOTAL, CKMB, CKMBINDEX, TROPONINI in the last 168 hours. BNP (last 3 results) No results for input(s): PROBNP in the last 8760 hours. HbA1C: No results for input(s): HGBA1C in the last 72 hours. CBG: Recent Labs  Lab 04/26/20 1134 04/26/20 1558 04/26/20 2035 04/27/20 0740 04/27/20 1121  GLUCAP 157* 149* 184* 200* 189*   Lipid Profile: No results for input(s): CHOL, HDL, LDLCALC, TRIG, CHOLHDL, LDLDIRECT in the last 72 hours. Thyroid Function Tests: No results for input(s): TSH, T4TOTAL, FREET4, T3FREE, THYROIDAB in the last 72 hours. Anemia Panel: No results for input(s): VITAMINB12, FOLATE, FERRITIN, TIBC, IRON, RETICCTPCT in the last 72 hours. Urine  analysis:    Component Value Date/Time   COLORURINE YELLOW 05/06/2016 1340   APPEARANCEUR CLOUDY (A) 05/06/2016 1340   LABSPEC 1.017 05/06/2016 1340   PHURINE 5.5 05/06/2016 1340   GLUCOSEU NEGATIVE 05/06/2016 1340   HGBUR NEGATIVE 05/06/2016 1340   [place] 05/06/2016 1340   KETONESUR NEGATIVE 05/06/2016 1340   PROTEINUR NEGATIVE 05/06/2016 1340   NITRITE NEGATIVE 05/06/2016 1340   LEUKOCYTESUR NEGATIVE 05/06/2016 1340   Sepsis Labs: @LABRCNTIP (procalcitonin:4,lacticidven:4)  ) Recent Results (from the past 240 hour(s))  SARS Coronavirus 2 by RT PCR (hospital order, performed in [place] hospital lab) Nasopharyngeal Nasopharyngeal Swab     Status: Abnormal   Collection Time: 04/23/20 11:00 PM   Specimen: Nasopharyngeal Swab  Result Value Ref Range Status   SARS Coronavirus 2 POSITIVE (A) NEGATIVE Final    Comment: RESULT CALLED TO, READ BACK BY AND VERIFIED WITH: RN J SMITH AT 0010 04/24/20 CRUICKSHANK A (NOTE) SARS-CoV-2 target nucleic acids are DETECTED  SARS-CoV-2 RNA is generally detectable in upper respiratory specimens  during the acute phase of infection.  Positive results are indicative  of the presence of the identified virus, but do not rule out bacterial infection or co-infection with other pathogens not detected by the test.  Clinical correlation with patient history and  other diagnostic information is necessary to determine patient infection status.  The expected result is negative.  Fact Sheet for Patients:   StrictlyIdeas.no   Fact Sheet for Healthcare Providers:   BankingDealers.co.za    This test is not yet approved or cleared by the [place] FDA and  has been authorized for detection and/or diagnosis of SARS-CoV-2 by FDA under an Emergency Use Authorization (EUA).  This EUA will remain in effect (meaning t his test can be used) for the duration of  the COVID-19 declaration under  Section 564(b)(1) of the Act, 21 U.S.C. section 360-bbb-3(b)(1), unless the authorization is terminated or revoked sooner.  Performed at [facility], [place] [address], [place], [place] [address]   Blood Culture (routine x 2)     Status: None (Preliminary result)   Collection Time: 04/23/20 11:13 PM   Specimen: BLOOD  Result Value Ref Range Status   Specimen Description   Final    BLOOD BLOOD LEFT FOREARM Performed at [place] [address] [place], [place] [address]    Special Requests   Final    BOTTLES DRAWN AEROBIC AND ANAEROBIC Blood Culture adequate volume Performed at [place] [address], [place], [place] [address]    Culture   Final    NO GROWTH 3 DAYS Performed at [facility], [place] [address] [place], [place] [address]    Report Status PENDING  Incomplete  Blood Culture (routine x 2)     Status: None (Preliminary result)   Collection Time: 04/23/20 11:13 PM   Specimen: BLOOD  Result Value Ref Range Status   Specimen Description   Final    BLOOD RIGHT ANTECUBITAL Performed at [place] [address] [place], [place] [address]    Special Requests   Final    BOTTLES DRAWN AEROBIC AND ANAEROBIC Blood Culture adequate volume Performed at [facility] [address], [place], [place] [address]    Culture   Final    NO GROWTH 3 DAYS Performed at [facility], [place] [address] [place], [place] [address]    Report Status PENDING  Incomplete      Studies: No results found.  Scheduled Meds: . albuterol  2 puff Inhalation Q6H  . apixaban  2.5 mg Oral BID  . vitamin C  500 mg Oral Daily  . atenolol  12.5 mg Oral BID  . [START ON 04/28/2020] azithromycin  250 mg Oral Daily  . baricitinib  4 mg Oral Daily  . cholecalciferol  1,000 Units Oral Daily  . docusate sodium  200 mg Oral QHS  . gabapentin  600 mg Oral QHS  . guaiFENesin  600 mg Oral  BID  . insulin aspart  0-15 Units Subcutaneous TID WC  . insulin aspart  2 Units Subcutaneous TID WC  . ipratropium  2 puff Inhalation Q6H  . isosorbide mononitrate  15 mg Oral QHS  . losartan  100 mg Oral QHS  . methylPREDNISolone (SOLU-MEDROL) injection  60 mg Intravenous Q12H  . pantoprazole  40 mg Oral Q1200  . PARoxetine  10 mg Oral BID  . sulfamethoxazole-trimethoprim  1 tablet Oral Once per day on Mon Wed Fri  . zinc sulfate  220 mg Oral Daily  . zolpidem  5 mg Oral QHS    Continuous Infusions:    LOS: 3 days     Kayleen Memos, MD [facility] Pager [phone]  If 7PM-7AM, please contact night-coverage [email or web address] Password [email or web address] 04/27/2020, 2:55 PM

## 2020-04-28 ENCOUNTER — Inpatient Hospital Stay (HOSPITAL_COMMUNITY): Payer: Medicare Other

## 2020-04-28 LAB — BASIC METABOLIC PANEL
Anion gap: 11 (ref 5–15)
BUN: 29 mg/dL — ABNORMAL HIGH (ref 8–23)
CO2: 23 mmol/L (ref 22–32)
Calcium: 8.7 mg/dL — ABNORMAL LOW (ref 8.9–10.3)
Chloride: 94 mmol/L — ABNORMAL LOW (ref 98–111)
Creatinine, Ser: 1 mg/dL (ref 0.44–1.00)
GFR calc Af Amer: >60 mL/min (ref >60)
GFR calc non Af Amer: 54 mL/min — ABNORMAL LOW (ref >60)
Glucose, Bld: 230 mg/dL — ABNORMAL HIGH (ref 70–99)
Potassium: 4.6 mmol/L (ref 3.5–5.1)
Sodium: 128 mmol/L — ABNORMAL LOW (ref 135–145)

## 2020-04-28 LAB — GLUCOSE, CAPILLARY
Glucose-Capillary: 229 mg/dL — ABNORMAL HIGH (ref 70–99)
Glucose-Capillary: 121 mg/dL — ABNORMAL HIGH (ref 70–99)
Glucose-Capillary: 204 mg/dL — ABNORMAL HIGH (ref 70–99)
Glucose-Capillary: 120 mg/dL — ABNORMAL HIGH (ref 70–99)

## 2020-04-28 LAB — BRAIN NATRIURETIC PEPTIDE: B Natriuretic Peptide: 87.2 pg/mL (ref 0.0–100.0)

## 2020-04-28 LAB — CBC
HCT: 36.3 % (ref 36.0–46.0)
Hemoglobin: 12.5 g/dL (ref 12.0–15.0)
MCH: 31.3 pg (ref 26.0–34.0)
MCHC: 34.4 g/dL (ref 30.0–36.0)
MCV: 91 fL (ref 80.0–100.0)
Platelets: 395 10*3/uL (ref 150–400)
RBC: 3.99 MIL/uL (ref 3.87–5.11)
RDW: 12.2 % (ref 11.5–15.5)
WBC: 7 10*3/uL (ref 4.0–10.5)
nRBC: 0 % (ref 0.0–0.2)

## 2020-04-28 LAB — BLOOD GAS, ARTERIAL
Acid-Base Excess: 3.2 mmol/L — ABNORMAL HIGH (ref 0.0–2.0)
Bicarbonate: 25.5 mmol/L (ref 20.0–28.0)
O2 Saturation: 93.7 %
Patient temperature: 98.6
pCO2 arterial: 32.4 mmHg (ref 32.0–48.0)
pH, Arterial: 7.507 — ABNORMAL HIGH (ref 7.350–7.450)
pO2, Arterial: 78.8 mmHg — ABNORMAL LOW (ref 83.0–108.0)

## 2020-04-28 LAB — LACTATE DEHYDROGENASE: LDH: 229 U/L — ABNORMAL HIGH (ref 98–192)

## 2020-04-28 LAB — C-REACTIVE PROTEIN: CRP: 1 mg/dL — ABNORMAL HIGH (ref <1.0)

## 2020-04-28 MED ORDER — METHYLPREDNISOLONE SODIUM SUCC 40 MG IJ SOLR
40.0000 mg | Freq: Two times a day (BID) | INTRAMUSCULAR | Status: DC
  Administered 2020-04-28 – 2020-04-29 (×2): 40 mg via INTRAVENOUS
  Filled 2020-04-28 (×2): qty 1

## 2020-04-28 NOTE — Progress Notes (Signed)
Patient is on 4L via [place] at 93%, she desats with ambulation and movement but her saturation stay low to mid-90's while resting. While ambulating patient does need break to deep breathe and at times dizzy.

## 2020-04-28 NOTE — Progress Notes (Addendum)
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  Patient reports generalized malaise, body aches followed by fever103.6chills andmostly drycough.  Bought a home kit to check for COVID and was positive.  Next day [facility] confirm and test was affirmative.  Reportsworseningdyspneawith persistent cough and wheezing. Increased O2 requirement to 2-3LNC. Called PCP who advised to come to the ED. No CP, palpitation, orthopnea or PND. She is fully vaccinatedwith Moderna.   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  No h/o smoking, etoh or street drugs.   ED Course: 140/111, pulse 87, RR 22-26, SPO2 83-92% requiring 2 L nasal cannula T-max 98.4  -CXR with lower lobe opacities consistent with Covid infection. -EKG with NSR at 70, normal axis, no significant ST-T wave changes. Intervals within normal limits.  -Labs with -CRP 16.8 -Lactic acid 1.4 -D-dimer 0.41 -Procalcitonin less than 0.10 -LDH 201 -Ferritin 229 -Fibrinogen 596 -Covid PCR positive -WBC 7.5, Hb 12.2, platelets 225 -Sodium 131, potassium 4.2, BUN 11, creatinine 0.99 -Albumin 3.6, AST 28, ALT 25, ALP 49.  Seen by pulmonary, advised to continue to hold off Cellcept.  Currently on covid 19 directed therapies, Remdesivir, Baricitinib, IV solumedrol, bronchodilators, pulmonary toilet, and O2 supplementation as needed.  R wrist sutures placed at [facility] ED removed on 04/28/20 by Hand Surgery Dr. Mardelle Contreras.  We appreciate surgery's assistance.  04/28/20: Seen and examined.  Feels better today.   Personally reviewed CXR done today.  Some atelectasis noted at bases.    Assessment/Plan: Active Problems:   SOB (shortness of breath)   Coronary artery disease   Diabetes mellitus without complication (HCC)   Hypertension   DVT (deep venous thrombosis) (HCC)   ILD (interstitial lung disease) (HCC)   Interstitial lung disorders (HCC)   Acute respiratory distress syndrome (ARDS) due to COVID-19 virus ([place])   Pneumonia due to COVID-19 virus   COVID-19 viral pneumonia Positive COVID-19 test on 2020-04-23. Started on COVID-19 directed therapies, remdesivir day #5/5 Added baricitinib with patient's own consent.  Switched Decadron to IV Solu-Medrol on 04/25/2020. Vitamin D3, zinc, zinc Bronchodilators, albuterol inhaler 2 puffs every 6 hours, Atrovent inhaler 2 puffs every 6 hours Added Zpack for its anti-inflammatory properties Antitussives Incentive spirometer, flutter valve, mobilize as tolerated Pronation or side laying Monitor inflammatory markers daily, they are downtrending Weaning IV solumedrol to 40 mg BID, will taper prednisone starting tomorrow. Maintain O2 saturation greater than 92%. Currently on 3 L with O2 saturation of 98%.  Persistent Acute on chronic hypoxic respiratory failure secondary to COVID-19 viral pneumonia On 1 to 2 L nasal cannula PRN at baseline. Repeat chest x-ray done this morning, personally reviewed, which is showing atelectasis in the bases. Oxygen requirement is improving Continue management as stated above.  Essential HTN BP, soft initially, now at goal. Cut down dose of atenolol and Imdur due to soft Bps. Received Iv lasix intermittently Hold home norvasc Continue to monitor vital signs closely  Interstitial lung disease/pleuropulmonary fibroelastosis Followed by pulmonary outpatient Pulmonary consulted and following.  Appreciate assistance. Pulmonary recommended to continue to hold CellCept due to risks of further infections. She is on  Bactrim prophylactically  Monday Wednesday Friday, continue.  Type 2 diabetes with hyperglycemia, likely exacerbated by steroids Hemoglobin A1c 6.7 on 2020-01-23 Hold off oral hypoglycemics Continue insulin sliding scale  Right wrist laceration with stitches post fall, POA Stitches removed by hand surgery Dr. Mardelle Contreras We highly appreciate assistance. Will follow up with Dr. Mardelle Contreras outpatient.  History of DVT/PE Continue Eliquis  Essential hypertension BP stable Continue home antihypertensives  GERD Table Continue PPI  Chronic anxiety/depression Stable continue Paxil  Polyneuropathy Stable continue gabapentin  Physical debility PT to assess Continue PT OT with assistance and fall precautions Out of bed to chair every shift.     Code Status: Full code  Family Communication: None at bedside  Disposition Plan: Home   Consultants:  Pulmonary  Procedures:  None  Antimicrobials:  None  DVT prophylaxis: Eliquis  Status is: Inpatient    Dispo:  Patient From: Home  Planned Disposition: Home with Health Care Svc  Expected discharge date: 04/29/20  Medically stable for discharge: No, ongoing management of COVID-19 viral pneumonia.         Objective: Vitals:   04/27/20 1317 04/27/20 1927 04/28/20 0440 04/28/20 1317  BP: 134/64 (!) 145/64 (!) 122/58 122/62  Pulse: (!) 58 62 (!) 58 (!) 56  Resp: 18 20 (!) 21 16  Temp: 98.3 F (36.8 C) 98.7 F (37.1 C) 98.4 F (36.9 C) 98.4 F (36.9 C)  TempSrc:    Oral  SpO2: 98% 94% 96% 98%  Weight:      Height:        Intake/Output Summary (Last 24 hours) at 04/28/2020 1340 Last data filed at 04/28/2020 0900 Gross per 24 hour  Intake 360 ml  Output 1300 ml  Net -940 ml   Filed Weights   04/23/20 2226  Weight: 68 kg    Exam:  . General: 79 y.o. year-old female pleasant in no acute distress.  Alert and oriented x3.   . Cardiovascular: Regular rate and rhythm no rubs or gallops.   Marland Kitchen Respiratory:  Mild rales at bases no wheezing noted.  .  Abdomen: Soft nontender normal bowel sounds present. . Musculoskeletal: Trace lower extremity edema bilaterally.   Marland Kitchen Psychiatry: Mood is appropriate for condition and setting.  Data Reviewed: CBC: Recent Labs  Lab 04/23/20 2313 04/23/20 2313 04/24/20 0316 04/25/20 0300 04/26/20 0350 04/27/20 1726 04/28/20 0525  WBC 7.5   < > 9.1 7.6 5.0 8.9 7.0  NEUTROABS 6.9  --   --   --   --  7.0  --   HGB 12.2   < > 12.3 11.7* 11.8* 13.1 12.5  HCT 36.9   < > 35.6* 35.3* 35.4* 37.9 36.3  MCV 93.9   < > 92.0 93.4 93.2 90.2 91.0  PLT 225   < > 245 269 313 456* 395   < > = values in this interval not displayed.   Basic Metabolic Panel: Recent Labs  Lab 04/24/20 0316 04/25/20 0300 04/26/20 0350 04/27/20 1726 04/28/20 0525  NA 130* 135 132* 129* 128*  K 4.0 3.5 4.5 4.4 4.6  CL 98 101 98 91* 94*  CO2 22 24 23 24 23   GLUCOSE 229* 153* 230* 152* 230*  BUN 9 14 19  24* 29*  CREATININE 0.81 0.84 0.76 0.85 1.00  CALCIUM 8.3* 7.8* 8.4* 9.3 8.7*   GFR: Estimated Creatinine Clearance: 42.4 mL/min (by C-G formula based on SCr of 1 mg/dL). Liver Function Tests: Recent Labs  Lab 04/23/20 2313 04/24/20 0316 04/25/20 0300 04/26/20 0350  04/27/20 1726  AST 28 32 25 24 33  ALT 25 26 21 21  32  ALKPHOS 49 52 46 49 52  BILITOT 0.6 0.5 0.6 0.4 0.7  PROT 7.1 7.4 6.2* 6.8 7.3  ALBUMIN 3.6 3.6 3.0* 3.3* 3.7   No results for input(s): LIPASE, AMYLASE in the last 168 hours. No results for input(s): AMMONIA in the last 168 hours. Coagulation Profile: Recent Labs  Lab 04/24/20 0316 04/25/20 0300 04/26/20 0350  INR 1.0 1.3* 1.4*   Cardiac Enzymes: No results for input(s): CKTOTAL, CKMB, CKMBINDEX, TROPONINI in the last 168 hours. BNP (last 3 results) No results for input(s): PROBNP in the last 8760 hours. HbA1C: No results for input(s): HGBA1C in the last 72 hours. CBG: Recent Labs  Lab 04/27/20 1121 04/27/20 1618 04/27/20 2025 04/28/20 0735  04/28/20 1158  GLUCAP 189* 111* 173* 229* 121*   Lipid Profile: No results for input(s): CHOL, HDL, LDLCALC, TRIG, CHOLHDL, LDLDIRECT in the last 72 hours. Thyroid Function Tests: No results for input(s): TSH, T4TOTAL, FREET4, T3FREE, THYROIDAB in the last 72 hours. Anemia Panel: Recent Labs    04/27/20 1726  FERRITIN 369*   Urine analysis:    Component Value Date/Time   COLORURINE YELLOW 05/06/2016 1340   APPEARANCEUR CLOUDY (A) 05/06/2016 1340   LABSPEC 1.017 05/06/2016 1340   PHURINE 5.5 05/06/2016 1340   GLUCOSEU NEGATIVE 05/06/2016 1340   [place] 05/06/2016 1340   [place] 05/06/2016 1340   KETONESUR NEGATIVE 05/06/2016 1340   PROTEINUR NEGATIVE 05/06/2016 1340   NITRITE NEGATIVE 05/06/2016 1340   LEUKOCYTESUR NEGATIVE 05/06/2016 1340   Sepsis Labs: @LABRCNTIP (procalcitonin:4,lacticidven:4)  ) Recent Results (from the past 240 hour(s))  SARS Coronavirus 2 by RT PCR (hospital order, performed in [place] hospital lab) Nasopharyngeal Nasopharyngeal Swab     Status: Abnormal   Collection Time: 04/23/20 11:00 PM   Specimen: Nasopharyngeal Swab  Result Value Ref Range Status   SARS Coronavirus 2 POSITIVE (A) NEGATIVE Final    Comment: RESULT CALLED TO, READ BACK BY AND VERIFIED WITH: RN J SMITH AT 0010 04/24/20 CRUICKSHANK A (NOTE) SARS-CoV-2 target nucleic acids are DETECTED  SARS-CoV-2 RNA is generally detectable in upper respiratory specimens  during the acute phase of infection.  Positive results are indicative  of the presence of the identified virus, but do not rule out bacterial infection or co-infection with other pathogens not detected by the test.  Clinical correlation with patient history and  other diagnostic information is necessary to determine patient infection status.  The expected result is negative.  Fact Sheet for Patients:   StrictlyIdeas.no   Fact Sheet for Healthcare Providers:     BankingDealers.co.za    This test is not yet approved or cleared by the [place] FDA and  has been authorized for detection and/or diagnosis of SARS-CoV-2 by FDA under an Emergency Use Authorization (EUA).  This EUA will remain in effect (meaning t his test can be used) for the duration of  the COVID-19 declaration under Section 564(b)(1) of the Act, 21 U.S.C. section 360-bbb-3(b)(1), unless the authorization is terminated or revoked sooner.  Performed at [facility], [place] [address] [place], [place] [address]   Blood Culture (routine x 2)     Status: None (Preliminary result)   Collection Time: 04/23/20 11:13 PM   Specimen: BLOOD  Result Value Ref Range Status   Specimen Description   Final    BLOOD BLOOD LEFT FOREARM Performed at [facility], [address] [place], [place] [address]    Special Requests   Final    BOTTLES DRAWN AEROBIC AND ANAEROBIC Blood Culture adequate volume Performed at [place] [address] [place], [place] [address]    Culture   Final    NO GROWTH 3 DAYS Performed at [facility], [place] [address], [place], [place] [address]    Report Status PENDING  Incomplete  Blood Culture (routine x 2)     Status: None (Preliminary result)   Collection Time: 04/23/20 11:13 PM   Specimen: BLOOD  Result Value Ref Range Status   Specimen Description   Final    BLOOD RIGHT ANTECUBITAL Performed at [place] [address], [place], [place] [address]    Special Requests   Final    BOTTLES DRAWN AEROBIC AND ANAEROBIC Blood Culture adequate volume Performed at [place] [address], [place], [place] [address]    Culture   Final    NO GROWTH 3 DAYS Performed at [facility], [place] [address] [place], [place] [address]    Report Status PENDING  Incomplete      Studies: DG CHEST PORT 1 VIEW  Result Date:  04/28/2020 CLINICAL DATA:  Hypertension, diabetes, DVT/PE. Body aches fever chills. EXAM: PORTABLE CHEST 1 VIEW COMPARISON:  04/23/20 FINDINGS: Normal heart size. Calcified mediastinal lymph nodes are again noted. Hazy lung opacity overlying the right scratch set hazy lung opacity within the right midlung and right base is increased from previous exam. Atelectasis is identified within the left lung base. No signs of pleural effusion. IMPRESSION: Increased hazy lung opacity within the right midlung and right base. Electronically Signed   By: Kerby Moors M.D.   On: 04/28/2020 08:38    Scheduled Meds: . albuterol  2 puff Inhalation Q6H  . apixaban  2.5 mg Oral BID  . vitamin C  500 mg Oral Daily  . atenolol  12.5 mg Oral BID  . azithromycin  250 mg Oral Daily  . baricitinib  4 mg Oral Daily  . cholecalciferol  1,000 Units Oral Daily  . docusate sodium  200 mg Oral QHS  . furosemide  20 mg Intravenous BID  . gabapentin  600 mg Oral QHS  . guaiFENesin  600 mg Oral BID  . insulin aspart  0-15 Units Subcutaneous TID WC  . insulin aspart  2 Units Subcutaneous TID WC  . ipratropium  2 puff Inhalation Q6H  . isosorbide mononitrate  15 mg Oral QHS  . losartan  100 mg Oral QHS  . methylPREDNISolone (SOLU-MEDROL) injection  60 mg Intravenous Q12H  . pantoprazole  40 mg Oral Q1200  . PARoxetine  10 mg Oral BID  . sulfamethoxazole-trimethoprim  1 tablet Oral Once per day on Mon Wed Fri  . zinc sulfate  220 mg Oral Daily  . zolpidem  5 mg Oral QHS    Continuous Infusions:    LOS: 4 days     Kayleen Memos, MD [facility] Pager [phone]  If 7PM-7AM, please contact night-coverage [email or web address] Password [email or web address] 04/28/2020, 1:40 PM

## 2020-04-28 NOTE — Consult Note (Signed)
ORTHOPAEDIC CONSULTATION  REQUESTING PHYSICIAN: Darlin Drop, DO  Chief Complaint: Right wrist laceration  HPI: Stacy Contreras is a 79 y.o. female who had a syncopal episode about 2 weeks ago, and fell.  She had a laceration of the skin on her right volar distal forearm.  She underwent irrigation and repair in the emergency room in [place].  She has not had any problems with her right hand function since that time.  She would like to have her stitches removed.  Injury was 13 days ago.  She also got some swelling and pain over her left knee.  She has had a left knee replacement done before.  She is on Eliquis.  She had substantial ecchymosis.  She says her knee pain is fairly mild at this point and is functioning well.  Past Medical History:  Diagnosis Date  . Arthritis   . Asthma   . Coronary artery disease   . Diabetes mellitus without complication (HCC)   . DVT (deep venous thrombosis) (HCC)   . GERD (gastroesophageal reflux disease)   . History of breast cancer   . Hypertension   . Interstitial lung disease (HCC)   . Kidney stones   . Shortness of breath dyspnea    Past Surgical History:  Procedure Laterality Date  . APPENDECTOMY    . CARDIAC CATHETERIZATION    . CHOLECYSTECTOMY    . COLONOSCOPY    . CORONARY ANGIOPLASTY    . ESOPHAGOGASTRODUODENOSCOPY    . EYE SURGERY     cataracts  . hysterectomy    . left knee replacement    . LUNG BIOPSY Right 05/08/2016   Procedure: LUNG BIOPSY;  Surgeon: Loreli Slot, MD;  Location: [facility] OR;  Service: Thoracic;  Laterality: Right;  . right breast lumpectomy    . stents x 2    . VIDEO ASSISTED THORACOSCOPY Right 05/08/2016   Procedure: VIDEO ASSISTED THORACOSCOPY;  Surgeon: Loreli Slot, MD;  Location: [facility] OR;  Service: Thoracic;  Laterality: Right;  Marland Kitchen VIDEO BRONCHOSCOPY Bilateral 12/19/2015   Procedure: VIDEO BRONCHOSCOPY WITHOUT FLUORO;  Surgeon: Kalman Shan, MD;  Location: [facility] ENDOSCOPY;  Service: Endoscopy;   Laterality: Bilateral;  . VIDEO BRONCHOSCOPY N/A 05/08/2016   Procedure: VIDEO BRONCHOSCOPY;  Surgeon: Loreli Slot, MD;  Location: [facility] OR;  Service: Thoracic;  Laterality: N/A;   Social History   Socioeconomic History  . Marital status: Married    Spouse name: Not on file  . Number of children: Not on file  . Years of education: Not on file  . Highest education level: Not on file  Occupational History  . Occupation: Realtor  Tobacco Use  . Smoking status: Never Smoker  . Smokeless tobacco: Never Used  Substance and Sexual Activity  . Alcohol use: Yes    Alcohol/week: 0.0 standard drinks    Comment: occasional  . Drug use: No  . Sexual activity: Not on file  Other Topics Concern  . Not on file  Social History Narrative  . Not on file   Social Determinants of Health   Financial Resource Strain:   . Difficulty of Paying Living Expenses: Not on file  Food Insecurity:   . Worried About Programme researcher, broadcasting/film/video in the Last Year: Not on file  . Ran Out of Food in the Last Year: Not on file  Transportation Needs:   . Lack of Transportation (Medical): Not on file  . Lack of Transportation (Non-Medical): Not on file  Physical Activity:   .  Days of Exercise per Week: Not on file  . Minutes of Exercise per Session: Not on file  Stress:   . Feeling of Stress : Not on file  Social Connections:   . Frequency of Communication with Friends and Family: Not on file  . Frequency of Social Gatherings with Friends and Family: Not on file  . Attends Religious Services: Not on file  . Active Member of Clubs or Organizations: Not on file  . Attends Banker Meetings: Not on file  . Marital Status: Not on file   Family History  Problem Relation Age of Onset  . Lung disease Mother        ? brown lung  . Colon cancer Father   . Breast cancer Sister   . Breast cancer Daughter    Allergies  Allergen Reactions  . Codeine Other (See Comments)    Fast heart beat (tolerates  hydrocodone)  . Statins Other (See Comments)    Muscle weakness  . Sulfa Antibiotics Rash    Takes Bactrim     Positive ROS: All other systems have been reviewed and were otherwise negative with the exception of those mentioned in the HPI and as above.  Physical Exam: General: Alert, no acute distress Cardiovascular: No pedal edema Respiratory: No cyanosis, no use of accessory musculature GI: No organomegaly, abdomen is soft and non-tender Skin: Her traumatic wound over the volar forearm is well opposed and looks like the skin is healed.  She has well-healed surgical wound over her left knee. Neurologic: Sensation intact distally Psychiatric: Patient is competent for consent with normal mood and affect Lymphatic: No axillary or cervical lymphadenopathy  MUSCULOSKELETAL: Left knee has substantial ecchymosis, diffusely, with a hematoma around the knee and proximal tibia.  Range of motion actively is 0degrees to 95 degrees.  Her right hand has sensation and motor intact throughout all fingers.  Good capillary refill.  I do not appreciate any weakness with wrist flexion or extension.  Assessment: Active Problems:   SOB (shortness of breath)   Coronary artery disease   Diabetes mellitus without complication (HCC)   Hypertension   DVT (deep venous thrombosis) (HCC)   ILD (interstitial lung disease) (HCC)   Interstitial lung disorders (HCC)   Acute respiratory distress syndrome (ARDS) due to COVID-19 virus (HCC)   Pneumonia due to COVID-19 virus   Left knee contusion and right distal volar forearm laceration status post repair  Plan: Suture removal today, okay to use the hand as tolerated, I would keep the wound clean for at least another week or so.  As far as her knee goes, reassurance offered, she has a reasonable sized hematoma which should resolve with a course of time, warm compresses, etc.  I do not think x-rays are necessary.  She has good motion with no real pain.  I  removed all of her Prolene sutures from her wrist today, she tolerated this well, Mepilex dressing was applied.  She can follow-up with me as an outpatient on an as-needed basis.  All COVID donning and doffing precautions taken during the interaction.    Eulas Post, MD Cell [phone]   04/28/2020 10:11 AM

## 2020-04-28 NOTE — Discharge Instructions (Signed)
10 Things You Can Do to Manage Your COVID-19 Symptoms at Home If you have possible or confirmed COVID-19: 1. Stay home from work and school. And stay away from other public places. If you must go out, avoid using any kind of public transportation, ridesharing, or taxis. 2. Monitor your symptoms carefully. If your symptoms get worse, call your healthcare provider immediately. 3. Get rest and stay hydrated. 4. If you have a medical appointment, call the healthcare provider ahead of time and tell them that you have or may have COVID-19. 5. For medical emergencies, call 911 and notify the dispatch personnel that you have or may have COVID-19. 6. Cover your cough and sneezes with a tissue or use the inside of your elbow. 7. Wash your hands often with soap and water for at least 20 seconds or clean your hands with an alcohol-based hand sanitizer that contains at least 60% alcohol. 8. As much as possible, stay in a specific room and away from other people in your home. Also, you should use a separate bathroom, if available. If you need to be around other people in or outside of the home, wear a mask. 9. Avoid sharing personal items with other people in your household, like dishes, towels, and bedding. 10. Clean all surfaces that are touched often, like counters, tabletops, and doorknobs. Use household cleaning sprays or wipes according to the label instructions. cdc.gov/coronavirus 02/08/2019 This information is not intended to replace advice given to you by your health care provider. Make sure you discuss any questions you have with your health care provider. Document Revised: 07/13/2019 Document Reviewed: 07/13/2019 Elsevier Patient Education  2020 Elsevier Inc.  COVID-19: How to Protect Yourself and Others Know how it spreads  There is currently no vaccine to prevent coronavirus disease 2019 (COVID-19).  The best way to prevent illness is to avoid being exposed to this virus.  The virus is  thought to spread mainly from person-to-person. ? Between people who are in close contact with one another (within about 6 feet). ? Through respiratory droplets produced when an infected person coughs, sneezes or talks. ? These droplets can land in the mouths or noses of people who are nearby or possibly be inhaled into the lungs. ? COVID-19 may be spread by people who are not showing symptoms. Everyone should Clean your hands often  Wash your hands often with soap and water for at least 20 seconds especially after you have been in a public place, or after blowing your nose, coughing, or sneezing.  If soap and water are not readily available, use a hand sanitizer that contains at least 60% alcohol. Cover all surfaces of your hands and rub them together until they feel dry.  Avoid touching your eyes, nose, and mouth with unwashed hands. Avoid close contact  Limit contact with others as much as possible.  Avoid close contact with people who are sick.  Put distance between yourself and other people. ? Remember that some people without symptoms may be able to spread virus. ? This is especially important for people who are at higher risk of getting very sick.www.cdc.gov/coronavirus/2019-ncov/need-extra-precautions/people-at-higher-risk.html Cover your mouth and nose with a mask when around others  You could spread COVID-19 to others even if you do not feel sick.  Everyone should wear a mask in public settings and when around people not living in their household, especially when social distancing is difficult to maintain. ? Masks should not be placed on young children under age 2, anyone who   has trouble breathing, or is unconscious, incapacitated or otherwise unable to remove the mask without assistance.  The mask is meant to protect other people in case you are infected.  Do NOT use a facemask meant for a healthcare worker.  Continue to keep about 6 feet between yourself and others. The  mask is not a substitute for social distancing. Cover coughs and sneezes  Always cover your mouth and nose with a tissue when you cough or sneeze or use the inside of your elbow.  Throw used tissues in the trash.  Immediately wash your hands with soap and water for at least 20 seconds. If soap and water are not readily available, clean your hands with a hand sanitizer that contains at least 60% alcohol. Clean and disinfect  Clean AND disinfect frequently touched surfaces daily. This includes tables, doorknobs, light switches, countertops, handles, desks, phones, keyboards, toilets, faucets, and sinks. www.cdc.gov/coronavirus/2019-ncov/prevent-getting-sick/disinfecting-your-home.html  If surfaces are dirty, clean them: Use detergent or soap and water prior to disinfection.  Then, use a household disinfectant. You can see a list of EPA-registered household disinfectants here. cdc.gov/coronavirus 04/12/2019 This information is not intended to replace advice given to you by your health care provider. Make sure you discuss any questions you have with your health care provider. Document Revised: 04/20/2019 Document Reviewed: 02/16/2019 Elsevier Patient Education  2020 Elsevier Inc.  

## 2020-04-28 NOTE — Evaluation (Signed)
Occupational Therapy Evaluation Patient Details Name: Stacy Contreras MRN: [id number] DOB: 1941-02-11 Today's Date: 04/28/2020    History of Present Illness 79 y.o. female with HTN, DM type II,  DVT/PE on Eliquis, ILD/asthma, on 1-2 LNC at baseline,  breast cancer in remission, CAD s/p PCI to LAD who presented with dyspnea x 5-7days, generalize malaise, body aches, fever, chills, dry cough and covid + per home test and [facility] test.   Clinical Impression   Pt admitted with COVID Pt currently with functional limitations due to the deficits listed below (see OT Problem List).  Pt will benefit from skilled OT to increase their safety and independence with ADL and functional mobility for ADL to facilitate discharge to venue listed below.  \ Pt has oxygen at home and usually wears at night     Follow Up Recommendations  No OT follow up    Equipment Recommendations  None recommended by OT    Recommendations for Other Services       Precautions / Restrictions Precautions Precaution Comments: monitor O2      Mobility Bed Mobility Overal bed mobility: Modified Independent                Transfers Overall transfer level: Needs assistance Equipment used: Rolling walker (2 wheeled) Transfers: Sit to/from [employer] Transfers Sit to Stand: Min guard Stand pivot transfers: Min guard            Balance   Sitting-balance support: Feet supported;No upper extremity supported Sitting balance-Leahy Scale: Good     Standing balance support: During functional activity;Single extremity supported Standing balance-Leahy Scale: Good                             ADL either performed or assessed with clinical judgement   ADL Overall ADL's : Needs assistance/impaired                                       General ADL Comments: pt overall S- min A with ADL activity.  Husband will A as needed.  Pts O2 sats did drop to 84 but pt able to recover to 90 on  3L of oxygen     Vision Patient Visual Report: No change from baseline              Pertinent Vitals/Pain Pain Assessment: No/denies pain        Extremity/Trunk Assessment         Cervical / Trunk Assessment Cervical / Trunk Assessment: Normal   Communication Communication Communication: No difficulties   Cognition Arousal/Alertness: Awake/alert Behavior During Therapy: WFL for tasks assessed/performed Overall Cognitive Status: Within Functional Limits for tasks assessed                                 General Comments: pt A&O x4   General Comments               Home Living Family/patient expects to be discharged to:: Private residence Living Arrangements: Spouse/significant other Available Help at Discharge: Family;Friend(s);Available 24 hours/day Type of Home: House Home Access: Stairs to enter [employer] of Steps: 8 total (4 then 4) Entrance Stairs-Rails: Right;Left (uses R side) Home Layout: Laundry or work area in basement (in the process of moving laundry up to main floor)  Bathroom Shower/Tub: Runner, broadcasting/film/video: Gilmer Mor - single point;Shower seat - built in;Other (comment) (1L O2 baseline)          Prior Functioning/Environment Level of Independence: Independent        Comments: Pt reports independent with bathing and dressing, ambulates community distances without AD, pt denies recent falls. 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 Pt reports weekly house cleaner and spouse completes laundry.        OT Problem List: Cardiopulmonary status limiting activity;Decreased strength      OT Treatment/Interventions: Self-care/ADL training;Therapeutic activities;Patient/family education;Energy conservation    OT Goals(Current goals can be found in the care plan section) Acute Rehab OT Goals Patient Stated Goal: return home to spouse OT Goal Formulation: With patient Time For Goal  Achievement: 05/05/20 Potential to Achieve Goals: Good  OT Frequency: Min 2X/week    AM-PAC OT "6 Clicks" Daily Activity     Outcome Measure Help from another person eating meals?: None Help from another person taking care of personal grooming?: A Little Help from another person toileting, which includes using toliet, bedpan, or urinal?: None Help from another person bathing (including washing, rinsing, drying)?: A Little Help from another person to put on and taking off regular upper body clothing?: None Help from another person to put on and taking off regular lower body clothing?: A Little 6 Click Score: 21   End of Session Nurse Communication: Mobility status  Activity Tolerance: Patient tolerated treatment well Patient left: with call bell/phone within reach;in bed  OT Visit Diagnosis: Unsteadiness on feet (R26.81);Other abnormalities of gait and mobility (R26.89)                Time: 9767-3419 OT Time Calculation (min): 20 min Charges:  OT General Charges $OT Visit: 1 Visit OT Evaluation $OT Eval Low Complexity: 1 Low  Lise Auer, OT Acute Rehabilitation Services [phone] Office- [phone]     Zuzu Befort, Karin Golden D 04/28/2020, 4:06 PM

## 2020-04-28 NOTE — TOC Initial Note (Signed)
Transition of Care [facility]) - Initial/Assessment Note    Patient Details  Name: Stacy Contreras MRN: [id number] Date of Birth: 1941/01/14  [facility]) CM/SW Contact:    Elliot Gault, LCSW Phone Number: 04/28/2020, 2:42 PM  Clinical Narrative:                  Pt admitted from home. She resides with her husband in [place]. Per MD, anticipating dc home tomorrow with need for HH and O2. Spoke with pt by phone to assess. Discussed HH and Home O2 and pt is agreeable. Per pt, she has an O2 pack she carries with her when needed due to interstitial lung disease. She states that it is 77-50 years old. She states that she does not have a Home concentrator.  Pt agreeable to referral to [facility] for Home O2 and 3in1, and for [facility] needs.   Assigned TOC will follow up Monday AM to refer for above.   Expected Discharge Plan: Home w Home Health Services Barriers to Discharge: Continued Medical Work up   Patient Goals and CMS Choice Patient states their goals for this hospitalization and ongoing recovery are:: go home CMS Medicare.gov Compare Post Acute Care list provided to:: Patient Choice offered to / list presented to : Patient  Expected Discharge Plan and Services Expected Discharge Plan: Home w Home Health Services In-house Referral: Clinical Social Work   Post Acute Care Choice: Home Health, Durable Medical Equipment Living arrangements for the past 2 months: Single Family Home                                      Prior Living Arrangements/Services Living arrangements for the past 2 months: Single Family Home Lives with:: Spouse Patient language and need for interpreter reviewed:: Yes        Need for Family Participation in Patient Care: Yes (Comment) Care giver support system in place?: Yes (comment) Current home services: DME Criminal Activity/Legal Involvement Pertinent to Current Situation/Hospitalization: No - Comment as needed  Activities of Daily  Living Home Assistive Devices/Equipment: CBG Meter, Blood pressure cuff, Eyeglasses, Walker (specify type) (front wheeled walker) ADL Screening (condition at time of admission) Patient's cognitive ability adequate to safely complete daily activities?: Yes Is the patient deaf or have difficulty hearing?: No Does the patient have difficulty seeing, even when wearing glasses/contacts?: No Patient able to express need for assistance with ADLs?: Yes Does the patient have difficulty dressing or bathing?: No Independently performs ADLs?: Yes (appropriate for developmental age) Does the patient have difficulty walking or climbing stairs?: Yes (secondary to weakness) Weakness of Legs: Both  Permission Sought/Granted Permission sought to share information with : Oceanographer granted to share information with : Yes, Verbal Permission Granted     Permission granted to share info w AGENCY: [facility]        Emotional Assessment   Attitude/Demeanor/Rapport: Engaged Affect (typically observed): Pleasant Orientation: : Oriented to Self, Oriented to Place, Oriented to  Time, Oriented to Situation Alcohol / Substance Use: Not Applicable Psych Involvement: No (comment)  Admission diagnosis:  Hypoxia [R09.02] Pneumonia due to COVID-19 virus [U07.1, J12.82] COVID-19 [U07.1] Patient Active Problem List   Diagnosis Date Noted  . Acute respiratory distress syndrome (ARDS) due to COVID-19 virus (HCC) 04/24/2020  . Pneumonia due to COVID-19 virus 04/24/2020  . Interstitial lung disorders (HCC) 05/08/2016  . Dyspnea  and respiratory abnormality 02/24/2016  . UTI (lower urinary tract infection) 12/12/2015  . Bronchitis 12/12/2015  . Hypoxia 12/12/2015  . Coronary artery disease 12/12/2015  . Diabetes mellitus without complication (HCC) 12/12/2015  . Hypertension 12/12/2015  . DVT (deep venous thrombosis) (HCC) 12/12/2015  . ILD (interstitial lung disease)  (HCC) 12/12/2015  . Acute on chronic respiratory failure with hypoxia (HCC) 12/12/2015  . SOB (shortness of breath) 12/11/2015   PCP:  Alinda Deem, MD Pharmacy:   [facility] - [place], [place] - [address] [place], [address] [place], [address] [place] [address] Phone: [phone] Fax: [phone]  [phone] [place], [place] - [address] [place] [address] Phone: [phone] Fax: [phone]     Social Determinants of Health (SDOH) Interventions    Readmission Risk Interventions No flowsheet data found.

## 2020-04-28 NOTE — Progress Notes (Signed)
SATURATION QUALIFICATIONS: (This note is used to comply with regulatory documentation for home oxygen)  Patient Saturations on Room Air at Rest = 79%  Patient Saturations on 8Liters of oxygen while Ambulating = 84-90%  Please briefly explain why patient needs home oxygen: Patient desats with minimal exertion with labored breathing, she will need oxygen to keep her O2 at appropriate saturation levels.

## 2020-04-29 LAB — CULTURE, BLOOD (ROUTINE X 2)
Culture: NO GROWTH
Culture: NO GROWTH
Special Requests: ADEQUATE
Special Requests: ADEQUATE

## 2020-04-29 LAB — BASIC METABOLIC PANEL
Anion gap: 14 (ref 5–15)
BUN: 40 mg/dL — ABNORMAL HIGH (ref 8–23)
CO2: 28 mmol/L (ref 22–32)
Calcium: 8.8 mg/dL — ABNORMAL LOW (ref 8.9–10.3)
Chloride: 88 mmol/L — ABNORMAL LOW (ref 98–111)
Creatinine, Ser: 1.23 mg/dL — ABNORMAL HIGH (ref 0.44–1.00)
GFR calc Af Amer: 49 mL/min — ABNORMAL LOW (ref >60)
GFR calc non Af Amer: 42 mL/min — ABNORMAL LOW (ref >60)
Glucose, Bld: 199 mg/dL — ABNORMAL HIGH (ref 70–99)
Potassium: 4 mmol/L (ref 3.5–5.1)
Sodium: 130 mmol/L — ABNORMAL LOW (ref 135–145)

## 2020-04-29 LAB — CBC
HCT: 37.4 % (ref 36.0–46.0)
Hemoglobin: 12.8 g/dL (ref 12.0–15.0)
MCH: 31.1 pg (ref 26.0–34.0)
MCHC: 34.2 g/dL (ref 30.0–36.0)
MCV: 90.8 fL (ref 80.0–100.0)
Platelets: 431 10*3/uL — ABNORMAL HIGH (ref 150–400)
RBC: 4.12 MIL/uL (ref 3.87–5.11)
RDW: 12.2 % (ref 11.5–15.5)
WBC: 8.8 10*3/uL (ref 4.0–10.5)
nRBC: 0 % (ref 0.0–0.2)

## 2020-04-29 LAB — GLUCOSE, CAPILLARY
Glucose-Capillary: 165 mg/dL — ABNORMAL HIGH (ref 70–99)
Glucose-Capillary: 110 mg/dL — ABNORMAL HIGH (ref 70–99)

## 2020-04-29 LAB — C-REACTIVE PROTEIN: CRP: 0.7 mg/dL (ref <1.0)

## 2020-04-29 LAB — LACTATE DEHYDROGENASE: LDH: 195 U/L — ABNORMAL HIGH (ref 98–192)

## 2020-04-29 MED ORDER — PREDNISONE 10 MG PO TABS: 10.0000 mg | ORAL_TABLET | Freq: Every day | ORAL | 0 refills | Status: DC

## 2020-04-29 MED ORDER — PANTOPRAZOLE SODIUM 40 MG PO TBEC: 40.0000 mg | DELAYED_RELEASE_TABLET | Freq: Every day | ORAL | 0 refills | Status: DC

## 2020-04-29 MED ORDER — ALBUTEROL SULFATE HFA 108 (90 BASE) MCG/ACT IN AERS: 2.0000 | INHALATION_SPRAY | Freq: Three times a day (TID) | RESPIRATORY_TRACT | 0 refills | Status: DC

## 2020-04-29 MED ORDER — ATENOLOL 25 MG PO TABS: 12.5000 mg | ORAL_TABLET | Freq: Two times a day (BID) | ORAL | 0 refills | Status: DC

## 2020-04-29 MED ORDER — AZITHROMYCIN 250 MG PO TABS: ORAL_TABLET | ORAL | 0 refills | Status: DC

## 2020-04-29 MED ORDER — ZINC SULFATE 220 (50 ZN) MG PO CAPS: 220.0000 mg | ORAL_CAPSULE | Freq: Every day | ORAL | 0 refills | Status: AC

## 2020-04-29 MED ORDER — BARICITINIB 2 MG PO TABS: 2.0000 mg | ORAL_TABLET | Freq: Every day | ORAL | 0 refills | Status: AC

## 2020-04-29 MED ORDER — ASCORBIC ACID 500 MG PO TABS: 500.0000 mg | ORAL_TABLET | Freq: Every day | ORAL | 0 refills | Status: DC

## 2020-04-29 MED ORDER — ZINC SULFATE 220 (50 ZN) MG PO CAPS: 220.0000 mg | ORAL_CAPSULE | Freq: Every day | ORAL | 0 refills | Status: DC

## 2020-04-29 MED ORDER — BARICITINIB 2 MG PO TABS: 2.0000 mg | ORAL_TABLET | Freq: Every day | ORAL | Status: DC

## 2020-04-29 MED ORDER — ASCORBIC ACID 500 MG PO TABS: 500.0000 mg | ORAL_TABLET | Freq: Every day | ORAL | 0 refills | Status: AC

## 2020-04-29 MED ORDER — ISOSORBIDE MONONITRATE ER 30 MG PO TB24: 15.0000 mg | ORAL_TABLET | Freq: Every day | ORAL | 0 refills | Status: DC

## 2020-04-29 MED FILL — ATENOLOL 25 MG TABLET: 25 | 30 days supply | Qty: 30 | Fill #0

## 2020-04-29 MED FILL — ISOSORBIDE MONONITRATE ER 3: 30 | 30 days supply | Qty: 15 | Fill #0

## 2020-04-29 MED FILL — ALBUTEROL SULFATE HFA 108 (: 108 (90 BAS | 25 days supply | Qty: 9 | Fill #0

## 2020-04-29 MED FILL — PANTOPRAZOLE SOD DR 40 MG T: 40 | 20 days supply | Qty: 20 | Fill #0

## 2020-04-29 MED FILL — AZITHROMYCIN 250 MG TABS: 250 | 5 days supply | Qty: 5 | Fill #0

## 2020-04-29 NOTE — Care Management Important Message (Signed)
Important Message  Patient Details  Name: LANDRI DORSAINVIL MRN: [id number] Date of Birth: 1941-05-01   Medicare Important Message Given:  Medicare IM printed for [facility], to give to the patient     Chari Manning 04/29/2020, 11:06 AM

## 2020-04-29 NOTE — TOC Transition Note (Signed)
Transition of Care [facility]) - CM/SW Discharge Note   Patient Details  Name: Stacy Contreras MRN: [id number] Date of Birth: 1941-05-27  Transition of Care [facility] CM/SW Contact:  Ida Rogue, LCSW Phone Number: 04/29/2020, 9:55 AM   Clinical Narrative:   Patient who is discharging today is in need of DME bent metal, O2 and HH PT OT.  Called [facility] at [phone] to order O2.  They will deliver concentrator and travel cannister for husband to bring when he comes to pick patient up.  Called [facility] at [phone] to order [facility] PT, OT.  FAXed required paperwork to both places.  Called [employer] for delivery of bent metal.  No further needs identified. TOC sign off.    Final next level of care: Home w Home Health Services Barriers to Discharge: Barriers Resolved   Patient Goals and CMS Choice Patient states their goals for this hospitalization and ongoing recovery are:: go home CMS Medicare.gov Compare Post Acute Care list provided to:: Patient Choice offered to / list presented to : Patient  Discharge Placement                       Discharge Plan and Services In-house Referral: Clinical Social Work   Post Acute Care Choice: Home Health, Durable Medical Equipment                               Social Determinants of Health (SDOH) Interventions     Readmission Risk Interventions No flowsheet data found.

## 2020-04-29 NOTE — Progress Notes (Signed)
Physical Therapy Treatment Patient Details Name: Stacy Contreras MRN: [id number] DOB: 01/25/1941 Today's Date: 04/29/2020    History of Present Illness 79 y.o. female with HTN, DM type II,  DVT/PE on Eliquis, ILD/asthma, on 1-2 LNC at baseline,  breast cancer in remission, CAD s/p PCI to LAD who presented with dyspnea x 5-7days, generalize malaise, body aches, fever, chills, dry cough and covid + per home test and [facility] test.    PT Comments    The patient is to Dc home today. Patient instructed in HEP and [provided written program. Patient remains on 4 l O2( finger sensor) SPO2 94%.  Follow Up Recommendations  Home health PT;Supervision - Intermittent     Equipment Recommendations  3in1 (PT)    Recommendations for Other Services       Precautions / Restrictions Precautions Precaution Comments: monitor O2    Mobility  Bed Mobility               General bed mobility comments: in recliner  Transfers     Transfers: Sit to/from Stand Sit to Stand: Supervision            Ambulation/Gait             General Gait Details: nt   Stairs             Wheelchair Mobility    Modified Rankin (Stroke Patients Only)       Balance Overall balance assessment: No apparent balance deficits (not formally assessed)                                          Cognition Arousal/Alertness: Awake/alert                                            Exercises General Exercises - Lower Extremity Long Arc Quad: AROM;Both;5 reps;Seated Straight Leg Raises: AROM;Both;Supine;5 reps Hip Flexion/Marching: AROM;Standing;Both;5 reps Toe Raises: AROM;Standing;Both;10 reps;5 reps Heel Raises: Both;5 reps;Standing Mini-Sqauts: AROM;Both;5 reps;Standing    General Comments        Pertinent Vitals/Pain Pain Assessment: No/denies pain    Home Living                      Prior Function            PT Goals (current goals can  now be found in the care plan section) Progress towards PT goals: Progressing toward goals    Frequency    Min 3X/week      PT Plan Current plan remains appropriate    Co-evaluation              AM-PAC PT "6 Clicks" Mobility   Outcome Measure  Help needed turning from your back to your side while in a flat bed without using bedrails?: None Help needed moving from lying on your back to sitting on the side of a flat bed without using bedrails?: None Help needed moving to and from a bed to a chair (including a wheelchair)?: A Little Help needed standing up from a chair using your arms (e.g., wheelchair or bedside chair)?: A Little Help needed to walk in hospital room?: A Little Help needed climbing 3-5 steps with a railing? : A Little 6 Click Score: 20  End of Session Equipment Utilized During Treatment: Oxygen Activity Tolerance: Patient tolerated treatment well Patient left: in chair;with call bell/phone within reach Nurse Communication: Mobility status;Other (comment) PT Visit Diagnosis: Unsteadiness on feet (R26.81);Other abnormalities of gait and mobility (R26.89);Muscle weakness (generalized) (M62.81)     Time: 4332-9518 PT Time Calculation (min) (ACUTE ONLY): 10 min  Charges:                        Blanchard Kelch PT Acute Rehabilitation Services Pager [phone] Office [phone]    Rada Hay 04/29/2020, 12:33 PM

## 2020-04-29 NOTE — Progress Notes (Signed)
Discharge instructions discussed with patient, verbalized agreement and understanding 

## 2020-04-29 NOTE — Discharge Summary (Addendum)
Discharge Summary  Stacy Contreras JYN:[id number] DOB: 09-May-1941  PCP: Stacy Deem, MD  Admit date: 04/23/2020 Discharge date: 04/29/2020  Time spent: 35 minutes   Recommendations for Outpatient Follow-up:  1. Follow-up with your pulmonologist within a week. 2. Follow-up with your PCP 3. Take your medications as prescribed 4. Continue PT OT with assistance and fall precautions 5. Please abide by CDC recommendations for isolation.  Discharge Diagnoses:  Active Hospital Problems   Diagnosis Date Noted  . Acute respiratory distress syndrome (ARDS) due to COVID-19 virus (HCC) 04/24/2020  . Pneumonia due to COVID-19 virus 04/24/2020  . Interstitial lung disorders (HCC) 05/08/2016  . Coronary artery disease 12/12/2015  . Diabetes mellitus without complication (HCC) 12/12/2015  . Hypertension 12/12/2015  . DVT (deep venous thrombosis) (HCC) 12/12/2015  . ILD (interstitial lung disease) (HCC) 12/12/2015  . SOB (shortness of breath) 12/11/2015    Resolved Hospital Problems  No resolved problems to display.    Discharge Condition: Stable  Diet recommendation: Resume previous diet.  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  Associated with generalized malaise, body aches, fever 103.6, chills and nonproductive cough.  Due to worsening dyspnea and hypoxia her PCP advised to come to the ED for further evaluation and management.  ED Course: -CXR with lower lobe opacities consistent with Covid infection. -Labs with -CRP 16.8 -Lactic acid  1.4 -D-dimer 0.41 -Procalcitonin less than 0.10 -LDH 201 -Ferritin 229 -Fibrinogen 596 -Covid PCR positive on 04/23/2020. -WBC 7.5, Hb 12.2, platelets 225 -Sodium 131, potassium 4.2, BUN 11, creatinine 0.99 -Albumin 3.6, AST 28, ALT 25, ALP 49.  Seen by pulmonary, advised to continue to hold off Cellcept.    Discussed with pulmonary Dr. Isaiah Contreras on 04/29/2020, recommended to continue to hold off CellCept for least 2 to 3 weeks.  Defers restarting CellCept to the patient's primary pulmonologist at [facility].    Hospitalization complicated by increase in oxygen requirement which improved greatly with COVID-19 directed therapies.    Received 5 days of remdesivir, 5 days of baricitinib and IV solumedrol, bronchodilators, pulmonary toilet, and O2 supplementation to maintain O2 saturation greater than 90%.  Recent right wrist laceration for which hand surgery Dr. Dion Contreras was consulted.  R wrist sutures were placed at [facility] ED and removed on 04/28/20 by Hand Surgery Dr. Dion Contreras.    04/29/20: Seen and examined.    No acute events overnight.  Reviewed ABG done on 04/28/2020 showing significant improvement of hypoxia: 7.507/32.4/78.8.   Hospital Course:  Active Problems:   SOB (shortness of breath)   Coronary artery disease   Diabetes mellitus without complication (HCC)   Hypertension   DVT (deep venous thrombosis) (HCC)   ILD (interstitial lung disease) (HCC)   Interstitial lung disorders (HCC)   Acute respiratory distress syndrome (ARDS) due to COVID-19 virus (HCC)   Pneumonia due to COVID-19 virus  COVID-19 viral pneumonia Positive COVID-19 screening test on 04/23/20. Received COVID-19 directed therapies, completed 5 days of remdesivir Received 5/14 days of baricitinib, continue, 9 more days to complete the course.   Switched  Decadron to IV Solu-Medrol on 04/25/2020, continue prednisone taper. Continue Vitamin D3, zinc, zinc Continue bronchodilators, albuterol inhaler 2 puffs every 8  hours Added Zpack for its anti-inflammatory properties Incentive spirometer, mobilize as tolerated Pronation or side sleeping. Inflammatory markers are trending down. Maintain O2 saturation greater than 90%. Much improved hypoxia on ABG with PaO2 of 78.8 on nasal cannula.  Fingertip pulse oximeter likely unreliable due to nail polish and fingernail gel.  Persistent Acute on chronic hypoxic respiratory failure secondary to COVID-19 viral pneumonia On 1 to 2 L nasal cannula PRN at baseline. Repeat chest x-ray done this morning, personally reviewed, which is showing atelectasis in the bases. Oxygen requirement is improving Continue incentive spirometer and mobilize as tolerated. Follow-up with your pulmonologist and wean off oxygen as tolerated.  Essential HTN BP was soft initially, now at goal after reducing doses of antihypertensives. Continue to hold home norvasc to avoid hypotension. Follow-up with your PCP.  Interstitial lung disease/pleuropulmonary fibroelastosis Followed by pulmonary at [facility]. Continue to hold off CellCept for 2 to 3 weeks as recommended by our pulmonologist. Defer to your pulmonologist to restart.  Follow-up with your pulmonologist within a week. She is on Bactrim prophylactically Monday Wednesday Friday, continue.  Type 2 diabetes with hyperglycemia, likely exacerbated by steroids Hemoglobin A1c 6.7 on 2020-01-23 Resume home regimen.  Right wrist laceration with stitches post fall, POA Stitches removed by hand surgery Dr. Dion Contreras We highly appreciate assistance. Will follow up with Dr. Dion Contreras outpatient, if patient desires.  History of DVT/PE Continue Eliquis  GERD Table Continue PPI  Chronic anxiety/depression Stable Continue Paxil  Polyneuropathy Stable Continue gabapentin  Physical debility PT recommended home health PT OT Continue PT OT with assistance and fall precautions     Code Status: Full  code    Consultants:  Pulmonary  Procedures:  None  Antimicrobials:  None  DVT prophylaxis: Eliquis   Discharge Exam: BP 110/61 (BP Location: Right Arm)   Pulse (!) 59   Temp 97.9 F (36.6 C)   Resp (!) 22   Ht  (1.651 m)   Wt 68 kg   SpO2 90%   BMI 24.96 kg/m  . General: 79 y.o. year-old female well developed well nourished in no acute distress.  Alert and oriented x3. . Cardiovascular: Regular rate and rhythm with no rubs or gallops.  No thyromegaly or JVD noted.   Marland Kitchen Respiratory: Clear to auscultation with no wheezes or rales. . Abdomen: Soft nontender nondistended with normal bowel sounds x4 quadrants. . Musculoskeletal: No lower extremity edema bilaterally. Marland Kitchen Psychiatry: Mood is appropriate for condition and setting  Discharge Instructions You were cared for by a hospitalist during your hospital stay. If you have any questions about your discharge medications or the care you received while you were in the hospital after you are discharged, you can call the unit and asked to speak with the hospitalist on call if the hospitalist that took care of you is not available. Once you are discharged, your primary care physician will handle any further medical issues. Please note that NO REFILLS for any discharge medications will be authorized once you are discharged, as it is imperative that you return to your primary care physician (or establish a relationship with a primary care physician if you do not have one) for your aftercare needs so that they can reassess your need for medications and monitor your lab values.   Allergies as of 04/29/2020      Reactions   Codeine  Other (See Comments)   Fast heart beat (tolerates hydrocodone)   Statins Other (See Comments)   Muscle weakness   Sulfa Antibiotics Rash   Takes Bactrim      Medication List    STOP taking these medications   amLODipine 5 MG tablet Commonly known as: NORVASC   HYDROcodone-homatropine  5-1.5 MG/5ML syrup Commonly known as: HYCODAN   mycophenolate 500 MG tablet Commonly known as: CELLCEPT   traMADol 50 MG tablet Commonly known as: ULTRAM     TAKE these medications   acetaminophen 500 MG tablet Commonly known as: TYLENOL Take 1,000 mg by mouth 2 (two) times daily as needed for headache (pain).   albuterol 108 (90 Base) MCG/ACT inhaler Commonly known as: VENTOLIN HFA Inhale 2 puffs into the lungs every 4 (four) hours as needed for wheezing or shortness of breath. What changed: Another medication with the same name was added. Make sure you understand how and when to take each.   albuterol 108 (90 Base) MCG/ACT inhaler Commonly known as: VENTOLIN HFA Inhale 2 puffs into the lungs every 8 (eight) hours for 10 days. What changed: You were already taking a medication with the same name, and this prescription was added. Make sure you understand how and when to take each.   apixaban 5 MG Tabs tablet Commonly known as: Eliquis Take 0.5 tablets (2.5 mg total) by mouth 2 (two) times daily. Hold on Eliquis the day prior to Bronchoscopy What changed: additional instructions   ascorbic acid 500 MG tablet Commonly known as: VITAMIN C Take 1 tablet (500 mg total) by mouth daily.   atenolol 25 MG tablet Commonly known as: TENORMIN Take 0.5 tablets (12.5 mg total) by mouth 2 (two) times daily. What changed: how much to take   azithromycin 250 MG tablet Commonly known as: ZITHROMAX Take 1 tablet 250 mg daily x 5 days   baricitinib tablet Commonly known as: OLUMIANT Take 1 tablet (2 mg total) by mouth daily for 9 days. Start taking on: April 30, 2020   cycloSPORINE 0.05 % ophthalmic emulsion Commonly known as: RESTASIS Place 1 drop into both eyes 2 (two) times daily.   docusate sodium 100 MG capsule Commonly known as: COLACE Take 200 mg by mouth at bedtime.   FISH OIL PO Take 1,400 mg by mouth daily.   fluticasone 50 MCG/ACT nasal spray Commonly known as:  FLONASE Place 1 spray into both nostrils daily as needed for allergies or rhinitis.   Fluticasone-Salmeterol 250-50 MCG/DOSE Aepb Commonly known as: ADVAIR Inhale 1 puff into the lungs 2 (two) times daily.   gabapentin 300 MG capsule Commonly known as: NEURONTIN Take 600 mg by mouth at bedtime.   isosorbide mononitrate 30 MG 24 hr tablet Commonly known as: IMDUR Take 0.5 tablets (15 mg total) by mouth at bedtime. What changed: how much to take   losartan 100 MG tablet Commonly known as: COZAAR Take 100 mg by mouth at bedtime.   metFORMIN 500 MG tablet Commonly known as: GLUCOPHAGE Take 1,000 mg by mouth 2 (two) times daily with a meal.   nitroGLYCERIN 0.4 MG SL tablet Commonly known as: NITROSTAT Place 0.4 mg under the tongue every 5 (five) minutes as needed for chest pain.   OXYGEN Inhale 2 L into the lungs as needed (shortness of breath/ low oxygen stats).   pantoprazole 40 MG tablet Commonly known as: PROTONIX Take 1 tablet (40 mg total) by mouth daily for 20 days.   PARoxetine 20 MG tablet Commonly  known as: PAXIL Take 10 mg by mouth 2 (two) times daily.   predniSONE 10 MG tablet Commonly known as: DELTASONE Take 1 tablet (10 mg total) by mouth daily. -Take 40 mg daily x 3 days, then -Take 30 mg daily x 3 days, then -Take 20 mg daily x 3 days, then -Take 10 mg daily x 3 days, then -Take 5 mg daily x 3 days, then stop   sitaGLIPtin 100 MG tablet Commonly known as: JANUVIA Take 100 mg by mouth daily with supper.   sulfamethoxazole-trimethoprim 800-160 MG tablet Commonly known as: BACTRIM DS Take 1 tablet by mouth 3 (three) times a week. Takes on Monday, Wednesday, Friday   Vitamin D3 50 MCG (2000 UT) capsule Take 2,000 Units by mouth daily.   zinc sulfate 220 (50 Zn) MG capsule Take 1 capsule (220 mg total) by mouth daily.   zolpidem 10 MG tablet Commonly known as: AMBIEN Take 5 mg by mouth at bedtime.            Durable Medical Equipment   (From admission, onward)         Start     Ordered   04/29/20 1014  For home use only DME oxygen  Once       Question Answer Comment  Length of Need 6 Months   Mode or (Route) Nasal cannula   Liters per Minute 4   Frequency Continuous (stationary and portable oxygen unit needed)   Oxygen conserving device Yes   Oxygen delivery system Gas      04/29/20 1013   04/29/20 0943  For home use only DME Walker  Once       Question:  Patient needs a walker to treat with the following condition  Answer:  Ambulatory dysfunction   04/29/20 0942   04/27/20 0608  For home use only DME 3 n 1  Once        04/27/20 0607         Allergies  Allergen Reactions  . Codeine Other (See Comments)    Fast heart beat (tolerates hydrocodone)  . Statins Other (See Comments)    Muscle weakness  . Sulfa Antibiotics Rash    Takes Bactrim    Follow-up Information    Stacy Deem, MD. Call in 1 day(s).   Specialty: Family Medicine Why: Please call for a post hospital follow-up appointment. Contact information: [address] [place] [address] [phone]        Teryl Lucy, MD. Call in 1 day(s).   Specialty: Orthopedic Surgery Why: Please call for a post hospital follow-up appointment. Contact information: [address] [place] [address] [phone]        Tamera Reason, MD. Call in 1 day(s).   Specialty: Pulmonary Disease Why: please call for a post hospital follow up appointment Contact information: [address] [place] [address] [phone]                The results of significant diagnostics from this hospitalization (including imaging, microbiology, ancillary and laboratory) are listed below for reference.    Significant Diagnostic Studies: DG Chest 2 View  Result Date: 04/23/2020 CLINICAL DATA:  Shortness of breath, COVID positive, history of interstitial lung disease. History of breast cancer. Diabetes. History of DVT.  History of asthma. EXAM: CHEST - 2 VIEW COMPARISON:  Chest x-ray 05/26/2016. CT chest 01/20/2016 FINDINGS: The heart size and mediastinal contours are within normal limits. Aortic arch calcifications. Coronary artery stent  in similar position. Redemonstration of right mid and lower lung zone pulmonary staples. Interval development of bilateral lower lung zone patchy airspace opacities. Persistent biapical pleural/pulmonary scarring. Suggestion of a right upper lobe granuloma. No pulmonary edema. No pleural effusion. No pneumothorax. No acute osseous abnormality. Surgical clips overlie the upper abdomen. IMPRESSION: Bilateral lower lung zone pulmonary findings consistent with known COVID 19 infection. Electronically Signed   By: Tish Frederickson M.D.   On: 04/23/2020 20:21   DG CHEST PORT 1 VIEW  Result Date: 04/28/2020 CLINICAL DATA:  Hypertension, diabetes, DVT/PE. Body aches fever chills. EXAM: PORTABLE CHEST 1 VIEW COMPARISON:  04/23/20 FINDINGS: Normal heart size. Calcified mediastinal lymph nodes are again noted. Hazy lung opacity overlying the right scratch set hazy lung opacity within the right midlung and right base is increased from previous exam. Atelectasis is identified within the left lung base. No signs of pleural effusion. IMPRESSION: Increased hazy lung opacity within the right midlung and right base. Electronically Signed   By: Signa Kell M.D.   On: 04/28/2020 08:38    Microbiology: Recent Results (from the past 240 hour(s))  SARS Coronavirus 2 by RT PCR (hospital order, performed in [facility] hospital lab) Nasopharyngeal Nasopharyngeal Swab     Status: Abnormal   Collection Time: 04/23/20 11:00 PM   Specimen: Nasopharyngeal Swab  Result Value Ref Range Status   SARS Coronavirus 2 POSITIVE (A) NEGATIVE Final    Comment: RESULT CALLED TO, READ BACK BY AND VERIFIED WITH: RN J SMITH AT 0010 04/24/20 CRUICKSHANK A (NOTE) SARS-CoV-2 target nucleic acids are DETECTED  SARS-CoV-2 RNA  is generally detectable in upper respiratory specimens  during the acute phase of infection.  Positive results are indicative  of the presence of the identified virus, but do not rule out bacterial infection or co-infection with other pathogens not detected by the test.  Clinical correlation with patient history and  other diagnostic information is necessary to determine patient infection status.  The expected result is negative.  Fact Sheet for Patients:   BoilerBrush.com.cy   Fact Sheet for Healthcare Providers:   https://pope.com/    This test is not yet approved or cleared by the [place] FDA and  has been authorized for detection and/or diagnosis of SARS-CoV-2 by FDA under an Emergency Use Authorization (EUA).  This EUA will remain in effect (meaning t his test can be used) for the duration of  the COVID-19 declaration under Section 564(b)(1) of the Act, 21 U.S.C. section 360-bbb-3(b)(1), unless the authorization is terminated or revoked sooner.  Performed at [facility], [address], [place], [place] [address]   Blood Culture (routine x 2)     Status: None (Preliminary result)   Collection Time: 04/23/20 11:13 PM   Specimen: BLOOD  Result Value Ref Range Status   Specimen Description   Final    BLOOD BLOOD LEFT FOREARM Performed at [facility] [place] [facility], [address] [place], [place] [address]    Special Requests   Final    BOTTLES DRAWN AEROBIC AND ANAEROBIC Blood Culture adequate volume Performed at [facility], [address], [place], [place] [address]    Culture   Final    NO GROWTH 4 DAYS Performed at [facility], [address], [place], [place] [address]    Report Status PENDING  Incomplete  Blood Culture (routine x 2)     Status: None (Preliminary result)   Collection Time: 04/23/20 11:13 PM  Specimen: BLOOD  Result Value Ref Range Status    Specimen Description   Final    BLOOD RIGHT ANTECUBITAL Performed at [facility], [address] [place], [place] [address]    Special Requests   Final    BOTTLES DRAWN AEROBIC AND ANAEROBIC Blood Culture adequate volume Performed at [facility], [address] [place], [place] [address]    Culture   Final    NO GROWTH 4 DAYS Performed at [facility], [address] [place], [place] [address]    Report Status PENDING  Incomplete     Labs: Basic Metabolic Panel: Recent Labs  Lab 04/25/20 0300 04/26/20 0350 04/27/20 1726 04/28/20 0525 04/29/20 0504  NA 135 132* 129* 128* 130*  K 3.5 4.5 4.4 4.6 4.0  CL 101 98 91* 94* 88*  CO2 24 23 24 23 28   GLUCOSE 153* 230* 152* 230* 199*  BUN 14 19 24* 29* 40*  CREATININE 0.84 0.76 0.85 1.00 1.23*  CALCIUM 7.8* 8.4* 9.3 8.7* 8.8*   Liver Function Tests: Recent Labs  Lab 04/23/20 2313 04/24/20 0316 04/25/20 0300 04/26/20 0350 04/27/20 1726  AST 28 32 25 24 33  ALT 25 26 21 21  32  ALKPHOS 49 52 46 49 52  BILITOT 0.6 0.5 0.6 0.4 0.7  PROT 7.1 7.4 6.2* 6.8 7.3  ALBUMIN 3.6 3.6 3.0* 3.3* 3.7   No results for input(s): LIPASE, AMYLASE in the last 168 hours. No results for input(s): AMMONIA in the last 168 hours. CBC: Recent Labs  Lab 04/23/20 2313 04/24/20 0316 04/25/20 0300 04/26/20 0350 04/27/20 1726 04/28/20 0525 04/29/20 0504  WBC 7.5   < > 7.6 5.0 8.9 7.0 8.8  NEUTROABS 6.9  --   --   --  7.0  --   --   HGB 12.2   < > 11.7* 11.8* 13.1 12.5 12.8  HCT 36.9   < > 35.3* 35.4* 37.9 36.3 37.4  MCV 93.9   < > 93.4 93.2 90.2 91.0 90.8  PLT 225   < > 269 313 456* 395 431*   < > = values in this interval not displayed.   Cardiac Enzymes: No results for input(s): CKTOTAL, CKMB, CKMBINDEX, TROPONINI in the last 168 hours. BNP: BNP (last 3 results) Recent Labs    04/28/20 0525  BNP 87.2    ProBNP (last 3 results) No results for input(s): PROBNP in the last 8760  hours.  CBG: Recent Labs  Lab 04/28/20 1158 04/28/20 1610 04/28/20 2050 04/29/20 0750 04/29/20 1145  GLUCAP 121* 204* 120* 165* 110*       Signed:  Darlin Droparole N Kylani Wires, MD [facility] 04/29/2020, 12:21 PM

## 2022-12-15 ENCOUNTER — Encounter: Payer: Self-pay | Admitting: Physician Assistant

## 2023-02-01 ENCOUNTER — Encounter (INDEPENDENT_AMBULATORY_CARE_PROVIDER_SITE_OTHER): Payer: Self-pay

## 2023-02-05 ENCOUNTER — Ambulatory Visit (INDEPENDENT_AMBULATORY_CARE_PROVIDER_SITE_OTHER): Payer: Medicare Other | Admitting: Internal Medicine

## 2023-02-05 ENCOUNTER — Telehealth: Payer: Self-pay | Admitting: Internal Medicine

## 2023-02-05 ENCOUNTER — Encounter: Payer: Self-pay | Admitting: Internal Medicine

## 2023-02-05 ENCOUNTER — Other Ambulatory Visit (INDEPENDENT_AMBULATORY_CARE_PROVIDER_SITE_OTHER): Payer: Medicare Other

## 2023-02-05 VITALS — BP 116/62 | HR 58 | Ht 65.0 in | Wt 164.0 lb

## 2023-02-05 DIAGNOSIS — R1011 Right upper quadrant pain: Secondary | ICD-10-CM | POA: Diagnosis not present

## 2023-02-05 DIAGNOSIS — E779 Disorder of glycoprotein metabolism, unspecified: Secondary | ICD-10-CM

## 2023-02-05 DIAGNOSIS — R112 Nausea with vomiting, unspecified: Secondary | ICD-10-CM

## 2023-02-05 DIAGNOSIS — K219 Gastro-esophageal reflux disease without esophagitis: Secondary | ICD-10-CM | POA: Diagnosis not present

## 2023-02-05 LAB — HEPATIC FUNCTION PANEL
ALT: 21 U/L (ref 0–35)
AST: 22 U/L (ref 0–37)
Albumin: 4.4 g/dL (ref 3.5–5.2)
Alkaline Phosphatase: 39 U/L (ref 39–117)
Bilirubin, Direct: 0.1 mg/dL (ref 0.0–0.3)
Total Bilirubin: 0.5 mg/dL (ref 0.2–1.2)
Total Protein: 6.8 g/dL (ref 6.0–8.3)

## 2023-02-05 LAB — HIGH SENSITIVITY CRP: CRP, High Sensitivity: 0.39 mg/L (ref 0.000–5.000)

## 2023-02-05 LAB — LIPASE: Lipase: 22 U/L (ref 11.0–59.0)

## 2023-02-05 NOTE — Telephone Encounter (Signed)
Inbound call from patient stating she needs to reschedule procedure at [facility] on 7/29 due to being out of town. Requesting a call back. Please advise, thank you.

## 2023-02-05 NOTE — Progress Notes (Signed)
Chief Complaint: RUQ ab pain, N&V  HPI : 82 year old female with history of DM, DVT/PE on Eliquis, ILD/asthma, SLE, Sjogren's syndrome, ILD on 1-2 L [place] oxygen PRN, CAD s/p PCI, breast cancer s/p radiation therapy p/w RUQ abdominal pain  She has had RUQ ab pain for the last 3 months. The pain is located in the RUQ and comes-and-goes She holds her abdomen due to the pain. Sometimes she will have N&V. She is not sure what brings on the pain. She may have had this pain in the past, but the pain has never this severe. Denies NSAIDs. Endorses chest burning and regurgitation that sometimes wakes her up in the middle of the night. She will usually take an Alka-Seltzer, which helps. Tums has not helped with her abdominal pain. She has had her gallbladder taken out in the past about 20 years ago. She has occasional diarrhea due to IBS. Denies blood in the stools. Denies dysphagia or weight loss. She had an colonoscopy 10 years ago that showed two colon polyps. She also had an EGD at the same time, and her esophagus was stretched. Her EGD/colonoscopy were previously performed in [place] but she does not recall the name of her prior GI physician. She has been doing Cologuard tests for colon cancer screening. Last one was 1.5 years ago that was normal. She has family history of colon cancer in her father (diagnosed in his 54s) and grandfather. She used to have one alcoholic beverage per day, but she has stopped drinking alcohol about a month ago. Denies any new medications. Denies marijuana use.  Wt Readings from Last 3 Encounters:  02/05/23 164 lb (74.4 kg)  04/23/20 150 lb (68 kg)  05/26/16 145 lb (65.8 kg)   Past Medical History:  Diagnosis Date   Arthritis    Asthma    Coronary artery disease    Diabetes mellitus without complication (HCC)    DVT (deep venous thrombosis) (HCC)    GERD (gastroesophageal reflux disease)    History of breast cancer    Hypertension    Interstitial lung disease (HCC)     Kidney stones    Shortness of breath dyspnea      Past Surgical History:  Procedure Laterality Date   APPENDECTOMY     CARDIAC CATHETERIZATION     CHOLECYSTECTOMY     COLONOSCOPY     CORONARY ANGIOPLASTY     ESOPHAGOGASTRODUODENOSCOPY     EYE SURGERY     cataracts   hysterectomy     left knee replacement     LUNG BIOPSY Right 05/08/2016   Procedure: LUNG BIOPSY;  Surgeon: Loreli Slot, MD;  Location: [facility] OR;  Service: Thoracic;  Laterality: Right;   right breast lumpectomy     stents x 2     VIDEO ASSISTED THORACOSCOPY Right 05/08/2016   Procedure: VIDEO ASSISTED THORACOSCOPY;  Surgeon: Loreli Slot, MD;  Location: [facility] OR;  Service: Thoracic;  Laterality: Right;   VIDEO BRONCHOSCOPY Bilateral 12/19/2015   Procedure: VIDEO BRONCHOSCOPY WITHOUT FLUORO;  Surgeon: Kalman Shan, MD;  Location: [facility]COPY;  Service: Endoscopy;  Laterality: Bilateral;   VIDEO BRONCHOSCOPY N/A 05/08/2016   Procedure: VIDEO BRONCHOSCOPY;  Surgeon: Loreli Slot, MD;  Location: [facility] OR;  Service: Thoracic;  Laterality: N/A;   Family History  Problem Relation Age of Onset   Lung disease Mother        ? brown lung   Diabetes Mother    Colon cancer Father  Breast cancer Sister    Colon cancer Paternal Grandfather    Breast cancer Daughter    Liver disease Neg Hx    Esophageal cancer Neg Hx    Social History   Tobacco Use   Smoking status: Never   Smokeless tobacco: Never  Vaping Use   Vaping Use: Never used  Substance Use Topics   Alcohol use: Yes    Alcohol/week: 0.0 standard drinks of alcohol    Comment: occasional   Drug use: No   Current Outpatient Medications  Medication Sig Dispense Refill   acetaminophen (TYLENOL) 500 MG tablet Take 1,000 mg by mouth 2 (two) times daily as needed for headache (pain).     apixaban (ELIQUIS) 5 MG TABS tablet Take 0.5 tablets (2.5 mg total) by mouth 2 (two) times daily. Hold on Eliquis the day prior to Bronchoscopy (Patient taking  differently: Take 2.5 mg by mouth 2 (two) times daily.) 60 tablet    Cholecalciferol (VITAMIN D3) 2000 units capsule Take 2,000 Units by mouth daily.     fluticasone (FLONASE) 50 MCG/ACT nasal spray Place 1 spray into both nostrils daily as needed for allergies or rhinitis.     gabapentin (NEURONTIN) 300 MG capsule Take 300 mg by mouth 3 (three) times daily.     losartan (COZAAR) 100 MG tablet Take 100 mg by mouth at bedtime.     metFORMIN (GLUCOPHAGE) 500 MG tablet Take 1,000 mg by mouth 2 (two) times daily with a meal.     nitroGLYCERIN (NITROSTAT) 0.4 MG SL tablet Place 0.4 mg under the tongue every 5 (five) minutes as needed for chest pain.     Omega-3 Fatty Acids (FISH OIL PO) Take 1,400 mg by mouth daily.     OXYGEN Inhale 2 L into the lungs as needed (shortness of breath/ low oxygen stats).     PARoxetine (PAXIL) 20 MG tablet Take 10 mg by mouth 2 (two) times daily.      isosorbide mononitrate (IMDUR) 30 MG 24 hr tablet Take 0.5 tablets (15 mg total) by mouth at bedtime. 15 tablet 0   No current facility-administered medications for this visit.   Allergies  Allergen Reactions   Codeine Other (See Comments)    Fast heart beat (tolerates hydrocodone)   Statins Other (See Comments)    Muscle weakness   Sulfa Antibiotics Rash    Takes Bactrim   Review of Systems: All systems reviewed and negative except where noted in HPI.   Physical Exam: BP 116/62   Pulse (!) 58   Ht 5\' 5"  (1.651 m)   Wt 164 lb (74.4 kg)   BMI 27.29 kg/m  Constitutional: Pleasant,well-developed, female in no acute distress. HEENT: Normocephalic and atraumatic. Conjunctivae are normal. No scleral icterus. Cardiovascular: Normal rate, regular rhythm.  Pulmonary/chest: Effort normal and breath sounds normal. No wheezing, rales or rhonchi. Abdominal: Soft, nondistended, tender in the RUQ and epigastric areas Bowel sounds active throughout. There are no masses palpable. No hepatomegaly. Extremities: No  edema Neurological: Alert and oriented to person place and time. Skin: Skin is warm and dry. No rashes noted. Psychiatric: Normal mood and affect. Behavior is normal.  Labs 12/2022: CBC and CMP unremarkable. TSH nml.   ASSESSMENT AND PLAN: RUQ/epigastric abdominal pain N&V GERD Family history of colon cancer Patient presents with RUQ/epigastric ab pain that started 3 months ago. Not clear that there are any inciting factors for her abdominal pain. Patient also describes uncontrolled GERD. Sources of her ab pain and N&V  includes pancreatitis, PUD, GERD, and/or cholelithiasis. Will plan for further evaluation with labs, RUQ U/S, and EGD. Will start her on daily PPI therapy and antiemetics to see if this helsp with her symptoms. Based upon her family history of colon cancer in her father, she needs colonoscopies for colon cancer screening in the future - Start omeprazole 40 mg every day - Start Zofran 4 mg TID PRN - Check LFTs, lipase level, CRP, TTG IgA, IgA - RUQ U/S to look for CBD dilation - EGD, which was originally scheduled at [facility]. However, after review with Cathlyn Parsons, patient has been okayed for a procedure in the [facility]. We will contact the patient to see if she would like to get her procedure performed in the [facility] instead. Will need Eliquis held 2 days beforehand.  - Will also need colonoscopy in the future for colon cancer screening but can wait for her ab pain and N&V to improved before pursuing this  Eulah Pont, MD  I spent 60 minutes of time, including in depth chart review, independent review of results as outlined above, communicating results with the patient directly, face-to-face time with the patient, coordinating care, ordering studies and medications as appropriate, and documentation.

## 2023-02-05 NOTE — Patient Instructions (Addendum)
Your provider has requested that you go to the basement level for lab work before leaving today. Press "B" on the elevator. The lab is located at the first door on the left as you exit the elevator.  You have been scheduled for an endoscopy. Please follow written instructions given to you at your visit today. If you use inhalers (even only as needed), please bring them with you on the day of your procedure.  You will be contacted by our office prior to your procedure for directions on holding your Eliquis.  If you do not hear from our office 1 week prior to your scheduled procedure, please call [phone] to discuss.   If your blood pressure at your visit was 140/90 or greater, please contact your primary care physician to follow up on this.  _______________________________________________________  If you are age 44 or older, your body mass index should be between 23-30. Your Body mass index is 27.29 kg/m. If this is out of the aforementioned range listed, please consider follow up with your Primary Care Provider.  If you are age 58 or younger, your body mass index should be between 19-25. Your Body mass index is 27.29 kg/m. If this is out of the aformentioned range listed, please consider follow up with your Primary Care Provider.   ________________________________________________________  The [facility] providers would like to encourage you to use [facility] to communicate with providers for non-urgent requests or questions.  Due to long hold times on the telephone, sending your provider a message by [facility] may be a faster and more efficient way to get a response.  Please allow 48 business hours for a response.  Please remember that this is for non-urgent requests.  _______________________________________________________   Due to recent changes in healthcare laws, you may see the results of your imaging and laboratory studies on MyChart before your provider has had a chance to review them.   We understand that in some cases there may be results that are confusing or concerning to you. Not all laboratory results come back in the same time frame and the provider may be waiting for multiple results in order to interpret others.  Please give [place] 48 hours in order for your provider to thoroughly review all the results before contacting the office for clarification of your results.    Thank you for entrusting me with your care and for choosing [facility], Dr. Eulah Pont

## 2023-02-06 ENCOUNTER — Encounter: Payer: Self-pay | Admitting: Internal Medicine

## 2023-02-06 LAB — TISSUE TRANSGLUTAMINASE, IGA: (tTG) Ab, IgA: <1 U/mL

## 2023-02-08 ENCOUNTER — Telehealth: Payer: Self-pay

## 2023-02-08 ENCOUNTER — Other Ambulatory Visit: Payer: Self-pay

## 2023-02-08 LAB — IGA: Immunoglobulin A: 66 mg/dL — ABNORMAL LOW (ref 70–320)

## 2023-02-08 MED ORDER — OMEPRAZOLE 40 MG PO CPDR: 40.0000 mg | DELAYED_RELEASE_CAPSULE | Freq: Every day | ORAL | 3 refills | Status: DC

## 2023-02-08 MED ORDER — ONDANSETRON HCL 4 MG PO TABS
4.0000 mg | ORAL_TABLET | Freq: Three times a day (TID) | ORAL | 1 refills | Status: AC | PRN
Start: 2023-02-08 — End: ?

## 2023-02-08 NOTE — Addendum Note (Signed)
Addended by: Dennison Mascot on: 02/08/2023 12:04 PM   Modules accepted: Orders

## 2023-02-12 ENCOUNTER — Telehealth: Payer: Self-pay

## 2023-02-12 NOTE — Telephone Encounter (Signed)
Called [facility] spoke with nurse Vernona Rieger advised patient is scheduled to have a endoscopy done on 02/16/23. We need to know if patient can hold Eliquis 2 days prior to procedure I faxed anti coag letter Vernona Rieger stated she will get Dr Carola Rhine to sign off on it and fax it back to [place].

## 2023-02-12 NOTE — Telephone Encounter (Signed)
Received fax from cardiology notified patient it is okay to hold Eliquis 2 day's prior to procedure. Patient verbalized understanding

## 2023-02-16 ENCOUNTER — Ambulatory Visit: Payer: Medicare Other | Admitting: Internal Medicine

## 2023-02-16 ENCOUNTER — Encounter: Payer: Self-pay | Admitting: Internal Medicine

## 2023-02-16 VITALS — BP 130/76 | HR 56 | Temp 97.3°F | Resp 18 | Ht 65.0 in | Wt 164.0 lb

## 2023-02-16 DIAGNOSIS — R112 Nausea with vomiting, unspecified: Secondary | ICD-10-CM | POA: Diagnosis not present

## 2023-02-16 DIAGNOSIS — K219 Gastro-esophageal reflux disease without esophagitis: Secondary | ICD-10-CM

## 2023-02-16 DIAGNOSIS — R1011 Right upper quadrant pain: Secondary | ICD-10-CM

## 2023-02-16 DIAGNOSIS — K2990 Gastroduodenitis, unspecified, without bleeding: Secondary | ICD-10-CM

## 2023-02-16 MED ORDER — FLUCONAZOLE 100 MG PO TABS
100.0000 mg | ORAL_TABLET | Freq: Every day | ORAL | 0 refills | Status: DC
Start: 2023-02-16 — End: 2023-12-03

## 2023-02-16 MED ORDER — SODIUM CHLORIDE 0.9 % IV SOLN
500.0000 mL | Freq: Once | INTRAVENOUS | Status: DC
Start: 2023-02-16 — End: 2023-02-16

## 2023-02-16 NOTE — Progress Notes (Signed)
Sedate, gd SR, tolerated procedure well, VSS, report to RN 

## 2023-02-16 NOTE — Op Note (Addendum)
[facility] Patient Name: Stacy Contreras Procedure Date: 02/16/2023 10:45 AM MRN: [id number] Endoscopist: Particia Lather , , [id number] Age: 82 Referring MD:  Date of Birth: September 19, 1940 Gender: Female Account #: [id number] Procedure:                Upper GI endoscopy Indications:              Epigastric abdominal pain, Abdominal pain in the                            right upper quadrant, Heartburn, Nausea with                            vomiting Medicines:                Monitored Anesthesia Care Procedure:                Pre-Anesthesia Assessment:                           - Prior to the procedure, a History and Physical                            was performed, and patient medications and                            allergies were reviewed. The patient's tolerance of                            previous anesthesia was also reviewed. The risks                            and benefits of the procedure and the sedation                            options and risks were discussed with the patient.                            All questions were answered, and informed consent                            was obtained. Prior Anticoagulants: The patient has                            taken Eliquis (apixaban), last dose was 3 days                            prior to procedure. ASA Grade Assessment: II - A                            patient with mild systemic disease. After reviewing                            the risks and benefits, the patient was deemed in  satisfactory condition to undergo the procedure.                           After obtaining informed consent, the endoscope was                            passed under direct vision. Throughout the                            procedure, the patient's blood pressure, pulse, and                            oxygen saturations were monitored continuously. The                            GIF [id number] #[id number] was  introduced through the                            mouth, and advanced to the second part of duodenum.                            The upper GI endoscopy was accomplished without                            difficulty. The patient tolerated the procedure                            well. Scope In: Scope Out: Findings:                 White nummular lesions were noted in the entire                            esophagus. Biopsies were taken with a cold forceps                            for histology.                           Retained fluid was found in the gastric body.                           Localized mild inflammation characterized by                            congestion (edema) and erythema was found in the                            gastric antrum. Biopsies were taken with a cold                            forceps for histology.                           Localized nodular mucosa was found in the duodenal  bulb. Biopsies were taken with a cold forceps for                            histology.                           Localized mild inflammation characterized by                            congestion (edema) and erythema was found in the                            duodenal bulb. Biopsies were taken with a cold                            forceps for histology. Complications:            No immediate complications. Estimated Blood Loss:     Estimated blood loss was minimal. Impression:               - White nummular lesions in esophageal mucosa.                            Biopsied.                           - Retained gastric fluid.                           - Gastritis. Biopsied.                           - Nodular mucosa in the duodenal bulb. Biopsied.                           - Duodenitis. Biopsied. Recommendation:           - Discharge patient to home (with escort).                           - Await pathology results.                           - Use  Prilosec (omeprazole) 40 mg PO BID for 10                            weeks.                           - Fluconazole 400 mg x 1 dose, followed by 200 mg                            QD for 13 days for presumed candida esophagitis.                           - Okay to restart your Eliquis this evening.                           -  Return to GI clinic in 8 weeks or next available.                           - The findings and recommendations were discussed                            with the patient. Dr Particia Lather "Alan Ripper" Leonides Schanz,  02/16/2023 11:14:26 AM

## 2023-02-16 NOTE — Patient Instructions (Addendum)
Resume your Eliquis this evening.  Take your prolosec twice daily for 10 weeks.  Be sure to take it on an empty stomach.  Take your fluconazole 4 tablets tonight, and two tablets for the next 13 days. Read the discharge instructions.  There are no openings for an office visit through October.  Patient will call to schedule.  YOU HAD AN ENDOSCOPIC PROCEDURE TODAY AT THE [facility]:   Refer to the procedure report that was given to you for any specific questions about what was found during the examination.  If the procedure report does not answer your questions, please call your gastroenterologist to clarify.  If you requested that your care partner not be given the details of your procedure findings, then the procedure report has been included in a sealed envelope for you to review at your convenience later.  YOU SHOULD EXPECT: Some feelings of bloating in the abdomen. Passage of more gas than usual.  Walking can help get rid of the air that was put into your GI tract during the procedure and reduce the bloating.   Please Note:  You might notice some irritation and congestion in your nose or some drainage.  This is from the oxygen used during your procedure.  There is no need for concern and it should clear up in a day or so.  SYMPTOMS TO REPORT IMMEDIATELY:  Following upper endoscopy (EGD)  Vomiting of blood or coffee ground material  New chest pain or pain under the shoulder blades  Painful or persistently difficult swallowing  New shortness of breath  Fever of 100F or higher  Black, tarry-looking stools  For urgent or emergent issues, a gastroenterologist can be reached at any hour by calling [phone]. Do not use MyChart messaging for urgent concerns.    DIET:  We do recommend a small meal at first, but then you may proceed to your regular diet.  Drink plenty of fluids but you should avoid alcoholic beverages for 24 hours.  ACTIVITY:  You should plan to take it easy for  the rest of today and you should NOT DRIVE or use heavy machinery until tomorrow (because of the sedation medicines used during the test).    FOLLOW UP: Our staff will call the number listed on your records the next business day following your procedure.  We will call around 7:15- 8:00 am to check on you and address any questions or concerns that you may have regarding the information given to you following your procedure. If we do not reach you, we will leave a message.     If any biopsies were taken you will be contacted by phone or by letter within the next 1-3 weeks.  Please call us at [phone] if you have not heard about the biopsies in 3 weeks.    SIGNATURES/CONFIDENTIALITY: You and/or your care partner have signed paperwork which will be entered into your electronic medical record.  These signatures attest to the fact that that the information above on your After Visit Summary has been reviewed and is understood.  Full responsibility of the confidentiality of this discharge information lies with you and/or your care-partner.

## 2023-02-16 NOTE — Progress Notes (Signed)
GASTROENTEROLOGY PROCEDURE H&P NOTE   Primary Care Physician: Alinda Deem, MD    Reason for Procedure:   RUQ/epigastric ab pain, N&V, GERD  Plan:    EGD  Patient is appropriate for endoscopic procedure(s) in the ambulatory ([facility]) setting.  The nature of the procedure, as well as the risks, benefits, and alternatives were carefully and thoroughly reviewed with the patient. Ample time for discussion and questions allowed. The patient understood, was satisfied, and agreed to proceed.     HPI: Stacy Contreras is a 82 y.o. female who presents for EGD for evaluation of RUQ/epigastric ab pain, N&V, GERD .  Patient was most recently seen in the Gastroenterology Clinic on 02/05/23.  No interval change in medical history since that appointment. Please refer to that note for full details regarding GI history and clinical presentation.   Past Medical History:  Diagnosis Date   Arthritis    Asthma    Coronary artery disease    Diabetes mellitus without complication (HCC)    DVT (deep venous thrombosis) (HCC)    GERD (gastroesophageal reflux disease)    History of breast cancer    Hypertension    Interstitial lung disease (HCC)    Kidney stones    Shortness of breath dyspnea     Past Surgical History:  Procedure Laterality Date   APPENDECTOMY     CARDIAC CATHETERIZATION     CHOLECYSTECTOMY     COLONOSCOPY     CORONARY ANGIOPLASTY     ESOPHAGOGASTRODUODENOSCOPY     EYE SURGERY     cataracts   hysterectomy     left knee replacement     LUNG BIOPSY Right 05/08/2016   Procedure: LUNG BIOPSY;  Surgeon: Loreli Slot, MD;  Location: [facility] OR;  Service: Thoracic;  Laterality: Right;   right breast lumpectomy     stents x 2     VIDEO ASSISTED THORACOSCOPY Right 05/08/2016   Procedure: VIDEO ASSISTED THORACOSCOPY;  Surgeon: Loreli Slot, MD;  Location: [facility] OR;  Service: Thoracic;  Laterality: Right;   VIDEO BRONCHOSCOPY Bilateral 12/19/2015   Procedure: VIDEO BRONCHOSCOPY  WITHOUT FLUORO;  Surgeon: Kalman Shan, MD;  Location: [facility]COPY;  Service: Endoscopy;  Laterality: Bilateral;   VIDEO BRONCHOSCOPY N/A 05/08/2016   Procedure: VIDEO BRONCHOSCOPY;  Surgeon: Loreli Slot, MD;  Location: [facility] OR;  Service: Thoracic;  Laterality: N/A;    Prior to Admission medications   Medication Sig Start Date End Date Taking? Authorizing Provider  acetaminophen (TYLENOL) 500 MG tablet Take 1,000 mg by mouth 2 (two) times daily as needed for headache (pain).    [provider]  apixaban (ELIQUIS) 5 MG TABS tablet Take 0.5 tablets (2.5 mg total) by mouth 2 (two) times daily. Hold on Eliquis the day prior to Bronchoscopy Patient taking differently: Take 2.5 mg by mouth 2 (two) times daily. 12/13/15   Zannie Cove, MD  Cholecalciferol (VITAMIN D3) 2000 units capsule Take 2,000 Units by mouth daily.    [provider]  fluticasone (FLONASE) 50 MCG/ACT nasal spray Place 1 spray into both nostrils daily as needed for allergies or rhinitis.    [provider]  gabapentin (NEURONTIN) 300 MG capsule Take 300 mg by mouth 3 (three) times daily.    [provider]  isosorbide mononitrate (IMDUR) 30 MG 24 hr tablet Take 0.5 tablets (15 mg total) by mouth at bedtime. 04/29/20 05/29/20  Darlin Drop, DO  losartan (COZAAR) 100 MG tablet Take 100 mg by mouth  at bedtime.    [provider]  metFORMIN (GLUCOPHAGE) 500 MG tablet Take 1,000 mg by mouth 2 (two) times daily with a meal.    [provider]  nitroGLYCERIN (NITROSTAT) 0.4 MG SL tablet Place 0.4 mg under the tongue every 5 (five) minutes as needed for chest pain.    [provider]  Omega-3 Fatty Acids (FISH OIL PO) Take 1,400 mg by mouth daily.    [provider]  omeprazole (PRILOSEC) 40 MG capsule Take 1 capsule (40 mg total) by mouth daily before breakfast. 02/08/23   Imogene Burn, MD  ondansetron (ZOFRAN) 4 MG tablet Take 1 tablet (4 mg total) by mouth  3 (three) times daily as needed for nausea or vomiting. 02/08/23   Imogene Burn, MD  OXYGEN Inhale 2 L into the lungs as needed (shortness of breath/ low oxygen stats).    [provider]  PARoxetine (PAXIL) 20 MG tablet Take 10 mg by mouth 2 (two) times daily.     [provider]    Current Outpatient Medications  Medication Sig Dispense Refill   acetaminophen (TYLENOL) 500 MG tablet Take 1,000 mg by mouth 2 (two) times daily as needed for headache (pain).     apixaban (ELIQUIS) 5 MG TABS tablet Take 0.5 tablets (2.5 mg total) by mouth 2 (two) times daily. Hold on Eliquis the day prior to Bronchoscopy (Patient taking differently: Take 2.5 mg by mouth 2 (two) times daily.) 60 tablet    Cholecalciferol (VITAMIN D3) 2000 units capsule Take 2,000 Units by mouth daily.     fluticasone (FLONASE) 50 MCG/ACT nasal spray Place 1 spray into both nostrils daily as needed for allergies or rhinitis.     gabapentin (NEURONTIN) 300 MG capsule Take 300 mg by mouth 3 (three) times daily.     isosorbide mononitrate (IMDUR) 30 MG 24 hr tablet Take 0.5 tablets (15 mg total) by mouth at bedtime. 15 tablet 0   losartan (COZAAR) 100 MG tablet Take 100 mg by mouth at bedtime.     metFORMIN (GLUCOPHAGE) 500 MG tablet Take 1,000 mg by mouth 2 (two) times daily with a meal.     nitroGLYCERIN (NITROSTAT) 0.4 MG SL tablet Place 0.4 mg under the tongue every 5 (five) minutes as needed for chest pain.     Omega-3 Fatty Acids (FISH OIL PO) Take 1,400 mg by mouth daily.     omeprazole (PRILOSEC) 40 MG capsule Take 1 capsule (40 mg total) by mouth daily before breakfast. 30 capsule 3   ondansetron (ZOFRAN) 4 MG tablet Take 1 tablet (4 mg total) by mouth 3 (three) times daily as needed for nausea or vomiting. 30 tablet 1   OXYGEN Inhale 2 L into the lungs as needed (shortness of breath/ low oxygen stats).     PARoxetine (PAXIL) 20 MG tablet Take 10 mg by mouth 2 (two) times daily.      Current  Facility-Administered Medications  Medication Dose Route Frequency Provider Last Rate Last Admin   0.9 %  sodium chloride infusion  500 mL Intravenous Once Imogene Burn, MD        Allergies as of 02/16/2023 - Review Complete 02/16/2023  Allergen Reaction Noted   Codeine Other (See Comments), Palpitations, and Shortness Of Breath 12/11/2015   Statins Other (See Comments) 12/11/2015   Sulfa antibiotics Rash 12/11/2015    Family History  Problem Relation Age of Onset   Lung disease Mother        ?  brown lung   Diabetes Mother    Colon cancer Father    Breast cancer Sister    Colon cancer Paternal Grandfather    Breast cancer Daughter    Liver disease Neg Hx    Esophageal cancer Neg Hx     Social History   Socioeconomic History   Marital status: Married    Spouse name: Not on file   Number of children: 4   Years of education: Not on file   Highest education level: Not on file  Occupational History   Occupation: Realtor  Tobacco Use   Smoking status: Never   Smokeless tobacco: Never  Vaping Use   Vaping Use: Never used  Substance and Sexual Activity   Alcohol use: Yes    Alcohol/week: 0.0 standard drinks of alcohol    Comment: occasional   Drug use: No   Sexual activity: Not on file  Other Topics Concern   Not on file  Social History Narrative   Not on file   Social Determinants of Health   Financial Resource Strain: Not on file  Food Insecurity: Not on file  Transportation Needs: Not on file  Physical Activity: Not on file  Stress: Not on file  Social Connections: Not on file  Intimate Partner Violence: Not on file    Physical Exam: Vital signs in last 24 hours: BP (!) 135/58   Pulse (!) 53   Temp (!) 97.3 F (36.3 C)   Ht 5\' 5"  (1.651 m)   Wt 164 lb (74.4 kg)   SpO2 96%   BMI 27.29 kg/m  GEN: NAD EYE: Sclerae anicteric ENT: MMM CV: Non-tachycardic Pulm: No increased WOB GI: Soft NEURO:  Alert & Oriented   Eulah Pont, MD [place]  [facility]   02/16/2023 9:59 AM

## 2023-02-17 ENCOUNTER — Telehealth: Payer: Self-pay

## 2023-02-17 NOTE — Telephone Encounter (Signed)
  Follow up Call-     02/16/2023    9:55 AM  Call back number  Post procedure Call Back phone  # [phone]  Permission to leave phone message Yes     Patient questions:  Do you have a fever, pain , or abdominal swelling? No. Pain Score  0 *  Have you tolerated food without any problems? Yes.    Have you been able to return to your normal activities? Yes.    Do you have any questions about your discharge instructions: Diet   No. Medications  No. Follow up visit  No.  Do you have questions or concerns about your Care? No.  Actions: * If pain score is 4 or above: No action needed, pain <4.

## 2023-02-23 ENCOUNTER — Encounter: Payer: Self-pay | Admitting: Internal Medicine

## 2023-02-24 ENCOUNTER — Encounter: Payer: Self-pay | Admitting: Internal Medicine

## 2023-02-25 ENCOUNTER — Ambulatory Visit (INDEPENDENT_AMBULATORY_CARE_PROVIDER_SITE_OTHER)
Admission: RE | Admit: 2023-02-25 | Discharge: 2023-02-25 | Disposition: A | Discharge status: Home / Self Care | Payer: Medicare Other | Source: Ambulatory Visit | Attending: Internal Medicine | Admitting: Internal Medicine

## 2023-02-25 ENCOUNTER — Other Ambulatory Visit: Payer: Self-pay | Admitting: *Deleted

## 2023-02-25 ENCOUNTER — Telehealth: Payer: Self-pay | Admitting: *Deleted

## 2023-02-25 DIAGNOSIS — R109 Unspecified abdominal pain: Secondary | ICD-10-CM

## 2023-02-25 DIAGNOSIS — R197 Diarrhea, unspecified: Secondary | ICD-10-CM | POA: Diagnosis not present

## 2023-02-25 DIAGNOSIS — R1110 Vomiting, unspecified: Secondary | ICD-10-CM | POA: Diagnosis not present

## 2023-02-25 MED ORDER — DICYCLOMINE HCL 10 MG PO CAPS: 10.0000 mg | ORAL_CAPSULE | Freq: Three times a day (TID) | ORAL | 0 refills | Status: DC | PRN

## 2023-02-25 NOTE — Telephone Encounter (Signed)
Called patient and notified of Dr. Derek Mound orders: Bentyl 10mg TID PRN and KUB x-ray, due to complaints of abdominal pain and diarrhea. Informed patient of hours of operation in x-ray department and the location. Medication called to pharmacy, KUB orders placed. Patient understood and agreed.

## 2023-02-26 ENCOUNTER — Ambulatory Visit: Payer: Medicare Other | Admitting: Physician Assistant

## 2023-02-27 ENCOUNTER — Other Ambulatory Visit (HOSPITAL_COMMUNITY): Payer: Self-pay

## 2023-02-27 ENCOUNTER — Telehealth: Payer: Self-pay | Admitting: Pharmacy Technician

## 2023-02-27 NOTE — Telephone Encounter (Signed)
Pharmacy Patient Advocate Encounter  Received notification from [facility] that Prior Authorization for DICYCLOMINE 10MG  has been APPROVED from 1.1.24 to 7.2.20.Marland Kitchen  PA #/Case ID/Reference #: [id number]  Unable to provide test billing.    Received notification from CoverMyMeds that prior authorization for DICYCLOMINE 10MG  is required/requested.   Insurance verification completed.   The patient is insured through [facility] .   Per test claim: PA submitted to [facility] via CoverMyMeds Key/confirmation #/EOC [id number] Status is pending

## 2023-03-08 ENCOUNTER — Ambulatory Visit (HOSPITAL_COMMUNITY): Admit: 2023-03-08 | Payer: Medicare Other | Admitting: Internal Medicine

## 2023-03-08 ENCOUNTER — Encounter (HOSPITAL_COMMUNITY): Payer: Self-pay

## 2023-03-08 SURGERY — ESOPHAGOGASTRODUODENOSCOPY (EGD) WITH PROPOFOL
Anesthesia: Monitor Anesthesia Care

## 2023-03-12 NOTE — Progress Notes (Signed)
Hi Beth, please call the patient to see how she is doing on the Bentyl medication. Her KUB did not show excessive stool burden. If she is not doing well and is still having uncontrolled abdominal pain, then I would recommend that we proceed with a CT A/P w/contrast for further evaluation. Thanks.

## 2023-03-16 ENCOUNTER — Other Ambulatory Visit: Payer: Self-pay

## 2023-03-16 DIAGNOSIS — R112 Nausea with vomiting, unspecified: Secondary | ICD-10-CM

## 2023-03-16 DIAGNOSIS — R109 Unspecified abdominal pain: Secondary | ICD-10-CM

## 2023-03-17 ENCOUNTER — Telehealth: Payer: Self-pay | Admitting: Internal Medicine

## 2023-03-17 NOTE — Telephone Encounter (Signed)
Inbound call from patient stating she had went to  [facility] to have her labs drawn and they did not have the order. Patient gave a number [facility] in [place] to contact if needed,  [phone].   Please advise.

## 2023-03-19 ENCOUNTER — Encounter: Payer: Self-pay | Admitting: Internal Medicine

## 2023-03-19 ENCOUNTER — Telehealth: Payer: Self-pay | Admitting: *Deleted

## 2023-03-19 ENCOUNTER — Other Ambulatory Visit: Payer: Self-pay | Admitting: Internal Medicine

## 2023-03-19 NOTE — Telephone Encounter (Signed)
Faxed the lab orders to [facility] in [place] At [phone]. The lab phone number is [phone]. The lab is closed Friday, Saturday and Sunday.

## 2023-03-19 NOTE — Addendum Note (Signed)
Addended by: Heber Jacobus A on: 03/19/2023 10:52 AM   Modules accepted: Orders

## 2023-03-19 NOTE — Addendum Note (Signed)
Addended by: Heber  A on: 03/19/2023 11:01 AM   Modules accepted: Orders

## 2023-03-19 NOTE — Telephone Encounter (Signed)
Called patient to inform, [facility] faxed information to have patient lab draw (basic metabolic panel) done at the facility in [place], [place].

## 2023-03-22 ENCOUNTER — Other Ambulatory Visit: Payer: Self-pay | Admitting: Internal Medicine

## 2023-03-22 NOTE — Telephone Encounter (Signed)
Patient will return to [facility] today. If they do not have the test orders, she will come to [facility] for her labs. Her imaging study (CT) is 03/24/23.

## 2023-03-24 ENCOUNTER — Ambulatory Visit (HOSPITAL_COMMUNITY)
Admission: RE | Admit: 2023-03-24 | Discharge: 2023-03-24 | Disposition: A | Discharge status: Home / Self Care | Payer: Medicare Other | Source: Ambulatory Visit | Attending: Internal Medicine | Admitting: Internal Medicine

## 2023-03-24 DIAGNOSIS — R1110 Vomiting, unspecified: Secondary | ICD-10-CM | POA: Diagnosis present

## 2023-03-24 DIAGNOSIS — R112 Nausea with vomiting, unspecified: Secondary | ICD-10-CM | POA: Insufficient documentation

## 2023-03-24 DIAGNOSIS — R109 Unspecified abdominal pain: Secondary | ICD-10-CM | POA: Diagnosis present

## 2023-03-24 DIAGNOSIS — E119 Type 2 diabetes mellitus without complications: Secondary | ICD-10-CM | POA: Diagnosis present

## 2023-03-24 DIAGNOSIS — R197 Diarrhea, unspecified: Secondary | ICD-10-CM | POA: Diagnosis present

## 2023-03-24 LAB — POCT I-STAT CREATININE: Creatinine, Ser: 0.7 mg/dL (ref 0.44–1.00)

## 2023-03-24 MED ORDER — IOHEXOL 9 MG/ML PO SOLN
1000.0000 mL | ORAL | Status: AC
  Administered 2023-03-24: 1000 mL via ORAL

## 2023-03-24 MED ORDER — IOHEXOL 300 MG/ML  SOLN
100.0000 mL | Freq: Once | INTRAMUSCULAR | Status: AC | PRN
  Administered 2023-03-24: 100 mL via INTRAVENOUS

## 2023-03-24 MED ORDER — IOHEXOL 9 MG/ML PO SOLN: 500.0000 mL | ORAL | Status: DC

## 2023-03-24 MED ORDER — IOHEXOL 9 MG/ML PO SOLN
ORAL | Status: AC
  Filled 2023-03-24: qty 1000

## 2023-03-29 ENCOUNTER — Encounter: Payer: Self-pay | Admitting: Internal Medicine

## 2023-04-01 ENCOUNTER — Encounter: Payer: Self-pay | Admitting: Internal Medicine

## 2023-04-09 ENCOUNTER — Ambulatory Visit: Payer: Medicare Other | Admitting: Internal Medicine

## 2023-04-09 VITALS — BP 126/70 | HR 56 | Ht 65.0 in | Wt 154.0 lb

## 2023-04-09 DIAGNOSIS — R197 Diarrhea, unspecified: Secondary | ICD-10-CM | POA: Diagnosis not present

## 2023-04-09 DIAGNOSIS — Z800 Family history of malignant neoplasm of digestive organs: Secondary | ICD-10-CM

## 2023-04-09 MED ORDER — NA SULFATE-K SULFATE-MG SULF 17.5-3.13-1.6 GM/177ML PO SOLN: ORAL | 0 refills | Status: DC

## 2023-04-09 MED ORDER — OMEPRAZOLE 40 MG PO CPDR: 40.0000 mg | DELAYED_RELEASE_CAPSULE | Freq: Every day | ORAL | 3 refills | Status: DC

## 2023-04-09 MED ORDER — DICYCLOMINE HCL 10 MG PO CAPS: 10.0000 mg | ORAL_CAPSULE | Freq: Three times a day (TID) | ORAL | 1 refills | Status: DC | PRN

## 2023-04-09 NOTE — Patient Instructions (Addendum)
Your provider has ordered "Diatherix" stool testing for you. You have received a kit from our office today containing all necessary supplies to complete this test. Please carefully read the stool collection instructions provided in the kit before opening the accompanying materials. In addition, be sure to place the label from the top left corner of the laboratory request sheet onto the "puritan opti-swab" tube that is supplied in the kit. This label should include your full name and date of birth. After completing the test, you should secure the purtian tube into the specimen biohazard bag. The laboratory request information sheet (including date and time of specimen collection) should be placed into the outside pocket of the specimen biohazard bag and returned to the [place] lab with 2 days of collection.    You have been scheduled for a colonoscopy. Please follow written instructions given to you at your visit today.   Please pick up your prep supplies at the pharmacy within the next 1-3 days.  If you use inhalers (even only as needed), please bring them with you on the day of your procedure.  DO NOT TAKE 7 DAYS PRIOR TO TEST- Trulicity (dulaglutide) Ozempic, Wegovy (semaglutide) Mounjaro (tirzepatide) Bydureon Bcise (exanatide extended release)  DO NOT TAKE 1 DAY PRIOR TO YOUR TEST Rybelsus (semaglutide) Adlyxin (lixisenatide) Victoza (liraglutide) Byetta (exanatide)  We have sent the following medications to your pharmacy for you to pick up at your convenience: Suprep  Use Pepto Bismol  as needed   You will be contacted by our office prior to your procedure for directions on holding your Eliquis.  If you do not hear from our office 1 week prior to your scheduled procedure, please call [phone] to discuss.    If your blood pressure at your visit was 140/90 or greater, please contact your primary care physician to follow up on  this.  _______________________________________________________  If you are age 72 or older, your body mass index should be between 23-30. Your Body mass index is 25.63 kg/m. If this is out of the aforementioned range listed, please consider follow up with your Primary Care Provider.  If you are age 69 or younger, your body mass index should be between 19-25. Your Body mass index is 25.63 kg/m. If this is out of the aformentioned range listed, please consider follow up with your Primary Care Provider.   ________________________________________________________  The [facility] providers would like to encourage you to use [facility] to communicate with providers for non-urgent requests or questions.  Due to long hold times on the telephone, sending your provider a message by [facility] may be a faster and more efficient way to get a response.  Please allow 48 business hours for a response.  Please remember that this is for non-urgent requests.  _______________________________________________________  Due to recent changes in healthcare laws, you may see the results of your imaging and laboratory studies on MyChart before your provider has had a chance to review them.  We understand that in some cases there may be results that are confusing or concerning to you. Not all laboratory results come back in the same time frame and the provider may be waiting for multiple results in order to interpret others.  Please give [place] 48 hours in order for your provider to thoroughly review all the results before contacting the office for clarification of your results.   Thank you for entrusting me with your care and for choosing [facility], Dr. Eulah Pont

## 2023-04-09 NOTE — Progress Notes (Unsigned)
Chief Complaint: RUQ ab pain, N&V  HPI : 82 year old female with history of DM, DVT/PE on Eliquis, ILD/asthma, SLE, Sjogren's syndrome, ILD on 1-2 L [place] oxygen PRN, CAD s/p PCI, breast cancer s/p radiation therapy p/w RUQ abdominal pain  She has had RUQ ab pain for the last 3 months. The pain is located in the RUQ and comes-and-goes She holds her abdomen due to the pain. Sometimes she will have N&V. She is not sure what brings on the pain. She may have had this pain in the past, but the pain has never this severe. Denies NSAIDs. Endorses chest burning and regurgitation that sometimes wakes her up in the middle of the night. She will usually take an Alka-Seltzer, which helps. Tums has not helped with her abdominal pain. She has had her gallbladder taken out in the past about 20 years ago. She has occasional diarrhea due to IBS. Denies blood in the stools. Denies dysphagia or weight loss. She had an colonoscopy 10 years ago that showed two colon polyps. She also had an EGD at the same time, and her esophagus was stretched. Her EGD/colonoscopy were previously performed in [place] but she does not recall the name of her prior GI physician. She has been doing Cologuard tests for colon cancer screening. Last one was 1.5 years ago that was normal. She has family history of colon cancer in her father (diagnosed in his 5s) and grandfather. She used to have one alcoholic beverage per day, but she has stopped drinking alcohol about a month ago. Denies any new medications. Denies marijuana use.  Interval History: *** She has now been having significant diarrhea. The diarrhea has been going on for 2-3 months. She is having 5-6 BMs per day and up to 12 BMs per day. The stools are mucous like. Endorses nocturnal stools. She has to wear pads and has to take extra clothes. Endorses fecal leakage. The diarrhea has been getting worse. The pain is better after taking Bentyl. She took an antibiotic for pneumonia two weeks  ago. She has taken some OTC Iberogast and Imodium. The Imodium is not helping much. Her N&V has improved. GERD has helped. She doesn't eat as much because she is afraid that she is going to have an accident. Denies prior issues with pancreas in the past. Denies alcohol use. She had her galblladder taken out 15 years ago. She took oemprazole BID, tehn stopped 2 weeks ago, and has not had a recurrence in symptoms.She has lost weight due to fear o fit losing weight.   Wt Readings from Last 3 Encounters:  04/09/23 154 lb (69.9 kg)  02/16/23 164 lb (74.4 kg)  02/05/23 164 lb (74.4 kg)   Past Medical History:  Diagnosis Date   Arthritis    Asthma    Coronary artery disease    Diabetes mellitus without complication (HCC)    DVT (deep venous thrombosis) (HCC)    GERD (gastroesophageal reflux disease)    History of breast cancer    Hypertension    Interstitial lung disease (HCC)    Kidney stones    Oxygen deficiency    Shortness of breath dyspnea      Past Surgical History:  Procedure Laterality Date   APPENDECTOMY     CARDIAC CATHETERIZATION     CHOLECYSTECTOMY     COLONOSCOPY     CORONARY ANGIOPLASTY     ESOPHAGOGASTRODUODENOSCOPY     EYE SURGERY     cataracts   hysterectomy  left knee replacement     LUNG BIOPSY Right 05/08/2016   Procedure: LUNG BIOPSY;  Surgeon: Loreli Slot, MD;  Location: [facility] OR;  Service: Thoracic;  Laterality: Right;   right breast lumpectomy     stents x 2     VIDEO ASSISTED THORACOSCOPY Right 05/08/2016   Procedure: VIDEO ASSISTED THORACOSCOPY;  Surgeon: Loreli Slot, MD;  Location: [facility] OR;  Service: Thoracic;  Laterality: Right;   VIDEO BRONCHOSCOPY Bilateral 12/19/2015   Procedure: VIDEO BRONCHOSCOPY WITHOUT FLUORO;  Surgeon: Kalman Shan, MD;  Location: [facility];  Service: Endoscopy;  Laterality: Bilateral;   VIDEO BRONCHOSCOPY N/A 05/08/2016   Procedure: VIDEO BRONCHOSCOPY;  Surgeon: Loreli Slot, MD;  Location: [facility] OR;   Service: Thoracic;  Laterality: N/A;   Family History  Problem Relation Age of Onset   Lung disease Mother        ? brown lung   Diabetes Mother    Colon cancer Father    Breast cancer Sister    Colon cancer Paternal Grandfather    Breast cancer Daughter    Liver disease Neg Hx    Esophageal cancer Neg Hx    Social History   Tobacco Use   Smoking status: Never   Smokeless tobacco: Never  Vaping Use   Vaping status: Never Used  Substance Use Topics   Alcohol use: Yes    Alcohol/week: 0.0 standard drinks of alcohol    Comment: occasional   Drug use: No   Current Outpatient Medications  Medication Sig Dispense Refill   apixaban (ELIQUIS) 5 MG TABS tablet Take 0.5 tablets (2.5 mg total) by mouth 2 (two) times daily. Hold on Eliquis the day prior to Bronchoscopy (Patient taking differently: Take 2.5 mg by mouth 2 (two) times daily.) 60 tablet    Cholecalciferol (VITAMIN D3) 2000 units capsule Take 2,000 Units by mouth daily.     dicyclomine (BENTYL) 10 MG capsule TAKE 1 CAPSULE BY MOUTH 3 TIMES DAILY AS NEEDED FOR SPASMS (ABDOMINAL PAIN) 270 capsule 1   fluconazole (DIFLUCAN) 100 MG tablet Take 1 tablet (100 mg total) by mouth daily. 30 tablet 0   fluticasone (FLONASE) 50 MCG/ACT nasal spray Place 1 spray into both nostrils daily as needed for allergies or rhinitis.     gabapentin (NEURONTIN) 300 MG capsule Take 300 mg by mouth 3 (three) times daily. Pt take at bedtime     Lifitegrast (XIIDRA) 5 % SOLN Apply to eye.     losartan (COZAAR) 100 MG tablet Take 100 mg by mouth at bedtime.     metFORMIN (GLUCOPHAGE) 500 MG tablet Take 1,000 mg by mouth 2 (two) times daily with a meal.     metoprolol succinate (TOPROL-XL) 50 MG 24 hr tablet Take 50 mg by mouth daily.     nitroGLYCERIN (NITROSTAT) 0.4 MG SL tablet Place 0.4 mg under the tongue every 5 (five) minutes as needed for chest pain.     Omega-3 Fatty Acids (FISH OIL PO) Take 1,400 mg by mouth daily.     omeprazole (PRILOSEC) 40  MG capsule Take 1 capsule (40 mg total) by mouth daily before breakfast. 30 capsule 3   ondansetron (ZOFRAN) 4 MG tablet Take 1 tablet (4 mg total) by mouth 3 (three) times daily as needed for nausea or vomiting. 30 tablet 1   OXYGEN Inhale 2 L into the lungs as needed (shortness of breath/ low oxygen stats).     PARoxetine (PAXIL) 20 MG tablet Take 10 mg by  mouth 2 (two) times daily.      REPATHA SURECLICK 140 MG/ML SOAJ Inject 1 mL into the skin every 14 (fourteen) days.     acetaminophen (TYLENOL) 500 MG tablet Take 1,000 mg by mouth 2 (two) times daily as needed for headache (pain). (Patient not taking: Reported on 04/09/2023)     isosorbide mononitrate (IMDUR) 30 MG 24 hr tablet Take 0.5 tablets (15 mg total) by mouth at bedtime. 15 tablet 0   No current facility-administered medications for this visit.   Allergies  Allergen Reactions   Codeine Other (See Comments), Palpitations and Shortness Of Breath    Fast heart beat (tolerates hydrocodone)  Fast heart beat (tolerates hydrocodone), Fast heart beat (tolerates hydrocodone)   Statins Other (See Comments)    Muscle weakness   Sulfa Antibiotics Rash    Takes Bactrim   Review of Systems: All systems reviewed and negative except where noted in HPI.   Physical Exam: BP 126/70   Pulse (!) 56   Ht 5\' 5"  (1.651 m)   Wt 154 lb (69.9 kg)   BMI 25.63 kg/m  Constitutional: Pleasant,well-developed, female in no acute distress. HEENT: Normocephalic and atraumatic. Conjunctivae are normal. No scleral icterus. Cardiovascular: Normal rate, regular rhythm.  Pulmonary/chest: Effort normal and breath sounds normal. No wheezing, rales or rhonchi. Abdominal: Soft, nondistended, tender in the RUQ and epigastric areas Bowel sounds active throughout. There are no masses palpable. No hepatomegaly. Extremities: No edema Neurological: Alert and oriented to person place and time. Skin: Skin is warm and dry. No rashes noted. Psychiatric: Normal mood  and affect. Behavior is normal.  Labs 12/2022: CBC and CMP unremarkable. TSH nml.   CT A/P w/contrast 03/24/23: IMPRESSION: 1. No acute abnormality or explanation for abdominal pain. 2. Occasional left colonic diverticula without diverticulitis. 3. Small hiatal hernia.  EGD 02/16/23:  Path: 1. Surgical [P], duodenal - BENIGN SMALL BOWEL MUCOSA WITH NO SIGNIFICANT PATHOLOGIC CHANGES 2. Surgical [P], nodular duodenal mucosa - BENIGN SMALL BOWEL MUCOSA WITH FOVEOLAR METAPLASIA, SUGGESTIVE OF PEPTIC INJURY 3. Surgical [P], gastric - MILD REACTIVE GASTROPATHY. - NEGATIVE FOR H. PYLORI ON H&E STAIN - NO INTESTINAL METAPLASIA, DYSPLASIA, OR MALIGNANCY. 4. Surgical [P], esophageal - BENIGN SQUAMOUS MUCOSA WITH NO SPECIFIC PATHOLOGIC CHANGES - NEGATIVE FOR DYSPLASIA OR MALIGNANCY - PAS was negative for fungal organisms  ASSESSMENT AND PLAN: RUQ/epigastric abdominal pain N&V GERD Family history of colon cancer Patient presents with RUQ/epigastric ab pain that started 3 months ago. Not clear that there are any inciting factors for her abdominal pain. Patient also describes uncontrolled GERD. Sources of her ab pain and N&V includes pancreatitis, PUD, GERD, and/or cholelithiasis. Will plan for further evaluation with labs, RUQ U/S, and EGD. Will start her on daily PPI therapy and antiemetics to see if this helsp with her symptoms. Based upon her family history of colon cancer in her father, she needs colonoscopies for colon cancer screening in the future - Check Diatherix with C dif  - Okay to use Pepto Bismol - Colonoscopy [facility]. Will need Eliquis held 2 days beforehand. - Continue Bentyl 10 mg TID PRN  Eulah Pont, MD  I spent 60 minutes of time, including in depth chart review, independent review of results as outlined above, communicating results with the patient directly, face-to-face time with the patient, coordinating care, ordering studies and medications as appropriate, and  documentation.

## 2023-04-16 ENCOUNTER — Telehealth: Payer: Self-pay

## 2023-04-16 NOTE — Telephone Encounter (Signed)
Received fax okay to hold Eliquis 2 day's prior to procedure notified patient she is aware and verbalized understanding.

## 2023-04-28 ENCOUNTER — Ambulatory Visit: Payer: Medicare Other | Admitting: Internal Medicine

## 2023-04-28 ENCOUNTER — Encounter: Payer: Self-pay | Admitting: Internal Medicine

## 2023-04-28 VITALS — BP 154/77 | HR 79 | Temp 98.7°F | Resp 21 | Ht 65.0 in | Wt 154.0 lb

## 2023-04-28 DIAGNOSIS — Z800 Family history of malignant neoplasm of digestive organs: Secondary | ICD-10-CM

## 2023-04-28 DIAGNOSIS — Z1211 Encounter for screening for malignant neoplasm of colon: Secondary | ICD-10-CM

## 2023-04-28 DIAGNOSIS — D123 Benign neoplasm of transverse colon: Secondary | ICD-10-CM | POA: Diagnosis not present

## 2023-04-28 DIAGNOSIS — R197 Diarrhea, unspecified: Secondary | ICD-10-CM

## 2023-04-28 MED ORDER — SODIUM CHLORIDE 0.9 % IV SOLN: 500.0000 mL | INTRAVENOUS | Status: DC

## 2023-04-28 NOTE — Progress Notes (Signed)
Report to PACU, RN, vss, BBS= Clear.

## 2023-04-28 NOTE — Patient Instructions (Addendum)
Handouts Provided:  Polyps  RESUME Eliquis at prior dose tomorrow.  Please see follow up appointment that has been scheduled.  YOU HAD AN ENDOSCOPIC PROCEDURE TODAY AT THE [facility]:   Refer to the procedure report that was given to you for any specific questions about what was found during the examination.  If the procedure report does not answer your questions, please call your gastroenterologist to clarify.  If you requested that your care partner not be given the details of your procedure findings, then the procedure report has been included in a sealed envelope for you to review at your convenience later.  YOU SHOULD EXPECT: Some feelings of bloating in the abdomen. Passage of more gas than usual.  Walking can help get rid of the air that was put into your GI tract during the procedure and reduce the bloating. If you had a lower endoscopy (such as a colonoscopy or flexible sigmoidoscopy) you may notice spotting of blood in your stool or on the toilet paper. If you underwent a bowel prep for your procedure, you may not have a normal bowel movement for a few days.  Please Note:  You might notice some irritation and congestion in your nose or some drainage.  This is from the oxygen used during your procedure.  There is no need for concern and it should clear up in a day or so.  SYMPTOMS TO REPORT IMMEDIATELY:  Following lower endoscopy (colonoscopy or flexible sigmoidoscopy):  Excessive amounts of blood in the stool  Significant tenderness or worsening of abdominal pains  Swelling of the abdomen that is new, acute  Fever of 100F or higher  For urgent or emergent issues, a gastroenterologist can be reached at any hour by calling [phone]. Do not use MyChart messaging for urgent concerns.    DIET:  We do recommend a small meal at first, but then you may proceed to your regular diet.  Drink plenty of fluids but you should avoid alcoholic beverages for 24  hours.  ACTIVITY:  You should plan to take it easy for the rest of today and you should NOT DRIVE or use heavy machinery until tomorrow (because of the sedation medicines used during the test).    FOLLOW UP: Our staff will call the number listed on your records the next business day following your procedure.  We will call around 7:15- 8:00 am to check on you and address any questions or concerns that you may have regarding the information given to you following your procedure. If we do not reach you, we will leave a message.     If any biopsies were taken you will be contacted by phone or by letter within the next 1-3 weeks.  Please call us at [phone] if you have not heard about the biopsies in 3 weeks.    SIGNATURES/CONFIDENTIALITY: You and/or your care partner have signed paperwork which will be entered into your electronic medical record.  These signatures attest to the fact that that the information above on your After Visit Summary has been reviewed and is understood.  Full responsibility of the confidentiality of this discharge information lies with you and/or your care-partner.

## 2023-04-28 NOTE — Progress Notes (Signed)
Called to room to assist during endoscopic procedure.  Patient ID and intended procedure confirmed with present staff. Received instructions for my participation in the procedure from the performing physician.  

## 2023-04-28 NOTE — Progress Notes (Signed)
GASTROENTEROLOGY PROCEDURE H&P NOTE   Primary Care Physician: Alinda Deem, MD    Reason for Procedure:   Diarrhea, colon cancer screening, family history of colon cancer  Plan:    Colonoscopy  Patient is appropriate for endoscopic procedure(s) in the ambulatory ([facility]) setting.  The nature of the procedure, as well as the risks, benefits, and alternatives were carefully and thoroughly reviewed with the patient. Ample time for discussion and questions allowed. The patient understood, was satisfied, and agreed to proceed.     HPI: Stacy Contreras is a 82 y.o. female who presents for colonoscopy for evaluation of diarrhea, colon cancer screening, family history of colon cancer.  Patient was most recently seen in the Gastroenterology Clinic on 04/09/23.  No interval change in medical history since that appointment. Please refer to that note for full details regarding GI history and clinical presentation.   Past Medical History:  Diagnosis Date   Arthritis    Asthma    Coronary artery disease    Diabetes mellitus without complication (HCC)    DVT (deep venous thrombosis) (HCC)    GERD (gastroesophageal reflux disease)    History of breast cancer    Hypertension    Interstitial lung disease (HCC)    Kidney stones    Oxygen deficiency    Shortness of breath dyspnea     Past Surgical History:  Procedure Laterality Date   APPENDECTOMY     CARDIAC CATHETERIZATION     CHOLECYSTECTOMY     COLONOSCOPY     CORONARY ANGIOPLASTY     ESOPHAGOGASTRODUODENOSCOPY     EYE SURGERY     cataracts   hysterectomy     left knee replacement     LUNG BIOPSY Right 05/08/2016   Procedure: LUNG BIOPSY;  Surgeon: Loreli Slot, MD;  Location: [facility] OR;  Service: Thoracic;  Laterality: Right;   right breast lumpectomy     stents x 2     VIDEO ASSISTED THORACOSCOPY Right 05/08/2016   Procedure: VIDEO ASSISTED THORACOSCOPY;  Surgeon: Loreli Slot, MD;  Location: [facility] OR;  Service:  Thoracic;  Laterality: Right;   VIDEO BRONCHOSCOPY Bilateral 12/19/2015   Procedure: VIDEO BRONCHOSCOPY WITHOUT FLUORO;  Surgeon: Kalman Shan, MD;  Location: [facility];  Service: Endoscopy;  Laterality: Bilateral;   VIDEO BRONCHOSCOPY N/A 05/08/2016   Procedure: VIDEO BRONCHOSCOPY;  Surgeon: Loreli Slot, MD;  Location: [facility] OR;  Service: Thoracic;  Laterality: N/A;    Prior to Admission medications   Medication Sig Start Date End Date Taking? Authorizing Provider  acetaminophen (TYLENOL) 500 MG tablet Take 1,000 mg by mouth 2 (two) times daily as needed for headache (pain). Patient not taking: Reported on 04/09/2023    [provider]  apixaban (ELIQUIS) 5 MG TABS tablet Take 0.5 tablets (2.5 mg total) by mouth 2 (two) times daily. Hold on Eliquis the day prior to Bronchoscopy Patient taking differently: Take 2.5 mg by mouth 2 (two) times daily. 12/13/15   Zannie Cove, MD  Cholecalciferol (VITAMIN D3) 2000 units capsule Take 2,000 Units by mouth daily.    [provider]  dicyclomine (BENTYL) 10 MG capsule Take 1 capsule (10 mg total) by mouth 3 (three) times daily as needed for spasms. 04/09/23   Imogene Burn, MD  fluconazole (DIFLUCAN) 100 MG tablet Take 1 tablet (100 mg total) by mouth daily. 02/16/23   Imogene Burn, MD  fluticasone (FLONASE) 50 MCG/ACT nasal spray Place 1 spray into both nostrils daily as  needed for allergies or rhinitis.    [provider]  gabapentin (NEURONTIN) 300 MG capsule Take 300 mg by mouth 3 (three) times daily. Pt take at bedtime    [provider]  isosorbide mononitrate (IMDUR) 30 MG 24 hr tablet Take 0.5 tablets (15 mg total) by mouth at bedtime. 04/29/20 02/16/23  Darlin Drop, DO  Lifitegrast Benay Spice) 5 % SOLN Apply to eye. 06/29/18   [provider]  losartan (COZAAR) 100 MG tablet Take 100 mg by mouth at bedtime.    [provider]  metFORMIN (GLUCOPHAGE) 500 MG tablet Take 1,000 mg by  mouth 2 (two) times daily with a meal.    [provider]  metoprolol succinate (TOPROL-XL) 50 MG 24 hr tablet Take 50 mg by mouth daily.    [provider]  Na Sulfate-K Sulfate-Mg Sulf 17.5-3.13-1.6 GM/177ML SOLN Use as directed; may use generic; goodrx card if insurance will not cover generic 04/09/23   Imogene Burn, MD  nitroGLYCERIN (NITROSTAT) 0.4 MG SL tablet Place 0.4 mg under the tongue every 5 (five) minutes as needed for chest pain.    [provider]  Omega-3 Fatty Acids (FISH OIL PO) Take 1,400 mg by mouth daily.    [provider]  omeprazole (PRILOSEC) 40 MG capsule Take 1 capsule (40 mg total) by mouth daily before breakfast. 04/09/23   Imogene Burn, MD  ondansetron (ZOFRAN) 4 MG tablet Take 1 tablet (4 mg total) by mouth 3 (three) times daily as needed for nausea or vomiting. 02/08/23   Imogene Burn, MD  OXYGEN Inhale 2 L into the lungs as needed (shortness of breath/ low oxygen stats).    [provider]  PARoxetine (PAXIL) 20 MG tablet Take 10 mg by mouth 2 (two) times daily.     [provider]  REPATHA SURECLICK 140 MG/ML SOAJ Inject 1 mL into the skin every 14 (fourteen) days.    [provider]    Current Outpatient Medications  Medication Sig Dispense Refill   acetaminophen (TYLENOL) 500 MG tablet Take 1,000 mg by mouth 2 (two) times daily as needed for headache (pain). (Patient not taking: Reported on 04/09/2023)     apixaban (ELIQUIS) 5 MG TABS tablet Take 0.5 tablets (2.5 mg total) by mouth 2 (two) times daily. Hold on Eliquis the day prior to Bronchoscopy (Patient taking differently: Take 2.5 mg by mouth 2 (two) times daily.) 60 tablet    Cholecalciferol (VITAMIN D3) 2000 units capsule Take 2,000 Units by mouth daily.     dicyclomine (BENTYL) 10 MG capsule Take 1 capsule (10 mg total) by mouth 3 (three) times daily as needed for spasms. 270 capsule 1   fluconazole (DIFLUCAN) 100 MG tablet Take 1 tablet (100  mg total) by mouth daily. 30 tablet 0   fluticasone (FLONASE) 50 MCG/ACT nasal spray Place 1 spray into both nostrils daily as needed for allergies or rhinitis.     gabapentin (NEURONTIN) 300 MG capsule Take 300 mg by mouth 3 (three) times daily. Pt take at bedtime     isosorbide mononitrate (IMDUR) 30 MG 24 hr tablet Take 0.5 tablets (15 mg total) by mouth at bedtime. 15 tablet 0   Lifitegrast (XIIDRA) 5 % SOLN Apply to eye.     losartan (COZAAR) 100 MG tablet Take 100 mg by mouth at bedtime.     metFORMIN (GLUCOPHAGE) 500 MG tablet Take 1,000 mg by mouth 2 (two) times daily with a meal.  metoprolol succinate (TOPROL-XL) 50 MG 24 hr tablet Take 50 mg by mouth daily.     Na Sulfate-K Sulfate-Mg Sulf 17.5-3.13-1.6 GM/177ML SOLN Use as directed; may use generic; goodrx card if insurance will not cover generic 354 mL 0   nitroGLYCERIN (NITROSTAT) 0.4 MG SL tablet Place 0.4 mg under the tongue every 5 (five) minutes as needed for chest pain.     Omega-3 Fatty Acids (FISH OIL PO) Take 1,400 mg by mouth daily.     omeprazole (PRILOSEC) 40 MG capsule Take 1 capsule (40 mg total) by mouth daily before breakfast. 30 capsule 3   ondansetron (ZOFRAN) 4 MG tablet Take 1 tablet (4 mg total) by mouth 3 (three) times daily as needed for nausea or vomiting. 30 tablet 1   OXYGEN Inhale 2 L into the lungs as needed (shortness of breath/ low oxygen stats).     PARoxetine (PAXIL) 20 MG tablet Take 10 mg by mouth 2 (two) times daily.      REPATHA SURECLICK 140 MG/ML SOAJ Inject 1 mL into the skin every 14 (fourteen) days.     No current facility-administered medications for this visit.    Allergies as of 04/28/2023 - Review Complete 04/28/2023  Allergen Reaction Noted   Codeine Other (See Comments), Palpitations, and Shortness Of Breath 12/11/2015   Statins Other (See Comments) 12/11/2015   Sulfa antibiotics Rash 12/11/2015    Family History  Problem Relation Age of Onset   Lung disease Mother        ?  brown lung   Diabetes Mother    Colon cancer Father    Breast cancer Sister    Colon cancer Paternal Grandfather    Breast cancer Daughter    Liver disease Neg Hx    Esophageal cancer Neg Hx     Social History   Socioeconomic History   Marital status: Married    Spouse name: Not on file   Number of children: 4   Years of education: Not on file   Highest education level: Not on file  Occupational History   Occupation: Realtor  Tobacco Use   Smoking status: Never   Smokeless tobacco: Never  Vaping Use   Vaping status: Never Used  Substance and Sexual Activity   Alcohol use: Yes    Alcohol/week: 0.0 standard drinks of alcohol    Comment: occasional   Drug use: No   Sexual activity: Not on file  Other Topics Concern   Not on file  Social History Narrative   Not on file   Social Determinants of Health   Financial Resource Strain: Not on file  Food Insecurity: Not on file  Transportation Needs: Not on file  Physical Activity: Not on file  Stress: Not on file  Social Connections: Not on file  Intimate Partner Violence: Not on file    Physical Exam: Vital signs in last 24 hours: There were no vitals taken for this visit. GEN: NAD EYE: Sclerae anicteric ENT: MMM CV: Non-tachycardic Pulm: No increased WOB GI: Soft NEURO:  Alert & Oriented   Eulah Pont, MD  Gastroenterology   04/28/2023 3:08 PM

## 2023-04-28 NOTE — Op Note (Signed)
[facility] Patient Name: Stacy Contreras Procedure Date: 04/28/2023 3:50 PM MRN: [id number] Endoscopist: Particia Lather , , [id number] Age: 82 Referring MD:  Date of Birth: April 27, 1941 Gender: Female Account #: [id number] Procedure:                Colonoscopy Indications:              Screening in patient at increased risk: Family                            history of 1st-degree relative with colorectal                            cancer before age 86 years, Incidental - Diarrhea Medicines:                Monitored Anesthesia Care Procedure:                Pre-Anesthesia Assessment:                           - Prior to the procedure, a History and Physical                            was performed, and patient medications and                            allergies were reviewed. The patient's tolerance of                            previous anesthesia was also reviewed. The risks                            and benefits of the procedure and the sedation                            options and risks were discussed with the patient.                            All questions were answered, and informed consent                            was obtained. Prior Anticoagulants: The patient has                            taken Eliquis (apixaban), last dose was 3 days                            prior to procedure. ASA Grade Assessment: III - A                            patient with severe systemic disease. After                            reviewing the risks and benefits, the patient was  deemed in satisfactory condition to undergo the                            procedure.                           After obtaining informed consent, the colonoscope                            was passed under direct vision. Throughout the                            procedure, the patient's blood pressure, pulse, and                            oxygen saturations were monitored  continuously. The                            CF [id number] #[id number] was introduced through the anus                            and advanced to the the terminal ileum. The                            colonoscopy was performed without difficulty. The                            patient tolerated the procedure well. The quality                            of the bowel preparation was good. The terminal                            ileum, ileocecal valve, appendiceal orifice, and                            rectum were photographed. Scope In: 3:58:00 PM Scope Out: 4:20:08 PM Scope Withdrawal Time: 0 hours 15 minutes 49 seconds  Total Procedure Duration: 0 hours 22 minutes 8 seconds  Findings:                 The terminal ileum appeared normal.                           A 2 mm polyp was found in the transverse colon. The                            polyp was sessile. The polyp was removed with a                            cold biopsy forceps. Resection and retrieval were                            complete.  A 7 mm polyp was found in the transverse colon. The                            polyp was sessile. The polyp was removed with a                            cold snare. Resection and retrieval were complete.                           Non-bleeding internal hemorrhoids were found during                            retroflexion.                           Biopsies for histology were taken with a cold                            forceps from the entire colon for evaluation of                            microscopic colitis. Complications:            No immediate complications. Estimated Blood Loss:     Estimated blood loss was minimal. Impression:               - The examined portion of the ileum was normal.                           - One 2 mm polyp in the transverse colon, removed                            with a cold biopsy forceps. Resected and retrieved.                            - One 7 mm polyp in the transverse colon, removed                            with a cold snare. Resected and retrieved.                           - Non-bleeding internal hemorrhoids.                           - Biopsies were taken with a cold forceps from the                            entire colon for evaluation of microscopic colitis. Recommendation:           - Discharge patient to home (with escort).                           - Await pathology results.                           -  Restart Eliquis tomorrow.                           - Return to GI clinic in 2-3 months.                           - The findings and recommendations were discussed                            with the patient. Dr Particia Lather "Alan Ripper" Leonides Schanz,  04/28/2023 4:27:29 PM

## 2023-04-29 ENCOUNTER — Telehealth: Payer: Self-pay

## 2023-04-29 NOTE — Telephone Encounter (Signed)
  Follow up Call-     04/28/2023    3:11 PM 02/16/2023    9:55 AM  Call back number  Post procedure Call Back phone  # [phone]  Permission to leave phone message Yes Yes    Post op call attempted, no answer, left WM.

## 2023-05-06 LAB — SURGICAL PATHOLOGY

## 2023-05-07 ENCOUNTER — Encounter: Payer: Self-pay | Admitting: Internal Medicine

## 2023-05-10 ENCOUNTER — Encounter: Payer: Self-pay | Admitting: Internal Medicine

## 2023-05-10 ENCOUNTER — Telehealth: Payer: Self-pay | Admitting: Internal Medicine

## 2023-05-10 ENCOUNTER — Other Ambulatory Visit: Payer: Self-pay

## 2023-05-10 MED ORDER — COLESTIPOL HCL 1 G PO TABS: 2.0000 g | ORAL_TABLET | Freq: Every day | ORAL | 3 refills | Status: DC

## 2023-05-10 NOTE — Telephone Encounter (Signed)
Patient returning call in regards to MyChart message. Please advise.   Thank you

## 2023-05-10 NOTE — Telephone Encounter (Signed)
See MyChart message for response

## 2023-05-12 ENCOUNTER — Encounter: Payer: Self-pay | Admitting: Internal Medicine

## 2023-05-13 ENCOUNTER — Encounter: Payer: Self-pay | Admitting: Internal Medicine

## 2023-05-13 NOTE — Progress Notes (Signed)
Received results of [facility] GI pathogen panel 04/12/23 that was negative. Will have report scanned into the system.

## 2023-06-04 ENCOUNTER — Encounter: Payer: Self-pay | Admitting: Internal Medicine

## 2023-08-02 ENCOUNTER — Other Ambulatory Visit: Payer: Self-pay | Admitting: Internal Medicine

## 2023-08-12 ENCOUNTER — Ambulatory Visit: Payer: Medicare Other | Admitting: Internal Medicine

## 2023-12-03 ENCOUNTER — Encounter: Payer: Self-pay | Admitting: Cardiology

## 2023-12-03 ENCOUNTER — Ambulatory Visit: Payer: Medicare Other | Attending: Cardiology | Admitting: Cardiology

## 2023-12-03 VITALS — BP 130/70 | HR 58 | Ht 65.0 in | Wt 159.2 lb

## 2023-12-03 DIAGNOSIS — T466X5A Adverse effect of antihyperlipidemic and antiarteriosclerotic drugs, initial encounter: Secondary | ICD-10-CM | POA: Insufficient documentation

## 2023-12-03 DIAGNOSIS — M791 Myalgia, unspecified site: Secondary | ICD-10-CM | POA: Insufficient documentation

## 2023-12-03 DIAGNOSIS — I82409 Acute embolism and thrombosis of unspecified deep veins of unspecified lower extremity: Secondary | ICD-10-CM | POA: Insufficient documentation

## 2023-12-03 DIAGNOSIS — T466X5D Adverse effect of antihyperlipidemic and antiarteriosclerotic drugs, subsequent encounter: Secondary | ICD-10-CM

## 2023-12-03 DIAGNOSIS — E782 Mixed hyperlipidemia: Secondary | ICD-10-CM | POA: Insufficient documentation

## 2023-12-03 DIAGNOSIS — I10 Essential (primary) hypertension: Secondary | ICD-10-CM | POA: Diagnosis present

## 2023-12-03 DIAGNOSIS — I25119 Atherosclerotic heart disease of native coronary artery with unspecified angina pectoris: Secondary | ICD-10-CM | POA: Diagnosis present

## 2023-12-03 MED ORDER — NITROGLYCERIN 0.4 MG SL SUBL: 0.4000 mg | SUBLINGUAL_TABLET | SUBLINGUAL | 3 refills | Status: AC | PRN

## 2023-12-03 MED ORDER — METOPROLOL SUCCINATE ER 100 MG PO TB24: 100.0000 mg | ORAL_TABLET | Freq: Every day | ORAL | 3 refills | Status: DC

## 2023-12-03 NOTE — Patient Instructions (Addendum)
 Medication Instructions:  Your physician has recommended you make the following change in your medication:  Stop atenolol  Increase metoprolol succinate to 100 mg daily Continue all other medications as prescribed  Labwork: none  Testing/Procedures: none  Follow-Up: Your physician recommends that you schedule a follow-up appointment in: 1 year. You will receive a reminder call in about 8-10 months reminding you to schedule your appointment. If you don't receive this call, please contact our office.  Any Other Special Instructions Will Be Listed Below (If Applicable).  If you need a refill on your cardiac medications before your next appointment, please call your pharmacy.

## 2023-12-03 NOTE — Progress Notes (Signed)
 Cardiology Office Note  Date: 12/03/2023   ID: Stacy, Contreras [birth year], MRN [id number]  PCP: Linford Ribas, MD  Chief Complaint:  Chief Complaint  Patient presents with   Establish cardiology follow-up   History of Present Illness: Stacy Contreras is an 83 y.o. female presenting to establish cardiology follow-up, previously followed at [facility].  I reviewed her records and updated the chart.  Last cardiology visit at [facility] was in May 2024 with Dr. Alta Jersey, I reviewed the note.  She is here today with her husband.  She describes only rare episodes of angina and nitroglycerin  use within a year, no recurring exertional symptomatology.  No palpitations or recurrent syncope.  We went over her medications in detail.  She has been taking both atenolol  25 mg twice daily and Toprol -XL 50 mg daily for some time now.  We discussed stopping atenolol  and increasing her Toprol -XL dose to simplify things.  She does need a refill for nitroglycerin .  Eliquis  dose is 2.5 mg twice daily, per guidelines reasonable for chronic venous thromboembolic risk reduction.  She has not had any recurrent events.  She has tolerated Repatha well, history of statin myalgias.  Her LDL was 42 last year.  Note, she is back on Ambien  although at 5 mg daily, and has not had any recurrent nocturnal syncope.  I reviewed her ECG today which shows sinus bradycardia with nonspecific T wave changes.  Review of Systems: As outlined in the history of present illness.  No leg swelling or pain.  Past Medical History: Past Medical History:  Diagnosis Date   Breast cancer (HCC)    Status post XRT   Coronary artery disease    BMS (Multi-link Vision Cobalt Chromium) to LAD 10/2012 and DES to mid LAD 12/2012   COVID-19 2021   GERD (gastroesophageal reflux disease)    History of breast cancer    Interstitial lung disease (HCC)    Associated with connective tissue disease   Mixed hyperlipidemia    Primary hypertension    Pulmonary  embolus (HCC)    Also history of DVT x 2 on chronic anticoagulation   Sjogren's syndrome (HCC)    Statin intolerance    Syncope    Associated with Ambien    Systemic lupus erythematosus (HCC)    Past Surgical History: Past Surgical History:  Procedure Laterality Date   ABDOMINAL HYSTERECTOMY     APPENDECTOMY     BREAST LUMPECTOMY Right    CARDIAC CATHETERIZATION     CHOLECYSTECTOMY     COLONOSCOPY     CORONARY ANGIOPLASTY     ESOPHAGOGASTRODUODENOSCOPY     EYE SURGERY     cataracts   LUNG BIOPSY Right 05/08/2016   Procedure: LUNG BIOPSY;  Surgeon: Zelphia Higashi, MD;  Location: [facility] OR;  Service: Thoracic;  Laterality: Right;   REPLACEMENT TOTAL KNEE Left    VIDEO ASSISTED THORACOSCOPY Right 05/08/2016   Procedure: VIDEO ASSISTED THORACOSCOPY;  Surgeon: Zelphia Higashi, MD;  Location[facility] OR;  Service: Thoracic;  Laterality: Right;   VIDEO BRONCHOSCOPY Bilateral 12/19/2015   Procedure: VIDEO BRONCHOSCOPY WITHOUT FLUORO;  Surgeon: Maire Scot, MD;  Location: [facility];  Service: Endoscopy;  Laterality: Bilateral;   VIDEO BRONCHOSCOPY N/A 05/08/2016   Procedure: VIDEO BRONCHOSCOPY;  Surgeon: Zelphia Higashi, MD;  Location: [facility] OR;  Service: Thoracic;  Laterality: N/A;   Family History: Family History  Problem Relation Age of Onset   Stroke Mother    Diabetes Mother    Colon  cancer Father    Breast cancer Sister    Colon cancer Paternal Grandfather    Breast cancer Daughter    Liver disease Neg Hx    Esophageal cancer Neg Hx    Social History:  Social History   Tobacco Use   Smoking status: Never   Smokeless tobacco: Never  Substance Use Topics   Alcohol use: Yes    Comment: Occasional   Medications: Current Outpatient Medications on File Prior to Visit  Medication Sig Dispense Refill   acetaminophen  (TYLENOL ) 500 MG tablet Take 1,000 mg by mouth 2 (two) times daily as needed for headache (pain).     albuterol  (VENTOLIN  HFA) 108 (90 Base)  MCG/ACT inhaler Inhale 2 puffs into the lungs as needed for wheezing or shortness of breath.     amLODipine  (NORVASC ) 5 MG tablet Take 5 mg by mouth daily.     apixaban  (ELIQUIS ) 2.5 MG TABS tablet Take 2.5 mg by mouth 2 (two) times daily.     atenolol  (TENORMIN ) 25 MG tablet Take 25 mg by mouth 2 (two) times daily.     Cholecalciferol  (VITAMIN D3) 2000 units capsule Take 2,000 Units by mouth daily.     dicyclomine  (BENTYL ) 10 MG capsule Take 1 capsule (10 mg total) by mouth 3 (three) times daily as needed for spasms. 270 capsule 1   fluticasone  (FLONASE ) 50 MCG/ACT nasal spray Place 1 spray into both nostrils daily as needed for allergies or rhinitis.     gabapentin  (NEURONTIN ) 300 MG capsule Take 900 mg by mouth every evening. Pt take at bedtime     isosorbide  mononitrate (IMDUR ) 30 MG 24 hr tablet Take 30 mg by mouth daily.     losartan  (COZAAR ) 100 MG tablet Take 100 mg by mouth at bedtime.     metFORMIN  (GLUCOPHAGE ) 500 MG tablet Take 500 mg by mouth 2 (two) times daily with a meal.     metoprolol succinate (TOPROL-XL) 50 MG 24 hr tablet Take 50 mg by mouth daily.     nitroGLYCERIN (NITROSTAT) 0.4 MG SL tablet Place 0.4 mg under the tongue every 5 (five) minutes as needed for chest pain.     Omega-3 Fatty Acids (FISH OIL PO) Take 1,400 mg by mouth daily.     omeprazole  (PRILOSEC) 20 MG capsule Take 20 mg by mouth daily.     ondansetron  (ZOFRAN ) 4 MG tablet Take 1 tablet (4 mg total) by mouth 3 (three) times daily as needed for nausea or vomiting. 30 tablet 1   OXYGEN Inhale 2 L into the lungs as needed (shortness of breath/ low oxygen stats).     PARoxetine  (PAXIL ) 20 MG tablet Take 10 mg by mouth daily.     REPATHA SURECLICK 140 MG/ML SOAJ Inject 1 mL into the skin every 14 (fourteen) days.     TURMERIC PO Take 1 tablet by mouth daily.     zolpidem  (AMBIEN ) 10 MG tablet Take 5 mg by mouth at bedtime as needed for sleep.     No current facility-administered medications on file prior to  visit.   Allergies: Allergies  Allergen Reactions   Codeine Other (See Comments), Palpitations and Shortness Of Breath    Fast heart beat (tolerates hydrocodone )  Fast heart beat (tolerates hydrocodone ), Fast heart beat (tolerates hydrocodone )   Statins Other (See Comments)    Muscle weakness   Sulfa  Antibiotics Rash    Takes Bactrim    Physical Exam: VS:  BP 130/70   Pulse (!) 58  Ht 5\' 5"  (1.651 m)   Wt 159 lb 3.2 oz (72.2 kg)   SpO2 97%   BMI 26.49 kg/m , BMI Body mass index is 26.49 kg/m.  Wt Readings from Last 3 Encounters:  12/03/23 159 lb 3.2 oz (72.2 kg)  04/28/23 154 lb (69.9 kg)  04/09/23 154 lb (69.9 kg)    General: Patient appears comfortable at rest. HEENT: Conjunctiva and lids normal. Neck: Supple, no elevated JVP or carotid bruits. Lungs: Clear to auscultation, nonlabored breathing at rest. Cardiac: Regular rate and rhythm, no S3 or significant systolic murmur, no pericardial rub. Abdomen: Soft, nontender, bowel sounds present. Extremities: No pitting edema, distal pulses 2+. Skin: Warm and dry. Musculoskeletal: No kyphosis. Neuropsychiatric: Alert and oriented x3, affect grossly appropriate.  ECG:  An ECG dated 04/23/2020 was personally reviewed today and demonstrated:  Sinus rhythm with counterclockwise rotation, nonspecific ST changes.  Labwork:  May 2024: Cholesterol 107, triglycerides 145, HDL 48, LDL 42, TSH 0.99 September 2024: Potassium 3.8, BUN 5, creatinine 0.9, GFR 65, AST 27, ALT 02 November 2023: Hemoglobin 12.3, platelets 216, creatinine 0.8, GFR 74  Other Studies Reviewed Today:  Lexiscan cardiac MRI 02/04/2022 ([facility]): 1. The left ventricle is lower limits of normal in cavity size.  There is basal sigmoid septum.  Regional  and global LV systolic function are normal to hyperdynamic.  The LVEF is calculated at >70%.   2. The right ventricle is normal in cavity size, wall thickness, and systolic function.   3. The left atrium is mildly  enlarged.The right atrium is normal in size.   4. The aortic valve is trileaflet in morphology. There is no significant aortic stenosis.  There is mild  aortic regurgitation.   5.  There is trivial mitral regurgitation, trivial tricuspid regurgitation, and trivial pulmonic  regurgitation.   6. Delayed enhancement imaging demonstrates no evidence of myocardial infarction, scarring, or  infiltration.   7. Regadenoson stress perfusion imaging demonstrates no evidence of inducible myocardial ischemia.    8. There is no evidence of an intracardiac thrombus.   9. The pericardium is normal in thickness.  There is no pericardial effusion.   Assessment and Plan:  1.  CAD status post BMS to LAD in March 2014 followed by DES to mid LAD in May 2014.  Lexiscan cardiac MRI in June 2023 at [facility] indicated no ischemia with LVEF greater than 70%.  She reports rare angina and nitroglycerin use over the span of a year, ECG reviewed and stable.  Plan to continue medical therapy and observation at this time.  She is not on aspirin  given concurrent use of Eliquis .  Plan to refill fresh bottle of nitroglycerin.  Continue Imdur  30 mg daily and Repatha 140 mg/mL every 14 days.  2.  History of syncope in association with Ambien  use.  Cardiac monitor and ischemic workup in 2023 reassuring with no associated arrhythmia, normal LVEF, and no ischemia.  Per discussion today she was having symptoms on Ambien  10 mg in the evening, has since started back on 5 mg in the evening and is tolerating it well.  3.  Mixed hyperlipidemia with statin intolerance.  LDL 42 and HDL 48 in May 2024.  She continues on Repatha 140 mg/mL every 14 days.  4.  Primary hypertension.  Blood pressure reasonable today.  I am having her stop atenolol  and we will increase Toprol-XL to 100 mg daily.  Continue Norvasc  5 mg daily and Cozaar  100 mg daily.  Keep follow-up with PCP.  5.  History of recurrent DVT with pulmonary embolus on chronic  anticoagulation.  She is on Eliquis  2.5 mg twice daily which by guidelines is reasonable for chronic suppression.  Disposition:  Follow up  1 year.  Signed, Gerard Knight, M.D., F.A.C.C. [facility]

## 2024-01-27 ENCOUNTER — Other Ambulatory Visit: Payer: Self-pay | Admitting: Internal Medicine

## 2024-02-22 ENCOUNTER — Other Ambulatory Visit: Payer: Self-pay

## 2024-02-22 ENCOUNTER — Telehealth: Payer: Self-pay | Admitting: Cardiology

## 2024-02-22 MED ORDER — APIXABAN 2.5 MG PO TABS: 2.5000 mg | ORAL_TABLET | Freq: Two times a day (BID) | ORAL | 1 refills | Status: DC

## 2024-02-22 NOTE — Telephone Encounter (Signed)
 Prescription refill request for Eliquis  received. Indication:dvt Last office visit:4/25 Scr:0.8  3/25 Age: 83 Weight:72.2  kg  Prescription refilled

## 2024-02-22 NOTE — Telephone Encounter (Signed)
*  STAT* If patient is at the pharmacy, call can be transferred to refill team.   1. Which medications need to be refilled? (please list name of each medication and dose if known)  apixaban  (ELIQUIS ) 2.5 MG TABS tablet   2. Which pharmacy/location (including street and city if local pharmacy) is medication to be sent to? [facility]   3. Do they need a 30 day or 90 day supply?  90 day supply  Patient says she is completely out of medication.

## 2024-04-04 ENCOUNTER — Other Ambulatory Visit: Payer: Self-pay

## 2024-04-04 ENCOUNTER — Telehealth: Payer: Self-pay | Admitting: Nurse Practitioner

## 2024-04-04 ENCOUNTER — Emergency Department (HOSPITAL_COMMUNITY)
Admission: EM | Admit: 2024-04-04 | Discharge: 2024-04-04 | Disposition: A | Discharge status: Home / Self Care | Attending: Emergency Medicine | Admitting: Emergency Medicine

## 2024-04-04 ENCOUNTER — Encounter (HOSPITAL_COMMUNITY): Payer: Self-pay

## 2024-04-04 ENCOUNTER — Telehealth: Payer: Self-pay | Admitting: Cardiology

## 2024-04-04 DIAGNOSIS — I10 Essential (primary) hypertension: Secondary | ICD-10-CM | POA: Diagnosis not present

## 2024-04-04 DIAGNOSIS — Z8616 Personal history of COVID-19: Secondary | ICD-10-CM | POA: Insufficient documentation

## 2024-04-04 DIAGNOSIS — I2510 Atherosclerotic heart disease of native coronary artery without angina pectoris: Secondary | ICD-10-CM | POA: Diagnosis not present

## 2024-04-04 DIAGNOSIS — Z853 Personal history of malignant neoplasm of breast: Secondary | ICD-10-CM | POA: Diagnosis not present

## 2024-04-04 DIAGNOSIS — K625 Hemorrhage of anus and rectum: Secondary | ICD-10-CM | POA: Diagnosis present

## 2024-04-04 LAB — COMPREHENSIVE METABOLIC PANEL WITH GFR
ALT: 15 U/L (ref 0–44)
AST: 18 U/L (ref 15–41)
Albumin: 4.3 g/dL (ref 3.5–5.0)
Alkaline Phosphatase: 46 U/L (ref 38–126)
Anion gap: 16 — ABNORMAL HIGH (ref 5–15)
BUN: 7 mg/dL — ABNORMAL LOW (ref 8–23)
CO2: 21 mmol/L — ABNORMAL LOW (ref 22–32)
Calcium: 9.6 mg/dL (ref 8.9–10.3)
Chloride: 98 mmol/L (ref 98–111)
Creatinine, Ser: 0.92 mg/dL (ref 0.44–1.00)
GFR, Estimated: >60 mL/min (ref >60)
Glucose, Bld: 111 mg/dL — ABNORMAL HIGH (ref 70–99)
Potassium: 4.9 mmol/L (ref 3.5–5.1)
Sodium: 134 mmol/L — ABNORMAL LOW (ref 135–145)
Total Bilirubin: 0.5 mg/dL (ref 0.0–1.2)
Total Protein: 6.4 g/dL — ABNORMAL LOW (ref 6.5–8.1)

## 2024-04-04 LAB — CBC
HCT: 38.5 % (ref 36.0–46.0)
Hemoglobin: 12.6 g/dL (ref 12.0–15.0)
MCH: 30 pg (ref 26.0–34.0)
MCHC: 32.7 g/dL (ref 30.0–36.0)
MCV: 91.7 fL (ref 80.0–100.0)
Platelets: 204 K/uL (ref 150–400)
RBC: 4.2 MIL/uL (ref 3.87–5.11)
RDW: 13 % (ref 11.5–15.5)
WBC: 4.8 K/uL (ref 4.0–10.5)
nRBC: 0 % (ref 0.0–0.2)

## 2024-04-04 LAB — TYPE AND SCREEN
ABO/RH(D): A POS
Antibody Screen: NEGATIVE

## 2024-04-04 LAB — POC OCCULT BLOOD, ED: Fecal Occult Bld: NEGATIVE

## 2024-04-04 MED ORDER — ISOSORBIDE MONONITRATE ER 30 MG PO TB24: 30.0000 mg | ORAL_TABLET | Freq: Every day | ORAL | 2 refills | Status: AC

## 2024-04-04 NOTE — ED Provider Notes (Signed)
 Emergency Department Provider Note   I have reviewed the triage vital signs and the nursing notes.   HISTORY  Chief Complaint Rectal Bleeding   HPI Stacy Contreras is a 83 y.o. female with history of remote history of DVT, on Eliquis , presents to the ED with intermittent black, tarry stools. Symptoms present for the last 3-4 weeks. He feels the urge to have a BM and then suddenly has black/sticky stools. No BRBPR. No fever. No abdominal pain. Has followed with [facility] in the past. She had additional symptoms today which prompted her ED evaluation today.    Past Medical History:  Diagnosis Date   Breast cancer [facility])    Status post XRT   Coronary artery disease    BMS (Multi-link Vision Cobalt Chromium) to LAD 10/2012 and DES to mid LAD 12/2012   COVID-19 2021   GERD (gastroesophageal reflux disease)    History of breast cancer    Interstitial lung disease (HCC)    Associated with connective tissue disease   Mixed hyperlipidemia    Primary hypertension    Pulmonary embolus (HCC)    Also history of DVT x 2 on chronic anticoagulation   Sjogren's syndrome (HCC)    Statin intolerance    Syncope    Associated with Ambien    Systemic lupus erythematosus (HCC)     Review of Systems  Constitutional: No fever/chills Cardiovascular: Denies chest pain. Respiratory: Denies shortness of breath. Gastrointestinal: No abdominal pain.  No nausea, no vomiting.  Positive black, sticky stools.  Skin: Negative for rash. Neurological: Negative for headaches, focal weakness or numbness.  ____________________________________________   PHYSICAL EXAM:  VITAL SIGNS: ED Triage Vitals  Encounter Vitals Group     BP 04/04/24 1307 (!) 141/54     Pulse Rate 04/04/24 1307 (!) 55     Resp 04/04/24 1307 16     Temp 04/04/24 1307 97.6 F (36.4 C)     Temp Source 04/04/24 1307 Oral     SpO2 04/04/24 1307 96 %     Weight 04/04/24 1313 160 lb (72.6 kg)     Height 04/04/24 1313 5' 5 (1.651 m)    Constitutional: Alert and oriented. Well appearing and in no acute distress. Eyes: Conjunctivae are normal.  Head: Atraumatic. Nose: No congestion/rhinnorhea. Mouth/Throat: Mucous membranes are moist.  Neck: No stridor.  Cardiovascular: Normal rate, regular rhythm. Good peripheral circulation. Grossly normal heart sounds.   Respiratory: Normal respiratory effort.  No retractions. Lungs CTAB. Gastrointestinal: Soft and nontender. No distention.  Rectal exam patient's verbal consent and nurse chaperone present. No BRB or melena. Hemoccult negative.  Musculoskeletal: No gross deformities of extremities. Neurologic:  Normal speech and language.  Skin:  Skin is warm, dry and intact. No rash noted.  ____________________________________________   LABS (all labs ordered are listed, but only abnormal results are displayed)  Labs Reviewed  COMPREHENSIVE METABOLIC PANEL WITH GFR - Abnormal; Notable for the following components:      Result Value   Sodium 134 (*)    CO2 21 (*)    Glucose, Bld 111 (*)    BUN 7 (*)    Total Protein 6.4 (*)    Anion gap 16 (*)    All other components within normal limits  CBC  POC OCCULT BLOOD, ED  TYPE AND SCREEN   ____________________________________________   PROCEDURES  Procedure(s) performed:   Procedures  None  ____________________________________________   INITIAL IMPRESSION / ASSESSMENT AND PLAN / ED COURSE  Pertinent  labs & imaging results that were available during my care of the patient were reviewed by me and considered in my medical decision making (see chart for details).   This patient is Presenting for Evaluation of rectal bleeding, which does require a range of treatment options, and is a complaint that involves a high risk of morbidity and mortality.  The Differential Diagnoses include UGIB, medication mimic, LGIB, hemorrhoids, etc.  Clinical Laboratory Tests Ordered, included CBC with unchanged Hb and hemoccult negative.    Cardiac Monitor Tracing which shows NSR.    Social Determinants of Health Risk patient is a non-smoker.   Consult complete with [facility] Greig Spearman who discussed case with Dr. Federico. Reviewed labs and case by phone. They can arrange outpatient follow up rather than admit. Increased PPI to BID dosing and d/c Pepto.   Medical Decision Making: Summary:  Patient presents to the ED with black, tarry stools intermittently for the last several weeks. Some symptoms today. Hb from triage stable. Normal rectal exam and hemoccult negative.   Reevaluation with update and discussion with patient. Discussed GI recommendation and close follow up along with ED return precautions.   Considered admission but coordinated with GI and able to ensure close follow up. Stable for discharge.   Patient's presentation is most consistent with acute presentation with potential threat to life or bodily function.   Disposition: discharge  ____________________________________________  FINAL CLINICAL IMPRESSION(S) / ED DIAGNOSES  Final diagnoses:  Rectal bleeding    Note:  This document was prepared using Dragon voice recognition software and may include unintentional dictation errors.  Fonda Law, MD, [facility]    Athel Merriweather, Fonda MATSU, MD 04/04/24 2219

## 2024-04-04 NOTE — Telephone Encounter (Signed)
 I received a call for Dr. Fonda Law at [facility] ED, patient presented with 2 week history of on and off black tarry stools. On Eliquis  for past DVT/PE. Normal Hg 12.6. Rectal exam per Dr. Law showed a scant amount of brown stool, guaiac negative. Patient intermittently takes Pepto bismol for diarrhea. She is on Omeprazole  20mg  every day. Patient hemodynamically stable per Dr. Law. I communicated with Dr. Legrand who recommended expedited outpatient GI work up within the next week. I recommended PPI bid and no Pepto bismol. Patient to return to the ED if she passes frequent loose black tarry stool and/or if she develops profound fatigue, chest pain, dizziness or shortness of breath.  [facility] Nurses, pls contact patient and schedule her for an expedited office visit with Dr. Federico or [facility] APP.   Dr. Dorsey FYI

## 2024-04-04 NOTE — ED Triage Notes (Signed)
 Pt reports black tarry stools for a few weeks on and off, reports 4-5 stools a day and sometimes they are normal. Sometimes they are black. Pt takes eliquis . Denies pain.

## 2024-04-04 NOTE — Discharge Instructions (Signed)
 Your lab work appears normal.  Please increase your omeprazole  to twice daily dosing.  Please avoid Pepto-Bismol.  I spoke with your GI doctor on the phone and they will arrange close follow-up in the clinic.  Please call their office to coordinate as well.  Return with any new or suddenly worsening symptoms.

## 2024-04-04 NOTE — Telephone Encounter (Signed)
*  STAT* If patient is at the pharmacy, call can be transferred to refill team.   1. Which medications need to be refilled? (please list name of each medication and dose if known)   isosorbide  mononitrate (IMDUR ) 30 MG 24 hr tablet    4. Which pharmacy/location (including street and city if local pharmacy) is medication to be sent to?  [id number] [place], [place] - [address] Phone: [phone]  Fax: [phone]       5. Do they need a 30 day or 90 day supply? 90

## 2024-04-04 NOTE — Telephone Encounter (Signed)
 Pt's medication was sent to pt's pharmacy as requested. Confirmation received.

## 2024-04-05 NOTE — Telephone Encounter (Signed)
 Left message for pt to call back

## 2024-04-06 NOTE — Telephone Encounter (Signed)
 Scheduled OV with patient for 7 day hold on 9/5 at 1:30 pm with [place], [place]. Will confirm appointment tomorrow when it opens up.

## 2024-04-07 NOTE — Telephone Encounter (Signed)
 OV scheduled

## 2024-04-13 NOTE — Progress Notes (Unsigned)
 04/14/2024 Stacy Contreras [id number] 1940/12/13  Referring provider: Debera Jayson MATSU, MD Primary GI doctor: Stacy Contreras  ASSESSMENT AND PLAN:  Melenic stools intermittent associated with loose stools CT AP 03/2023 unremarkable, small HH 02/16/2023 EGD retained fluid gastric body, negative H pylori gastritis, negative fungal organisms, negative celiac 04/04/2024  HGB 12.6 MCV 91.7 Platelets 204 FOBT negative in the ER - hemoccult cards, check iron/ferritin, if this is abnormal will get VCE to evaluate for small bowel etiology - GES, gastroparesis diet given  IBS-D Loose x 2 years, has BM daily, up to 2-8 a day S/p cholecystectomy, trial of colestipol  that did not help Celiac negative CT AB and pelvis 03/2023 normal pancrease 04/2023 colonoscopy negative microscopic colitis - consider trial of questran  for bile reflux and for bile diarrhea -Imodium does help, continue for this time - check fecal fat and pancreatic elastase with greasy stools - consider TCA trial  - FODMAP given  Personal history of TA polyps 04/2023 colonoscopy negative microscopic colitis, 2 Tapolyps, no recall due to age  Fatty liver  Seen on CT AB and pelvis 03/2023, no LFT elevated    Latest Ref Rng & Units 04/04/2024    1:36 PM 02/05/2023   11:19 AM 04/27/2020    5:26 PM  Hepatic Function  Total Protein 6.5 - 8.1 g/dL 6.4  6.8  7.3   Albumin 3.5 - 5.0 g/dL 4.3  4.4  3.7   AST 15 - 41 U/L 18  22  33   ALT 0 - 44 U/L 15  21  32   Alk Phosphatase 38 - 126 U/L 46  39  52   Total Bilirubin 0.0 - 1.2 mg/dL 0.5  0.5  0.7   Bilirubin, Direct 0.0 - 0.3 mg/dL  0.1     Platelets 795  - need LFTs and CBC monitored every 6 months, - evaluation with imaging every 2-3 years.  - Encouraged diet/exercise for modest 10% body weight loss as treatment for hepatic steatosis -Continue to work on risk factor modification including diet exercise and control of risk factors including blood sugars. - cessation of  alcohol   Patient Care Team: Stacy Jayson MATSU, MD as PCP - General (Cardiology)  HISTORY OF PRESENT ILLNESS: 83 y.o. female with a past medical history of DM, DVT/PE on Eliquis , ILD/asthma, SLE, Sjogren's syndrome, ILD on 1-2 L [place] oxygen PRN, CAD s/p PCI, breast cancer s/p radiation therapy and others listed below presents for follow up from ER visit for melenic stools 8/26  Patient was last seen in the office 04/09/2023 by Stacy Contreras for right upper quadrant abdominal pain  Discussed the use of AI scribe software for clinical note transcription with the patient, who gave verbal consent to proceed.  History of Present Illness   Stacy Contreras is an 83 year old female who presents with chronic diarrhea and black tarry stools.  She has experienced chronic diarrhea and black tarry stools for the past two years, with stools that are loose, unformed, and occasionally black, tarry, and greasy. She has frequent bowel movements, up to eight times a day, and must plan her activities around bathroom access. She wears a pad due to the unpredictability of her bowel movements.  She has a history of abdominal discomfort and underwent an endoscopy in July of the previous year, which showed negative results for H. pylori and celiac disease but retained fluid in the stomach. A subsequent colonoscopy in September 2024 revealed tubular  adenomatous polyps and internal hemorrhoids, with no evidence of microscopic colitis.  She has tried colestipol  and Bentyl  for her symptoms. Colestipol  did not alleviate her symptoms, while Bentyl  provided some relief for stomach pain but not for the diarrhea. Imodium helps reduce the frequency of loose stools if taken daily.  She experiences episodes of severe right upper abdominal pain accompanied by nausea and projectile vomiting, which occur suddenly and are not frequent. No significant weight loss, but notes a fluctuation of five to six pounds. She does not take any  new medications or supplements.  She continues to work in [employer] despite her symptoms, which significantly impact her daily life and activities. She avoids eating when she has work commitments to prevent accidents. No days without a bowel movement and symptoms are not related to specific foods.        She  reports that she has never smoked. She has never used smokeless tobacco. She reports current alcohol use. She reports that she does not use drugs.  RELEVANT GI HISTORY, IMAGING AND LABS: Results   DIAGNOSTIC EGD: Negative for H. pylori, negative for celiac disease, retained gastric fluid (02/2023) Colonoscopy: Tubular adenomatous polyps in transverse colon, internal hemorrhoids, no microscopic colitis (04/28/2023)     CT A/P w/contrast 03/24/23: IMPRESSION: 1. No acute abnormality or explanation for abdominal pain. 2. Occasional left colonic diverticula without diverticulitis. 3. Small hiatal hernia.   EGD 02/16/23:  Path: 1. Surgical [P], duodenal - BENIGN SMALL BOWEL MUCOSA WITH NO SIGNIFICANT PATHOLOGIC CHANGES 2. Surgical [P], nodular duodenal mucosa - BENIGN SMALL BOWEL MUCOSA WITH FOVEOLAR METAPLASIA, SUGGESTIVE OF PEPTIC INJURY 3. Surgical [P], gastric - MILD REACTIVE GASTROPATHY. - NEGATIVE FOR H. PYLORI ON H&E STAIN - NO INTESTINAL METAPLASIA, DYSPLASIA, OR MALIGNANCY. 4. Surgical [P], esophageal - BENIGN SQUAMOUS MUCOSA WITH NO SPECIFIC PATHOLOGIC CHANGES - NEGATIVE FOR DYSPLASIA OR MALIGNANCY - PAS was negative for fungal organisms CBC    Component Value Date/Time   WBC 4.8 04/04/2024 1336   RBC 4.20 04/04/2024 1336   HGB 12.6 04/04/2024 1336   HCT 38.5 04/04/2024 1336   PLT 204 04/04/2024 1336   MCV 91.7 04/04/2024 1336   MCH 30.0 04/04/2024 1336   MCHC 32.7 04/04/2024 1336   RDW 13.0 04/04/2024 1336   LYMPHSABS 0.9 04/27/2020 1726   MONOABS 0.7 04/27/2020 1726   EOSABS 0.0 04/27/2020 1726   BASOSABS 0.0 04/27/2020 1726   Recent Labs     04/04/24 1336  HGB 12.6    CMP     Component Value Date/Time   NA 134 (L) 04/04/2024 1336   K 4.9 04/04/2024 1336   CL 98 04/04/2024 1336   CO2 21 (L) 04/04/2024 1336   GLUCOSE 111 (H) 04/04/2024 1336   BUN 7 (L) 04/04/2024 1336   BUN 8 03/22/2023 1245   CREATININE 0.92 04/04/2024 1336   CALCIUM 9.6 04/04/2024 1336   PROT 6.4 (L) 04/04/2024 1336   ALBUMIN 4.3 04/04/2024 1336   AST 18 04/04/2024 1336   ALT 15 04/04/2024 1336   ALKPHOS 46 04/04/2024 1336   BILITOT 0.5 04/04/2024 1336   GFRNONAA >60 04/04/2024 1336   GFRAA 49 (L) 04/29/2020 0504      Latest Ref Rng & Units 04/04/2024    1:36 PM 02/05/2023   11:19 AM 04/27/2020    5:26 PM  Hepatic Function  Total Protein 6.5 - 8.1 g/dL 6.4  6.8  7.3   Albumin 3.5 - 5.0 g/dL 4.3  4.4  3.7  AST 15 - 41 U/L 18  22  33   ALT 0 - 44 U/L 15  21  32   Alk Phosphatase 38 - 126 U/L 46  39  52   Total Bilirubin 0.0 - 1.2 mg/dL 0.5  0.5  0.7   Bilirubin, Direct 0.0 - 0.3 mg/dL  0.1        Current Medications:   Current Outpatient Medications (Endocrine & Metabolic):    metFORMIN  (GLUCOPHAGE ) 500 MG tablet, Take 500 mg by mouth 2 (two) times daily with a meal.  Current Outpatient Medications (Cardiovascular):    amLODipine  (NORVASC ) 5 MG tablet, Take 5 mg by mouth daily.   cholestyramine  (QUESTRAN ) 4 g packet, Take 1 packet (4 g total) by mouth 3 (three) times daily as needed (diarrhea). Suggest starting out once daily, can increase up to three times a day as needed, can cause constipation   isosorbide  mononitrate (IMDUR ) 30 MG 24 hr tablet, Take 1 tablet (30 mg total) by mouth daily.   losartan  (COZAAR ) 100 MG tablet, Take 100 mg by mouth at bedtime.   metoprolol  succinate (TOPROL -XL) 100 MG 24 hr tablet, Take 1 tablet (100 mg total) by mouth daily. Take with or immediately following a meal.   nitroGLYCERIN  (NITROSTAT ) 0.4 MG SL tablet, Place 1 tablet (0.4 mg total) under the tongue every 5 (five) minutes x 3 doses as needed for  chest pain (if no relief after 2nd dose, proceed to ED or call 911).   REPATHA SURECLICK 140 MG/ML SOAJ, Inject 1 mL into the skin every 14 (fourteen) days.  Current Outpatient Medications (Respiratory):    albuterol  (VENTOLIN  HFA) 108 (90 Base) MCG/ACT inhaler, Inhale 2 puffs into the lungs as needed for wheezing or shortness of breath.   fluticasone  (FLONASE ) 50 MCG/ACT nasal spray, Place 1 spray into both nostrils daily as needed for allergies or rhinitis.  Current Outpatient Medications (Analgesics):    acetaminophen  (TYLENOL ) 500 MG tablet, Take 1,000 mg by mouth 2 (two) times daily as needed for headache (pain).  Current Outpatient Medications (Hematological):    apixaban  (ELIQUIS ) 2.5 MG TABS tablet, Take 1 tablet (2.5 mg total) by mouth 2 (two) times daily.  Current Outpatient Medications (Other):    Cholecalciferol  (VITAMIN D3) 2000 units capsule, Take 2,000 Units by mouth daily.   gabapentin  (NEURONTIN ) 300 MG capsule, Take 900 mg by mouth every evening. Pt take at bedtime   metroNIDAZOLE  (FLAGYL ) 250 MG tablet, Take 1 tablet (250 mg total) by mouth 3 (three) times daily for 10 days.   mycophenolate (CELLCEPT) 500 MG tablet, Take by mouth 2 (two) times daily.   Omega-3 Fatty Acids (FISH OIL PO), Take 1,400 mg by mouth daily.   omeprazole  (PRILOSEC) 20 MG capsule, Take 20 mg by mouth daily.   ondansetron  (ZOFRAN ) 4 MG tablet, Take 1 tablet (4 mg total) by mouth 3 (three) times daily as needed for nausea or vomiting.   OXYGEN, Inhale 2 L into the lungs as needed (shortness of breath/ low oxygen stats).   PARoxetine  (PAXIL ) 20 MG tablet, Take 10 mg by mouth daily.   TURMERIC PO, Take 1 tablet by mouth daily.   zolpidem  (AMBIEN ) 5 MG tablet, Take 5 mg by mouth at bedtime as needed for sleep.   dicyclomine  (BENTYL ) 10 MG capsule, Take 1 capsule (10 mg total) by mouth 3 (three) times daily as needed for spasms. (Patient not taking: Reported on 04/14/2024)  Medical History:  Past Medical  History:  Diagnosis Date  Breast cancer (HCC)    Status post XRT   Coronary artery disease    BMS (Multi-link Vision Cobalt Chromium) to LAD 10/2012 and DES to mid LAD 12/2012   COVID-19 2021   GERD (gastroesophageal reflux disease)    History of breast cancer    Interstitial lung disease (HCC)    Associated with connective tissue disease   Mixed hyperlipidemia    Primary hypertension    Pulmonary embolus (HCC)    Also history of DVT x 2 on chronic anticoagulation   Sjogren's syndrome (HCC)    Statin intolerance    Syncope    Associated with Ambien    Systemic lupus erythematosus (HCC)    Allergies:  Allergies  Allergen Reactions   Codeine Other (See Comments), Palpitations and Shortness Of Breath    Fast heart beat (tolerates hydrocodone )  Fast heart beat (tolerates hydrocodone ), Fast heart beat (tolerates hydrocodone )   Statins Other (See Comments)    Muscle weakness   Sulfa  Antibiotics Rash    Takes Bactrim      Surgical History:  She  has a past surgical history that includes Video bronchoscopy (Bilateral, 12/19/2015); Cholecystectomy; Appendectomy; Cardiac catheterization; Coronary angioplasty; Colonoscopy; Esophagogastroduodenoscopy; Eye surgery; Video assisted thoracoscopy (Right, 05/08/2016); Lung biopsy (Right, 05/08/2016); Video bronchoscopy (N/A, 05/08/2016); Abdominal hysterectomy; Replacement total knee (Left); and Breast lumpectomy (Right). Family History:  Her family history includes Breast cancer in her daughter and sister; Colon cancer in her father and paternal grandfather; Diabetes in her mother; Stroke in her mother.  REVIEW OF SYSTEMS  : All other systems reviewed and negative except where noted in the History of Present Illness.  PHYSICAL EXAM: BP 124/60   Pulse 60   Ht 5' 5 (1.651 m)   Wt 164 lb (74.4 kg)   BMI 27.29 kg/m  Physical Exam   GENERAL APPEARANCE: Well nourished, in no apparent distress. HEENT: No cervical lymphadenopathy, unremarkable  thyroid, sclerae anicteric, conjunctiva pink. RESPIRATORY: Respiratory effort normal, breath sounds equal bilaterally without rales, rhonchi, or wheezing. CARDIO: Regular rate and rhythm with no murmurs, rubs, or gallops, peripheral pulses intact. ABDOMEN: Soft, non-distended, active bowel sounds in all four quadrants, mild tenderness in the left lower quadrant and gastric region, no significant tenderness around the umbilical region, no rebound, no mass appreciated. RECTAL: Declines. MUSCULOSKELETAL: Full range of motion, normal gait, without edema. SKIN: Dry, intact without rashes or lesions. No jaundice. NEURO: Alert, oriented, no focal deficits. PSYCH: Cooperative, normal mood and affect.      Alan JONELLE Coombs, PA-C 3:16 PM

## 2024-04-14 ENCOUNTER — Ambulatory Visit (INDEPENDENT_AMBULATORY_CARE_PROVIDER_SITE_OTHER): Admitting: Physician Assistant

## 2024-04-14 ENCOUNTER — Encounter: Payer: Self-pay | Admitting: Physician Assistant

## 2024-04-14 ENCOUNTER — Other Ambulatory Visit (INDEPENDENT_AMBULATORY_CARE_PROVIDER_SITE_OTHER)

## 2024-04-14 VITALS — BP 124/60 | HR 60 | Ht 65.0 in | Wt 164.0 lb

## 2024-04-14 DIAGNOSIS — D509 Iron deficiency anemia, unspecified: Secondary | ICD-10-CM

## 2024-04-14 DIAGNOSIS — Z800 Family history of malignant neoplasm of digestive organs: Secondary | ICD-10-CM | POA: Diagnosis not present

## 2024-04-14 DIAGNOSIS — R109 Unspecified abdominal pain: Secondary | ICD-10-CM

## 2024-04-14 DIAGNOSIS — K921 Melena: Secondary | ICD-10-CM | POA: Diagnosis not present

## 2024-04-14 DIAGNOSIS — K580 Irritable bowel syndrome with diarrhea: Secondary | ICD-10-CM

## 2024-04-14 DIAGNOSIS — K760 Fatty (change of) liver, not elsewhere classified: Secondary | ICD-10-CM | POA: Diagnosis not present

## 2024-04-14 DIAGNOSIS — R112 Nausea with vomiting, unspecified: Secondary | ICD-10-CM

## 2024-04-14 DIAGNOSIS — R197 Diarrhea, unspecified: Secondary | ICD-10-CM

## 2024-04-14 DIAGNOSIS — K219 Gastro-esophageal reflux disease without esophagitis: Secondary | ICD-10-CM

## 2024-04-14 DIAGNOSIS — Z860101 Personal history of adenomatous and serrated colon polyps: Secondary | ICD-10-CM

## 2024-04-14 LAB — IBC + FERRITIN
Ferritin: 38.7 ng/mL (ref 10.0–291.0)
Iron: 85 ug/dL (ref 42–145)
Saturation Ratios: 20.5 % (ref 20.0–50.0)
TIBC: 414.4 ug/dL (ref 250.0–450.0)
Transferrin: 296 mg/dL (ref 212.0–360.0)

## 2024-04-14 MED ORDER — CHOLESTYRAMINE 4 G PO PACK: 4.0000 g | PACK | Freq: Three times a day (TID) | ORAL | 0 refills | Status: DC | PRN

## 2024-04-14 MED ORDER — METRONIDAZOLE 250 MG PO TABS: 250.0000 mg | ORAL_TABLET | Freq: Three times a day (TID) | ORAL | 0 refills | Status: AC

## 2024-04-14 NOTE — Progress Notes (Signed)
 I agree with the assessment and plan as outlined by Ms. Craig.

## 2024-04-14 NOTE — Patient Instructions (Addendum)
 _______________________________________________________  If your blood pressure at your visit was 140/90 or greater, please contact your primary care physician to follow up on this.  _______________________________________________________  If you are age 81 or older, your body mass index should be between 23-30. Your Body mass index is 27.29 kg/m. If this is out of the aforementioned range listed, please consider follow up with your Primary Care Provider.  If you are age 2 or younger, your body mass index should be between 19-25. Your Body mass index is 27.29 kg/m. If this is out of the aformentioned range listed, please consider follow up with your Primary Care Provider.   ________________________________________________________  The [facility] providers would like to encourage you to use MYCHART to communicate with providers for non-urgent requests or questions.  Due to long hold times on the telephone, sending your provider a message by [facility] may be a faster and more efficient way to get a response.  Please allow 48 business hours for a response.  Please remember that this is for non-urgent requests.  _______________________________________________________  [facility] is using a team-based approach to care.  Your team is made up of your doctor and two to three APPS. Our APPS (Nurse Practitioners and Physician Assistants) work with your physician to ensure care continuity for you. They are fully qualified to address your health concerns and develop a treatment plan. They communicate directly with your gastroenterologist to care for you. Seeing the Advanced Practice Practitioners on your physician's team can help you by facilitating care more promptly, often allowing for earlier appointments, access to diagnostic testing, procedures, and other specialty referrals.   Your provider has requested that you go to the basement level for lab work before leaving today. Press B on the  elevator. The lab is located at the first door on the left as you exit the elevator.  Due to recent changes in healthcare laws, you may see the results of your imaging and laboratory studies on MyChart before your provider has had a chance to review them.  We understand that in some cases there may be results that are confusing or concerning to you. Not all laboratory results come back in the same time frame and the provider may be waiting for multiple results in order to interpret others.  Please give us  48 hours in order for your provider to thoroughly review all the results before contacting the office for clarification of your results.   We have sent the following medications to your pharmacy for you to pick up at your convenience:  START: Questran , Flagyl    - Can try loperamide 4 mg initially, then 2 mg after each unformed stool for =2 days, with a maximum of 16 mg/day.  - If loperamide is not working, you could try bismuth salicylate (Pepto-Bismol) 30 mL or two tablets every 30 minutes for eight doses. Pepto-Bismol may make your stools black.   Go to the ER if any severe abdominal pain, fever, or weakness  FIBER SUPPLEMENT You can do metamucil or fibercon once or twice a day but if this causes gas/bloating please switch to Benefiber or Citracel.  Fiber is good for constipation/diarrhea/irritable bowel syndrome.  It can also help with weight loss and can help lower your bad cholesterol (LDL).  Please do 1 TBSP in the morning in water, coffee, or tea.  It can take up to a month before you can see a difference with your bowel movements.  It is cheapest from [employer], [employer], [employer].  Small intestinal bacterial overgrowth (SIBO) occurs when there is an abnormal increase in the overall bacterial population in the small intestine -- particularly types of bacteria not commonly found in that part of the digestive tract. Small intestinal bacterial overgrowth (SIBO) commonly results when a  circumstance -- such as surgery or disease -- slows the passage of food and waste products in the digestive tract, creating a breeding ground for bacteria.  Signs and symptoms of SIBO often include: Loss of appetite Abdominal pain Nausea Bloating An uncomfortable feeling of fullness after eating Diarrhea or constipation, depending on the type of gas produced  What foods trigger SIBO? While foods aren't the original cause of SIBO, certain foods do encourage the overgrowth of the wrong bacteria in your small intestine. If you're feeding them their favorite foods, they're going to grow more, and that will trigger more of your SIBO symptoms. By the same token, you can help reduce the overgrowth by starving the problematic bacteria of their favorite foods. This strategy has led to a number of proposed SIBO eating plans. The plans vary, and so do individual results. But in general, they tend to recommend limiting carbohydrates.  These include: Sugars and sweeteners. Fruits and starchy vegetables. Dairy products. Grains.  There is a test for this we can do called a breath test, if you are positive we will treat you with an antibiotic to see if it helps.  Your symptoms are very suspicious for this condition, as discussed, we will start you on an antibiotic to see if this helps.    Here some information about pelvic floor dysfunction. We could also refer to pelvic floor physical therapy.   Pelvic Floor Dysfunction, Female Pelvic floor dysfunction (PFD) is a condition that results when the group of muscles and connective tissues that support the organs in the pelvis (pelvic floor muscles) do not work well. These muscles and their connections form a sling that supports the colon and bladder. In women, they also support the uterus. PFD causes pelvic floor muscles to be too weak, too tight, or both. In PFD, muscle movements are not coordinated. This may cause bowel or bladder problems. It may also  cause pain. What are the causes? This condition may be caused by an injury to the pelvic area or by a weakening of pelvic muscles. This often results from pregnancy and childbirth or other types of strain. In many cases, the exact cause is not known. What increases the risk? The following factors may make you more likely to develop this condition: Having chronic bladder tissue inflammation (interstitial cystitis). Being an older person. Being overweight. History of radiation treatment for cancer in the pelvic region. Previous pelvic surgery, such as removal of the uterus (hysterectomy). What are the signs or symptoms? Symptoms of this condition vary and may include: Bladder symptoms, such as: Trouble starting urination and emptying the bladder. Frequent urinary tract infections. Leaking urine when coughing, laughing, or exercising (stress incontinence). Having to pass urine urgently or frequently. Pain when passing urine. Bowel symptoms, such as: Constipation. Urgent or frequent bowel movements. Incomplete bowel movements. Painful bowel movements. Leaking stool or gas. Unexplained genital or rectal pain. Genital or rectal muscle spasms. Low back pain. Other symptoms may include: A heavy, full, or aching feeling in the vagina. A bulge that protrudes into the vagina. Pain during or after sex. How is this diagnosed? This condition may be diagnosed based on: Your symptoms and medical history. A physical exam. During the exam, your health  care provider may check your pelvic muscles for tightness, spasm, pain, or weakness. This may include a rectal exam and a pelvic exam. In some cases, you may have diagnostic tests, such as: Electrical muscle function tests. Urine flow testing. X-ray tests of bowel function. Ultrasound of the pelvic organs. How is this treated? Treatment for this condition depends on the symptoms. Treatment options include: Physical therapy. This may include  Kegel exercises to help relax or strengthen the pelvic floor muscles. Biofeedback. This type of therapy provides feedback on how tight your pelvic floor muscles are so that you can learn to control them. Internal or external massage therapy. A treatment that involves electrical stimulation of the pelvic floor muscles to help control pain (transcutaneous electrical nerve stimulation, or TENS). Sound wave therapy (ultrasound) to reduce muscle spasms. Medicines, such as: Muscle relaxants. Bladder control medicines. Surgery to reconstruct or support pelvic floor muscles may be an option if other treatments do not help. Follow these instructions at home: Activity Do your usual activities as told by your health care provider. Ask your health care provider if you should modify any activities. Do pelvic floor strengthening or relaxing exercises at home as told by your physical therapist. Lifestyle Maintain a healthy weight. Eat foods that are high in fiber, such as beans, whole grains, and fresh fruits and vegetables. Limit foods that are high in fat and processed sugars, such as fried or sweet foods. Manage stress with relaxation techniques such as yoga or meditation. General instructions If you have problems with leakage: Use absorbable pads or wear padded underwear. Wash frequently with mild soap. Keep your genital and anal area as clean and dry as possible. Ask your health care provider if you should try a barrier cream to prevent skin irritation. Take warm baths to relieve pelvic muscle tension or spasms. Take over-the-counter and prescription medicines only as told by your health care provider. Keep all follow-up visits. How is this prevented? The cause of PFD is not always known, but there are a few things you can do to reduce the risk of developing this condition, including: Staying at a healthy weight. Getting regular exercise. Managing stress. Contact a health care provider  if: Your symptoms are not improving with home care. You have signs or symptoms of PFD that get worse at home. You develop new signs or symptoms. You have signs of a urinary tract infection, such as: Fever. Chills. Increased urinary frequency. A burning feeling when urinating. You have not had a bowel movement in 3 days (constipation). Summary Pelvic floor dysfunction results when the muscles and connective tissues in your pelvic floor do not work well. These muscles and their connections form a sling that supports your colon and bladder. In women, they also support the uterus. PFD may be caused by an injury to the pelvic area or by a weakening of pelvic muscles. PFD causes pelvic floor muscles to be too weak, too tight, or a combination of both. Symptoms may vary from person to person. In most cases, PFD can be treated with physical therapies and medicines. Surgery may be an option if other treatments do not help. This information is not intended to replace advice given to you by your health care provider. Make sure you discuss any questions you have with your health care provider. Document Revised: 12/04/2020 Document Reviewed: 12/04/2020 Elsevier Patient Education  2022 [employer]  Thank you for entrusting me with your care and choosing [facility].  Alan Coombs, PA-C

## 2024-04-16 ENCOUNTER — Ambulatory Visit: Payer: Self-pay | Admitting: Physician Assistant

## 2024-04-19 ENCOUNTER — Other Ambulatory Visit

## 2024-04-19 DIAGNOSIS — R197 Diarrhea, unspecified: Secondary | ICD-10-CM

## 2024-04-20 ENCOUNTER — Ambulatory Visit (INDEPENDENT_AMBULATORY_CARE_PROVIDER_SITE_OTHER)

## 2024-04-20 DIAGNOSIS — K921 Melena: Secondary | ICD-10-CM

## 2024-04-20 DIAGNOSIS — D509 Iron deficiency anemia, unspecified: Secondary | ICD-10-CM | POA: Diagnosis not present

## 2024-04-20 DIAGNOSIS — R197 Diarrhea, unspecified: Secondary | ICD-10-CM | POA: Diagnosis not present

## 2024-04-20 LAB — HEMOCCULT SLIDES (X 3 CARDS)
Fecal Occult Blood: NEGATIVE
OCCULT 1: NEGATIVE
OCCULT 2: NEGATIVE
OCCULT 3: NEGATIVE
OCCULT 4: NEGATIVE
OCCULT 5: NEGATIVE

## 2024-04-22 LAB — FECAL FAT, QUALITATIVE
Fat Qual Neutral, Stl: NORMAL
Fat Qual Total, Stl: NORMAL

## 2024-04-25 LAB — PANCREATIC ELASTASE, FECAL: Pancreatic Elastase-1, Stool: 732 ug/g (ref >200)

## 2024-05-10 ENCOUNTER — Telehealth: Payer: Self-pay | Admitting: Pharmacy Technician

## 2024-05-10 NOTE — Telephone Encounter (Signed)
   Hi, we received this from the pharmacy. I scanned in media as well. Thank you

## 2024-05-12 ENCOUNTER — Telehealth (HOSPITAL_BASED_OUTPATIENT_CLINIC_OR_DEPARTMENT_OTHER): Payer: Self-pay | Admitting: *Deleted

## 2024-05-12 NOTE — Telephone Encounter (Signed)
 Pharmacy please advise on holding Eliquis  prior to  RTKA - RIGHT TOTAL KNEE ARTHROPLASTY  scheduled for TBD. Last labs 05/09/2024. Thank you.

## 2024-05-12 NOTE — Telephone Encounter (Signed)
   Pre-operative Risk Assessment    Patient Name: JAIDE HILLENBURG  DOB: May 13, 1941 MRN: [id number]   Date of last office visit: 12/03/23 DR. MCDOWELL  Date of next office visit: NONE  NOTES ON THE LETTER ARE: I recommend she see cardiology to discuss a IVC filter prior to her surgery given her risk of several DVT's and a PE. ..SABRASABRAPlease advise if patient needs IVC filter placed, thanks  See clearance request below. This letter has also been faxed to DR. McDowell    Request for Surgical Clearance    Procedure:  RTKA - RIGHT TOTAL KNEE ARTHROPLASTY  Date of Surgery:  Clearance TBD                                Surgeon:  DR. THORNTON Surgeon's Group or Practice Name:  [facility]   Phone number:  [phone]; ATTN: NURSE CASE MANAGER  Fax number:  [phone]   Type of Clearance Requested:   - Medical  - Pharmacy:  Hold Apixaban  (Eliquis )     Type of Anesthesia:  General    Additional requests/questions:    Bonney Niels Jest   05/12/2024, 1:25 PM

## 2024-05-12 NOTE — Telephone Encounter (Signed)
 Dr. Debera  You saw this patient on 12/03/2023. Per protocol we request that you comment on his cardiac risk to proceed with RTKA - RIGHT TOTAL KNEE ARTHROPLASTY planned for TBD.   The pre-op clearance request stated: NOTES ON THE LETTER ARE: I recommend she see cardiology to discuss a IVC filter prior to her surgery given her risk of several DVT's and a PE. ..SABRASABRAPlease advise if patient needs IVC filter placed, thanks   Please send your comment to P CV Pre-Op Pool.  Thank you, Lamarr Satterfield DNP, ANP, AACC.

## 2024-05-12 NOTE — Telephone Encounter (Signed)
 I will fax notes per preop APP Lamarr Satterfield, DNP ans notes pre Dr. Debera.

## 2024-05-12 NOTE — Telephone Encounter (Signed)
   Patient Name: Stacy Contreras  DOB: 21-Jun-1941 MRN: [id number]  Primary Cardiologist: None  Chart reviewed as part of pre-operative protocol coverage. Given past medical history and time since last visit, based on ACC/AHA guidelines, [name] is at acceptable risk for the planned procedure without further cardiovascular testing.   Per Dr.McDowell 05/12/2024 Chart reviewed. I met Stacy Contreras in April, previously followed at [facility]. She was clinically stable at that time from a cardiac perspective and if that remains the case now I suspect she can continue with plans for knee replacement at an acceptable perioperative cardiovascular risk. As far as the question regarding prophylactic implantation of an IVC filter prior to knee surgery (with prior history of DVT and PE on chronic anticoagulation), it is not clear to me that any guidelines would recommend this, especially for a brief hold on Eliquis  of only 48 hours prior to surgery and anticoagulation thereafter. Having an IVC filter in place does not reduce the risk of recurrent DVT, and as long as she starts back on anticoagulation postoperatively, the chance of a clinically significant pulmonary embolus should still be low.   The patient was advised that if she develops new symptoms prior to surgery to contact our office to arrange for a follow-up visit, and she verbalized understanding.  I will route this recommendation to the requesting party via Epic fax function and remove from pre-op pool.  Please call with questions.  Lamarr Satterfield, NP 05/12/2024, 4:56 PM

## 2024-05-16 NOTE — Telephone Encounter (Signed)
 We have no information on history of DVT's and PE.  If her surgery is to have spinal anesthesia she will need a 72 hour hold.  If only general anesthesia, 48 hour is acceptable.    Will need to defer to Dr. Debera or previous treating MD to determine if okay to hold.   CrCl 56 Platelet count 204

## 2024-06-06 ENCOUNTER — Other Ambulatory Visit: Payer: Self-pay | Admitting: Internal Medicine

## 2024-07-12 ENCOUNTER — Encounter: Payer: Self-pay | Admitting: Cardiology

## 2024-07-12 DIAGNOSIS — R413 Other amnesia: Secondary | ICD-10-CM

## 2024-07-23 ENCOUNTER — Other Ambulatory Visit: Payer: Self-pay | Admitting: Cardiology

## 2024-07-24 NOTE — Telephone Encounter (Signed)
 Prescription refill request for Eliquis  received. Indication:dvt Last office visit:4/25 Scr: 0.7  9/25 Age:83 Weight:74.4  kg  Prescription refilled

## 2024-07-27 ENCOUNTER — Encounter: Payer: Self-pay | Admitting: Nurse Practitioner

## 2024-07-27 ENCOUNTER — Ambulatory Visit: Attending: Nurse Practitioner | Admitting: Nurse Practitioner

## 2024-07-27 VITALS — BP 122/60 | HR 64 | Ht 64.5 in | Wt 157.0 lb

## 2024-07-27 DIAGNOSIS — E876 Hypokalemia: Secondary | ICD-10-CM | POA: Insufficient documentation

## 2024-07-27 DIAGNOSIS — E785 Hyperlipidemia, unspecified: Secondary | ICD-10-CM | POA: Diagnosis present

## 2024-07-27 DIAGNOSIS — Z86711 Personal history of pulmonary embolism: Secondary | ICD-10-CM | POA: Insufficient documentation

## 2024-07-27 DIAGNOSIS — I82409 Acute embolism and thrombosis of unspecified deep veins of unspecified lower extremity: Secondary | ICD-10-CM | POA: Insufficient documentation

## 2024-07-27 DIAGNOSIS — I2510 Atherosclerotic heart disease of native coronary artery without angina pectoris: Secondary | ICD-10-CM | POA: Diagnosis present

## 2024-07-27 DIAGNOSIS — I10 Essential (primary) hypertension: Secondary | ICD-10-CM | POA: Diagnosis present

## 2024-07-27 DIAGNOSIS — R079 Chest pain, unspecified: Secondary | ICD-10-CM | POA: Diagnosis present

## 2024-07-27 DIAGNOSIS — Z95828 Presence of other vascular implants and grafts: Secondary | ICD-10-CM | POA: Insufficient documentation

## 2024-07-27 DIAGNOSIS — Z79899 Other long term (current) drug therapy: Secondary | ICD-10-CM | POA: Diagnosis present

## 2024-07-27 DIAGNOSIS — I25119 Atherosclerotic heart disease of native coronary artery with unspecified angina pectoris: Secondary | ICD-10-CM

## 2024-07-27 MED ORDER — POTASSIUM CHLORIDE CRYS ER 20 MEQ PO TBCR: 40.0000 meq | EXTENDED_RELEASE_TABLET | Freq: Every day | ORAL | 6 refills | Status: AC

## 2024-07-27 NOTE — Progress Notes (Unsigned)
°  Cardiology Office Note   Date:  07/27/2024  ID:  Stacy Contreras, Stacy Contreras Dec 06, 1940, MRN [id number] PCP: Debera Jayson MATSU, MD  [facility] Providers Cardiologist:  None { Click to update primary MD,subspecialty MD or APP then REFRESH:1}    History of Present Illness Stacy Contreras is a 83 y.o. female with a PMH of CAD, HTN, HLD (statin intolerance), hx of recurrent DVT with PE, hx of syncope r/t Ambien  use, SLE, ILD, hx of breast cancer, who presents today for evaluation for IVC filter.   Last seen by Dr. Debera on December 03, 2023. Pt did notice some episodes of angina and NTG use within a year, rare in occurrence, was otherwise doing well.   Pt is requesting to see cardiology about a possible IVC filter d/t recurrent hx of DVT with PE. Dr. Debera was consulted and sated she was at an acceptable risk for future knee surgery and that pt did not require having an IVC filter placed as long as she returned to taking Eliquis  after the procedure.     ROS: ***  Studies Reviewed      *** Risk Assessment/Calculations {Does this patient have ATRIAL FIBRILLATION?:[phone]} No BP recorded.  {Refresh Note OR Click here to enter BP  :1}***       Physical Exam VS:  There were no vitals taken for this visit.       Wt Readings from Last 3 Encounters:  04/14/24 164 lb (74.4 kg)  04/04/24 160 lb (72.6 kg)  12/03/23 159 lb 3.2 oz (72.2 kg)    GEN: Well nourished, well developed in no acute distress NECK: No JVD; No carotid bruits CARDIAC: ***RRR, no murmurs, rubs, gallops RESPIRATORY:  Clear to auscultation without rales, wheezing or rhonchi  ABDOMEN: Soft, non-tender, non-distended EXTREMITIES:  No edema; No deformity   ASSESSMENT AND PLAN ***    {Are you ordering a CV Procedure (e.g. stress test, cath, DCCV, TEE, etc)?   Press F2        :[id number]}  Dispo: ***  Signed, Almarie Crate, NP

## 2024-07-27 NOTE — Patient Instructions (Signed)
 Medication Instructions:   Stop Atenolol  (Tenormin ) Begin Potassium 40meq daily Continue all other medications.     Labwork:  BMET - order given  Please do in 1-2 weeks Office will contact with results via phone, letter or mychart.     Testing/Procedures:  none  Follow-Up:  6 months   Any Other Special Instructions Will Be Listed Below (If Applicable).   If you need a refill on your cardiac medications before your next appointment, please call your pharmacy.

## 2024-07-28 ENCOUNTER — Encounter: Payer: Self-pay | Admitting: *Deleted

## 2024-07-28 NOTE — Progress Notes (Signed)
 Patient notified via mychart

## 2024-08-18 LAB — BASIC METABOLIC PANEL WITH GFR
BUN/Creatinine Ratio: 13 (ref 12–28)
BUN: 12 mg/dL (ref 8–27)
CO2: 20 mmol/L (ref 20–29)
Calcium: 9.6 mg/dL (ref 8.7–10.3)
Chloride: 97 mmol/L (ref 96–106)
Creatinine, Ser: 0.93 mg/dL (ref 0.57–1.00)
Glucose: 106 mg/dL — ABNORMAL HIGH (ref 70–99)
Potassium: 5.5 mmol/L — ABNORMAL HIGH (ref 3.5–5.2)
Sodium: 130 mmol/L — ABNORMAL LOW (ref 134–144)
eGFR: 62 mL/min/1.73

## 2024-08-19 ENCOUNTER — Ambulatory Visit: Payer: Self-pay | Admitting: Nurse Practitioner

## 2024-08-21 ENCOUNTER — Other Ambulatory Visit: Payer: Self-pay

## 2024-08-21 ENCOUNTER — Telehealth: Payer: Self-pay | Admitting: Nurse Practitioner

## 2024-08-21 DIAGNOSIS — Z79899 Other long term (current) drug therapy: Secondary | ICD-10-CM

## 2024-08-21 NOTE — Telephone Encounter (Signed)
" °*  STAT* If patient is at the pharmacy, call can be transferred to refill team.   1. Which medications need to be refilled? (please list name of each medication and dose if known)   amLODipine  (NORVASC ) 5 MG tablet     2. Would you like to learn more about the convenience, safety, & potential cost savings by using the [facility]? No   3. Are you open to using the [facility] (Type [facility].) No   4. Which pharmacy/location (including street and city if local pharmacy) is medication to be sent to?  [id number] [place], [place] - [address]   5. Do they need a 30 day or 90 day supply? 90 day  Pt is out of medication  "

## 2024-08-22 MED ORDER — AMLODIPINE BESYLATE 5 MG PO TABS: 5.0000 mg | ORAL_TABLET | Freq: Every day | ORAL | 1 refills | Status: AC

## 2024-08-22 NOTE — Telephone Encounter (Signed)
 Refill sent

## 2024-08-25 ENCOUNTER — Other Ambulatory Visit: Payer: Self-pay | Admitting: Cardiology

## 2024-08-29 ENCOUNTER — Ambulatory Visit: Admitting: Cardiology

## 2024-10-27 ENCOUNTER — Ambulatory Visit

## 2025-01-25 ENCOUNTER — Ambulatory Visit: Admitting: Nurse Practitioner
